# Patient Record
Sex: Female | Born: 1935 | Race: White | Hispanic: No | Marital: Married | Smoking: Never smoker
Health system: Southern US, Community
[De-identification: ages and names within clinical notes are randomized; demographics above are authoritative.]

## PROBLEM LIST (undated history)

## (undated) DIAGNOSIS — E039 Hypothyroidism, unspecified: Secondary | ICD-10-CM

## (undated) DIAGNOSIS — I10 Essential (primary) hypertension: Secondary | ICD-10-CM

## (undated) DIAGNOSIS — D649 Anemia, unspecified: Secondary | ICD-10-CM

## (undated) DIAGNOSIS — I350 Nonrheumatic aortic (valve) stenosis: Secondary | ICD-10-CM

## (undated) DIAGNOSIS — K8020 Calculus of gallbladder without cholecystitis without obstruction: Secondary | ICD-10-CM

## (undated) DIAGNOSIS — I4891 Unspecified atrial fibrillation: Secondary | ICD-10-CM

## (undated) DIAGNOSIS — E785 Hyperlipidemia, unspecified: Secondary | ICD-10-CM

## (undated) DIAGNOSIS — E669 Obesity, unspecified: Secondary | ICD-10-CM

## (undated) DIAGNOSIS — R413 Other amnesia: Secondary | ICD-10-CM

## (undated) DIAGNOSIS — I499 Cardiac arrhythmia, unspecified: Secondary | ICD-10-CM

## (undated) DIAGNOSIS — Z8719 Personal history of other diseases of the digestive system: Secondary | ICD-10-CM

## (undated) DIAGNOSIS — K219 Gastro-esophageal reflux disease without esophagitis: Secondary | ICD-10-CM

## (undated) DIAGNOSIS — E0590 Thyrotoxicosis, unspecified without thyrotoxic crisis or storm: Secondary | ICD-10-CM

## (undated) DIAGNOSIS — J302 Other seasonal allergic rhinitis: Secondary | ICD-10-CM

## (undated) DIAGNOSIS — M1990 Unspecified osteoarthritis, unspecified site: Secondary | ICD-10-CM

## (undated) DIAGNOSIS — G4730 Sleep apnea, unspecified: Secondary | ICD-10-CM

## (undated) HISTORY — DX: Nonrheumatic aortic (valve) stenosis: I35.0

## (undated) HISTORY — PX: BREAST SURGERY: SHX581

## (undated) HISTORY — PX: OTHER SURGICAL HISTORY: SHX169

## (undated) HISTORY — DX: Thyrotoxicosis, unspecified without thyrotoxic crisis or storm: E05.90

## (undated) HISTORY — DX: Gastro-esophageal reflux disease without esophagitis: K21.9

## (undated) HISTORY — DX: Unspecified osteoarthritis, unspecified site: M19.90

## (undated) HISTORY — DX: Other seasonal allergic rhinitis: J30.2

## (undated) HISTORY — DX: Cardiac arrhythmia, unspecified: I49.9

## (undated) HISTORY — DX: Essential (primary) hypertension: I10

## (undated) HISTORY — DX: Other amnesia: R41.3

---

## 1959-01-09 HISTORY — PX: THYROIDECTOMY: SHX17

## 1997-05-11 ENCOUNTER — Ambulatory Visit (HOSPITAL_COMMUNITY): Admission: RE | Admit: 1997-05-11 | Discharge: 1997-05-11 | Payer: Self-pay | Admitting: *Deleted

## 1997-07-07 ENCOUNTER — Other Ambulatory Visit: Admission: RE | Admit: 1997-07-07 | Discharge: 1997-07-07 | Payer: Self-pay | Admitting: *Deleted

## 1998-08-03 ENCOUNTER — Other Ambulatory Visit: Admission: RE | Admit: 1998-08-03 | Discharge: 1998-08-03 | Payer: Self-pay | Admitting: *Deleted

## 1998-08-04 ENCOUNTER — Other Ambulatory Visit: Admission: RE | Admit: 1998-08-04 | Discharge: 1998-08-04 | Payer: Self-pay | Admitting: *Deleted

## 1998-08-04 ENCOUNTER — Encounter (INDEPENDENT_AMBULATORY_CARE_PROVIDER_SITE_OTHER): Payer: Self-pay

## 1998-12-15 ENCOUNTER — Encounter: Payer: Self-pay | Admitting: Family Medicine

## 1998-12-15 ENCOUNTER — Encounter: Admission: RE | Admit: 1998-12-15 | Discharge: 1998-12-15 | Payer: Self-pay | Admitting: Family Medicine

## 1999-08-21 ENCOUNTER — Other Ambulatory Visit: Admission: RE | Admit: 1999-08-21 | Discharge: 1999-08-21 | Payer: Self-pay | Admitting: *Deleted

## 1999-12-18 ENCOUNTER — Encounter: Admission: RE | Admit: 1999-12-18 | Discharge: 1999-12-18 | Payer: Self-pay | Admitting: Family Medicine

## 1999-12-18 ENCOUNTER — Encounter: Payer: Self-pay | Admitting: Family Medicine

## 2000-09-19 ENCOUNTER — Other Ambulatory Visit: Admission: RE | Admit: 2000-09-19 | Discharge: 2000-09-19 | Payer: Self-pay | Admitting: Family Medicine

## 2000-12-20 ENCOUNTER — Encounter: Admission: RE | Admit: 2000-12-20 | Discharge: 2000-12-20 | Payer: Self-pay | Admitting: Family Medicine

## 2000-12-20 ENCOUNTER — Encounter: Payer: Self-pay | Admitting: Family Medicine

## 2001-06-26 ENCOUNTER — Ambulatory Visit (HOSPITAL_COMMUNITY): Admission: RE | Admit: 2001-06-26 | Discharge: 2001-06-26 | Payer: Self-pay | Admitting: Gastroenterology

## 2001-06-26 ENCOUNTER — Encounter (INDEPENDENT_AMBULATORY_CARE_PROVIDER_SITE_OTHER): Payer: Self-pay | Admitting: *Deleted

## 2001-11-28 ENCOUNTER — Encounter: Admission: RE | Admit: 2001-11-28 | Discharge: 2001-11-28 | Payer: Self-pay | Admitting: Family Medicine

## 2001-11-28 ENCOUNTER — Encounter: Payer: Self-pay | Admitting: Family Medicine

## 2001-12-24 ENCOUNTER — Encounter: Admission: RE | Admit: 2001-12-24 | Discharge: 2001-12-24 | Payer: Self-pay | Admitting: Family Medicine

## 2001-12-24 ENCOUNTER — Encounter: Payer: Self-pay | Admitting: Family Medicine

## 2002-03-23 ENCOUNTER — Encounter: Admission: RE | Admit: 2002-03-23 | Discharge: 2002-03-23 | Payer: Self-pay | Admitting: Family Medicine

## 2002-03-23 ENCOUNTER — Encounter: Payer: Self-pay | Admitting: Family Medicine

## 2002-12-31 ENCOUNTER — Encounter: Admission: RE | Admit: 2002-12-31 | Discharge: 2002-12-31 | Payer: Self-pay | Admitting: Family Medicine

## 2004-01-07 ENCOUNTER — Encounter: Admission: RE | Admit: 2004-01-07 | Discharge: 2004-01-07 | Payer: Self-pay | Admitting: Family Medicine

## 2005-01-25 ENCOUNTER — Encounter: Admission: RE | Admit: 2005-01-25 | Discharge: 2005-01-25 | Payer: Self-pay | Admitting: Family Medicine

## 2006-02-08 ENCOUNTER — Encounter: Admission: RE | Admit: 2006-02-08 | Discharge: 2006-02-08 | Payer: Self-pay | Admitting: Family Medicine

## 2007-02-26 ENCOUNTER — Encounter: Admission: RE | Admit: 2007-02-26 | Discharge: 2007-02-26 | Payer: Self-pay | Admitting: Family Medicine

## 2007-05-16 ENCOUNTER — Ambulatory Visit (HOSPITAL_COMMUNITY): Admission: RE | Admit: 2007-05-16 | Discharge: 2007-05-16 | Payer: Self-pay | Admitting: Cardiology

## 2007-06-19 ENCOUNTER — Ambulatory Visit (HOSPITAL_COMMUNITY): Admission: RE | Admit: 2007-06-19 | Discharge: 2007-06-19 | Payer: Self-pay | Admitting: Cardiology

## 2008-03-09 ENCOUNTER — Encounter: Admission: RE | Admit: 2008-03-09 | Discharge: 2008-03-09 | Payer: Self-pay | Admitting: Family Medicine

## 2009-03-15 ENCOUNTER — Encounter: Admission: RE | Admit: 2009-03-15 | Discharge: 2009-03-15 | Payer: Self-pay | Admitting: Family Medicine

## 2009-04-22 ENCOUNTER — Encounter: Admission: RE | Admit: 2009-04-22 | Discharge: 2009-04-22 | Payer: Self-pay | Admitting: Cardiology

## 2009-05-04 ENCOUNTER — Encounter: Payer: Self-pay | Admitting: Internal Medicine

## 2009-05-23 ENCOUNTER — Ambulatory Visit (HOSPITAL_COMMUNITY): Admission: RE | Admit: 2009-05-23 | Discharge: 2009-05-23 | Payer: Self-pay | Admitting: Cardiology

## 2009-05-23 ENCOUNTER — Encounter: Payer: Self-pay | Admitting: Internal Medicine

## 2009-05-30 ENCOUNTER — Encounter: Payer: Self-pay | Admitting: Internal Medicine

## 2009-06-15 ENCOUNTER — Encounter: Payer: Self-pay | Admitting: Internal Medicine

## 2009-06-15 DIAGNOSIS — D649 Anemia, unspecified: Secondary | ICD-10-CM

## 2009-06-15 DIAGNOSIS — I4891 Unspecified atrial fibrillation: Secondary | ICD-10-CM

## 2009-06-15 DIAGNOSIS — E669 Obesity, unspecified: Secondary | ICD-10-CM

## 2009-06-15 DIAGNOSIS — E785 Hyperlipidemia, unspecified: Secondary | ICD-10-CM

## 2009-06-15 DIAGNOSIS — I482 Chronic atrial fibrillation, unspecified: Secondary | ICD-10-CM | POA: Insufficient documentation

## 2009-06-15 DIAGNOSIS — I10 Essential (primary) hypertension: Secondary | ICD-10-CM

## 2009-06-15 DIAGNOSIS — E039 Hypothyroidism, unspecified: Secondary | ICD-10-CM | POA: Insufficient documentation

## 2009-06-15 HISTORY — DX: Hypothyroidism, unspecified: E03.9

## 2009-06-15 HISTORY — DX: Essential (primary) hypertension: I10

## 2009-06-15 HISTORY — DX: Obesity, unspecified: E66.9

## 2009-06-15 HISTORY — DX: Hyperlipidemia, unspecified: E78.5

## 2009-06-15 HISTORY — DX: Unspecified atrial fibrillation: I48.91

## 2009-06-15 HISTORY — DX: Anemia, unspecified: D64.9

## 2009-06-16 ENCOUNTER — Ambulatory Visit: Payer: Self-pay | Admitting: Internal Medicine

## 2009-06-16 DIAGNOSIS — G4730 Sleep apnea, unspecified: Secondary | ICD-10-CM

## 2009-06-16 HISTORY — DX: Sleep apnea, unspecified: G47.30

## 2009-07-12 ENCOUNTER — Ambulatory Visit: Payer: Self-pay | Admitting: Internal Medicine

## 2009-07-13 LAB — CONVERTED CEMR LAB
BUN: 13 mg/dL (ref 6–23)
Calcium: 9.1 mg/dL (ref 8.4–10.5)
Eosinophils Relative: 1.6 % (ref 0.0–5.0)
GFR calc non Af Amer: 66.55 mL/min (ref >60)
Hemoglobin: 12.4 g/dL (ref 12.0–15.0)
Lymphs Abs: 1.3 10*3/uL (ref 0.7–4.0)
MCHC: 34.5 g/dL (ref 30.0–36.0)
MCV: 84.5 fL (ref 78.0–100.0)
Monocytes Absolute: 0.7 10*3/uL (ref 0.1–1.0)
Platelets: 328 10*3/uL (ref 150.0–400.0)
Potassium: 4.6 meq/L (ref 3.5–5.1)
Prothrombin Time: 32.6 s — ABNORMAL HIGH (ref 9.7–11.8)
RBC: 4.25 M/uL (ref 3.87–5.11)
RDW: 15.9 % — ABNORMAL HIGH (ref 11.5–14.6)
Sodium: 125 meq/L — ABNORMAL LOW (ref 135–145)

## 2009-07-18 ENCOUNTER — Ambulatory Visit: Payer: Self-pay | Admitting: Cardiology

## 2009-07-18 ENCOUNTER — Ambulatory Visit (HOSPITAL_COMMUNITY): Admission: RE | Admit: 2009-07-18 | Discharge: 2009-07-18 | Payer: Self-pay | Admitting: Cardiology

## 2009-07-18 ENCOUNTER — Telehealth: Payer: Self-pay | Admitting: Internal Medicine

## 2009-07-19 ENCOUNTER — Ambulatory Visit: Payer: Self-pay | Admitting: Cardiology

## 2009-07-19 ENCOUNTER — Inpatient Hospital Stay (HOSPITAL_COMMUNITY): Admission: EM | Admit: 2009-07-19 | Discharge: 2009-08-02 | Payer: Self-pay | Admitting: Emergency Medicine

## 2009-07-19 ENCOUNTER — Ambulatory Visit: Payer: Self-pay | Admitting: Internal Medicine

## 2009-08-22 ENCOUNTER — Ambulatory Visit: Payer: Self-pay | Admitting: Internal Medicine

## 2010-02-07 NOTE — Assessment & Plan Note (Signed)
Summary: NEP/ATRIAL FIB/HTN/HYPERLIPIDEMIA/jml   Visit Type:  Initial Consult Referring Provider:  Viann Fish, MD Primary Provider:  [name], MD  CC:  afib.  History of Present Illness: Erika [name] is a pleasant 77 yo WF with a h/o  HTN, obesity, and persistent atrial fibrillation who presents today for EP consultation regarding her atrial fibrillation.  She was initially diagnosed with atrial fibrillation 2008 after presenting to an urgent care clinic with "pneumonia".  She was referred to Dr Jacinto Halim.  She was cardioverted 5/09 but did not maintain sinus rhythm.  She was initiated on flecainide and cardioverted again 6/09.  She had returned to afib before return for office visit.  She was subsequently evaluated by Dr Donnie Aho.  She was loaded with amiodarone and subsequently cardioverted 05/24/09.  She reports feeling "good" the following week.  Even though she felt better, she was found to have returned to afib upon return.  During sinus rhythm, she was noted to have bradycardia.  Her atenolol has been decreased and her heart rate has improved. She reports symptoms of fatigue and decreased exercise tolerance.  She denies palpitations, CP, SOB, orthopnea, PND, presyncope, or syncope.  Current Medications (verified): 1)  Warfarin Sodium 5 Mg Tabs (Warfarin Sodium) .... Use As Directed By Anticoagulation Clinic 2)  Lisinopril 10 Mg Tabs (Lisinopril) .... Take 3 Tablets By Mouth Daily 3)  Synthroid 175 Mcg Tabs (Levothyroxine Sodium) .... Take One Tablet By Mouth Once Daily. 4)  Potassium Chloride Cr 10 Meq Cr-Caps (Potassium Chloride) .... Take One Tablet By Mouth Daily 5)  Clarinex 5 Mg Tabs (Desloratadine) .... Take One Tablet By Mouth Once Daily. 6)  Vitamin D (Ergocalciferol) 50000 Unit Caps (Ergocalciferol) .... Weekly 7)  Lovaza 1 Gm Caps (Omega-3-Acid Ethyl Esters) .Marland Kitchen.. 1 Capsule Two Times A Day 8)  Hydrochlorothiazide 25 Mg Tabs (Hydrochlorothiazide) .... Take One Tablet By Mouth  Daily.  Allergies (verified): 1)  ! Celebrex 2)  ! Sulfa 3)  ! Asa  Past History:  Past Medical History: OBESITY (ICD-278.00) HTN Persistent atrial fibrillation Hypothryoidism She reports having a diagnosis of sleep apnea, but is noncompliant with CPAP.  Past Surgical History: thyroidectomy 1961  Family History: father had CAD she suspects that her mother may have had a stroke  Social History: LIves in [place] with spouse.  Retired from [employer].   Denies TED  Review of Systems       All systems are reviewed and negative except as listed in the HPI.   In addition, she snores.    Vital Signs:  Patient profile:   77 year old female Height:      67 inches Weight:      218 pounds BMI:     34.27 Pulse rate:   67 / minute BP sitting:   134 / 66  (left arm)  Vitals Entered By: Laurance Flatten CMA (June 16, 2009 8:33 AM)  Physical Exam  General:  overweight Head:  normocephalic and atraumatic Eyes:  PERRLA/EOM intact; conjunctiva and lids normal. Nose:  no deformity, discharge, inflammation, or lesions Mouth:  Teeth, gums and palate normal. Oral mucosa normal. Neck:  supple, JVP 9cm Lungs:  Clear bilaterally to auscultation and percussion. Heart:  iRRR, no m/r/g Abdomen:  Bowel sounds positive; abdomen soft and non-tender without masses, organomegaly, or hernias noted. No hepatosplenomegaly. Msk:  Back normal, normal gait. Muscle strength and tone normal. Pulses:  pulses normal in all 4 extremities Extremities:  No clubbing or cyanosis. Neurologic:  Alert  and oriented x 3. Skin:  Intact without lesions or rashes. Cervical Nodes:  no significant adenopathy Psych:  Normal affect.   EKG  Procedure date:  06/16/2009  Findings:      afib, V rate 67  otherwise normal ekg  Echocardiogram  Procedure date:  05/04/2009  Findings:      LVEF 50%,  nl LV size.  LA size 40cm.  mild AI.  Mild MR.  Mild PHTN.  Impression & Recommendations:  Problem # 1:  ATRIAL  FIBRILLATION (ICD-427.31) The patient has very symptomatic persistent atrial fibrillation, refractory to flecainide and amiodarone.  Therapeutic strategies for afib including medicine and ablation were discussed in detail with the patient today. Risk, benefits, and alternatives to EP study and radiofrequency ablation for afib were also discussed in detail today. These risks include but are not limited to stroke, bleeding, vascular damage, tamponade, perforation, damage to the esophagus, lungs, and other structures, pulmonary vein stenosis, worsening renal function, and death.  Given her poor response to medical therapy, I have informed her that I believe that her anticipated success rates with ablation are 50-60% and will likeliy require more than 1 procedure. The patient understands these risk and wishes to proceed.  We will therefore schedule afib ablation at the next available time. I have instructed the patient to begin CPAP for OSA as part of our strategy for maintaining sinus rhythm.  Problem # 2:  HYPERTENSION (ICD-401.9) stable today  Problem # 3:  SLEEP APNEA (ICD-780.57) She has been noncompliant with CPAP. I have encouraged compliace today  Other Orders: Ablation (Ablation)  Patient Instructions: 1)  You have been diagnosed with atrial fibrillation.  Atrial fibrillation is a condition in which one of the upper chambers of the heart has extra electrical cells causing it to beat very fast.  Please see the handout/brochure given to you today for further information. 2)  Your physician has recommended that you have an ablation.  Catheter ablation is a medical procedure used to treat some cardiac arrhythmias (irregular heartbeats). During catheter ablation, a long, thin, flexible tube is put into a blood vessel in your groin (upper thigh), or neck. This tube is called an ablation catheter. It is then guided to your heart through the blood vessel. Radiofrequency waves destroy small areas of  heart tissue where abnormal heartbeats may cause an arrhythmia to start.  Please see the instruction sheet given to you today. 3)  Your physician has requested that you have a TEE.  During a TEE, sound waves are used to create images of your heart. It provides your doctor with information about the size and shape of your heart and how well your heart's chambers and valves are working. In this test, a transducer is attached to the end of a flexible tube that's guided down your throat and into your esophagus (the tube leading from your mouth to your stomach) to get a more detailed image of your heart. You are not awake for the procedure. Please see the instruction sheet given to you today.  For further information please visit [email or web address]

## 2010-02-07 NOTE — Assessment & Plan Note (Signed)
Summary: rov/afib/per kelly   Referring Chantelle Verdi:  Viann Fish, MD Primary Zion Lint:  [name], MD   History of Present Illness: The patient presents today for electrophysiology followup. She reports doing very well since her recent hospital discharge after esophageal perforation.  She has resumed her normal diet and has been released by ENT.  Her incision is healing niceliy. Her afib is stable, with stable decreased exercise tolerance.  The patient denies symptoms of chest pain, shortness of breath, orthopnea, PND, lower extremity edema, dizziness, presyncope, syncope, or neurologic sequela. The patient is tolerating medications without difficulties and is otherwise without complaint today.   Current Medications (verified): 1)  Warfarin Sodium 5 Mg Tabs (Warfarin Sodium) .... Use As Directed By Anticoagulation Clinic 2)  Lisinopril 10 Mg Tabs (Lisinopril) .... Take 3 Tablets By Mouth Daily 3)  Synthroid 175 Mcg Tabs (Levothyroxine Sodium) .... Take One Tablet By Mouth Once Daily. 4)  Potassium Chloride Cr 10 Meq Cr-Caps (Potassium Chloride) .... Take One Tablet By Mouth Daily 5)  Clarinex 5 Mg Tabs (Desloratadine) .... Take One Tablet By Mouth Once Daily. 6)  Vitamin D (Ergocalciferol) 50000 Unit Caps (Ergocalciferol) .... Weekly 7)  Lovaza 1 Gm Caps (Omega-3-Acid Ethyl Esters) .Marland Kitchen.. 1 Capsule Two Times A Day 8)  Omeprazole 20 Mg Cpdr (Omeprazole) .... 2 Caps Daily  Allergies: 1)  ! Celebrex 2)  ! Sulfa 3)  ! Asa  Past History:  Past Medical History: OBESITY (ICD-278.00) HTN Persistent atrial fibrillation Hypothryoidism She reports having a diagnosis of sleep apnea, but is noncompliant with CPAP. s/p surgical repair following perforation hypopharynx with TEE 7/11  Past Surgical History: thyroidectomy 1961 hypopharynx repair s/p perforation with TEE 7/11  Social History: Reviewed history from 06/16/2009 and no changes required. LIves in [place] with spouse.  Retired  from [employer].   Denies TED  Vital Signs:  Patient profile:   77 year old female Height:      67 inches Weight:      207 pounds BMI:     32.54 Pulse rate:   89 / minute Pulse rhythm:   irregularly irregular BP sitting:   116 / 62  (left arm)  Vitals Entered By: Laurance Flatten CMA (August 22, 2009 4:33 PM)  Physical Exam  General:  Well developed, well nourished, in no acute distress. Head:  normocephalic and atraumatic Eyes:  PERRLA/EOM intact; conjunctiva and lids normal. Mouth:  Teeth, gums and palate normal. Oral mucosa normal. Neck:  supple, 2cm R cervical scar is healing nicely Lungs:  Clear bilaterally to auscultation and percussion. Heart:  iRRR, no m/r/g Abdomen:  Bowel sounds positive; abdomen soft and non-tender without masses, organomegaly, or hernias noted. No hepatosplenomegaly. Msk:  Back normal, normal gait. Muscle strength and tone normal. Pulses:  pulses normal in all 4 extremities Extremities:  No clubbing or cyanosis. Neurologic:  Alert and oriented x 3.   EKG  Procedure date:  08/22/2009  Findings:      afib,  V rate 90 bpm,  Impression & Recommendations:  Problem # 1:  ATRIAL FIBRILLATION (ICD-427.31) stable The patient and I had a long discussion today about treatment for her afib.   She and I agree to a rate control strategy longterm.  She should continue coumadin long term for stroke prevention. She will follow-up with Dr Donnie Aho.  I will see her as needed.

## 2010-02-07 NOTE — Progress Notes (Signed)
Summary: pt couldn't do testing today / c/o neck swollen  Phone Note Call from Patient Call back at Home Phone [phone]   Caller: Patient Reason for Call: Talk to Nurse, Talk to Doctor Summary of Call: pt couldn't do all of testing today due to acid reflux and wants to know what se needs to do about appt tomorrow Initial call taken by: Omer Jack,  July 18, 2009 2:52 PM  Follow-up for Phone Call        cancel ablation and will get set up for GI Dennis Bast, RN, BSN  July 18, 2009 4:39 PM  per pt daughter called, mom had procedure yesterday with dr. Shirlee Latch on yesterday.  c/o neck swollen,  raspy voice, breathing souding like " crackling". is this normal. could she get something for discomfort. need to be in liquid form. [phone] sandy Lorne Skeens  July 19, 2009 8:30 AM   Additional Follow-up for Phone Call Additional follow up Details #1::        Pleases follow-up today with patient. If she continues to have problems with her neck, voice, or breathing, please let me know Additional Follow-up by: Hillis Range, MD,  July 19, 2009 9:31 AM    Additional Follow-up for Phone Call Additional follow up Details #2::    pt having trouble getting water down.  spoke with husbsand neck is painful and mild swelling.  After discussing with both Dr Johney Frame and Dr Shirlee Latch advised pt to go to the ER.  Trish awre and will have GI evaluate there. Dennis Bast, RN, BSN  July 19, 2009 10:09 AM

## 2010-02-07 NOTE — Letter (Signed)
Summary: TEE Instructions  [facility], Main Office  [address]   [place], [place] [address]   Phone: [phone]  Fax: [phone]      TEE Instructions    You are scheduled for a TEE on MONDAY, July 18, 2009 with Dr. Shirlee Latch.  Please arrive at the [facility] at 10 a.m. on the day of your procedure.  1)   Diet:     A)   Nothing to eat or drink after midnight except your medications with        a sip of water.   2)  Must have a responsible person to drive you home.  3)   Bring your current insurance cards and current list of all your medications.   *Special Note:  Every effort is made to have your procedure done on time.  Occasionally there are emergencies that present themselves at the hospital that may cause delays.  Please be patient if a delay does occur.  *If you have any questions after you get home, please call the office at [phone].

## 2010-02-07 NOTE — Letter (Signed)
Summary: ELectrophysiology/Ablation Procedure Instructions  [facility], Main Office  [address]   [place], [place] [address]   Phone: [phone]  Fax: [phone]     Electrophysiology/Ablation Procedure Instructions    You are scheduled for a(n) AFIB ABLATION on TUESDAY, July 19, 2009 at 7:30 AM with Dr. Johney Frame.  1.  Please come to the Short Stay Center at [facility] at 5:30 AM on the day of your procedure.  2.  Come prepared to stay overnight.   Please bring your insurance cards and a list of your medications.  3.  Come to the [place] office on July 12, 2009 for lab work.  The lab at [facility] is open from 8:30 AM to 1:30 PM and 2:30 PM to 5:00 PM.  The lab at [facility] is open from 7:30 AM to 5:30 PM.  You do not have to be fasting.  4.  Do not have anything to eat or drink after midnight the night before your procedure.  5.  All of your medications may be taken with a small amount of water.     * Occasionally, EP studies and ablations can become lengthy.  Please make your family aware of this before your procedure starts.  Average time ranges from 2-8 hours for EP studies/ablations.  Your physician will locate your family after the procedure with the results.  * If you have any questions after you get home, please call the office at [phone].

## 2010-02-07 NOTE — Consult Note (Signed)
Summary: Dr. Georga Hacking  Dr. Georga Hacking   Imported By: Marylou Mccoy 07/14/2009 15:14:39  _____________________________________________________________________  External Attachment:    Type:   Image     Comment:   External Document

## 2010-03-23 ENCOUNTER — Other Ambulatory Visit: Payer: Self-pay | Admitting: Family Medicine

## 2010-03-23 DIAGNOSIS — Z1231 Encounter for screening mammogram for malignant neoplasm of breast: Secondary | ICD-10-CM

## 2010-03-25 LAB — BASIC METABOLIC PANEL
BUN: 9 mg/dL (ref 6–23)
CO2: 28 mEq/L (ref 19–32)
CO2: 30 mEq/L (ref 19–32)
Calcium: 8.9 mg/dL (ref 8.4–10.5)
Calcium: 8.9 mg/dL (ref 8.4–10.5)
Calcium: 9.2 mg/dL (ref 8.4–10.5)
Calcium: 9.3 mg/dL (ref 8.4–10.5)
Calcium: 9.5 mg/dL (ref 8.4–10.5)
Chloride: 101 mEq/L (ref 96–112)
Chloride: 94 mEq/L — ABNORMAL LOW (ref 96–112)
Creatinine, Ser: 0.84 mg/dL (ref 0.4–1.2)
Creatinine, Ser: 0.85 mg/dL (ref 0.4–1.2)
Creatinine, Ser: 0.87 mg/dL (ref 0.4–1.2)
Creatinine, Ser: 1.03 mg/dL (ref 0.4–1.2)
GFR calc Af Amer: >60 mL/min (ref >60)
GFR calc Af Amer: >60 mL/min (ref >60)
GFR calc Af Amer: >60 mL/min (ref >60)
GFR calc Af Amer: >60 mL/min (ref >60)
GFR calc Af Amer: >60 mL/min (ref >60)
GFR calc Af Amer: >60 mL/min (ref >60)
GFR calc non Af Amer: 52 mL/min — ABNORMAL LOW (ref >60)
GFR calc non Af Amer: >60 mL/min (ref >60)
GFR calc non Af Amer: >60 mL/min (ref >60)
GFR calc non Af Amer: >60 mL/min (ref >60)
Glucose, Bld: 114 mg/dL — ABNORMAL HIGH (ref 70–99)
Potassium: 3.1 mEq/L — ABNORMAL LOW (ref 3.5–5.1)
Potassium: 3.7 mEq/L (ref 3.5–5.1)
Potassium: 4.6 mEq/L (ref 3.5–5.1)
Sodium: 138 mEq/L (ref 135–145)
Sodium: 140 mEq/L (ref 135–145)
Sodium: 143 mEq/L (ref 135–145)
Sodium: 144 mEq/L (ref 135–145)

## 2010-03-25 LAB — PROTIME-INR
INR: 1.47 (ref 0.00–1.49)
INR: 1.53 — ABNORMAL HIGH (ref 0.00–1.49)
INR: 3.31 — ABNORMAL HIGH (ref 0.00–1.49)
INR: 5.65 (ref 0.00–1.49)
Prothrombin Time: 18.4 seconds — ABNORMAL HIGH (ref 11.6–15.2)
Prothrombin Time: 19.8 seconds — ABNORMAL HIGH (ref 11.6–15.2)
Prothrombin Time: 21 seconds — ABNORMAL HIGH (ref 11.6–15.2)
Prothrombin Time: 33.4 seconds — ABNORMAL HIGH (ref 11.6–15.2)
Prothrombin Time: 50.7 seconds — ABNORMAL HIGH (ref 11.6–15.2)

## 2010-03-25 LAB — CBC
HCT: 34.2 % — ABNORMAL LOW (ref 36.0–46.0)
HCT: 36.2 % (ref 36.0–46.0)
Hemoglobin: 11.3 g/dL — ABNORMAL LOW (ref 12.0–15.0)
Hemoglobin: 11.4 g/dL — ABNORMAL LOW (ref 12.0–15.0)
MCH: 29 pg (ref 26.0–34.0)
MCH: 29.2 pg (ref 26.0–34.0)
MCHC: 32.6 g/dL (ref 30.0–36.0)
MCHC: 33.9 g/dL (ref 30.0–36.0)
MCV: 86.2 fL (ref 78.0–100.0)
Platelets: 210 10*3/uL (ref 150–400)
Platelets: 225 10*3/uL (ref 150–400)
Platelets: 237 10*3/uL (ref 150–400)
Platelets: 262 10*3/uL (ref 150–400)
RBC: 3.79 MIL/uL — ABNORMAL LOW (ref 3.87–5.11)
RBC: 3.84 MIL/uL — ABNORMAL LOW (ref 3.87–5.11)
RBC: 3.92 MIL/uL (ref 3.87–5.11)
RBC: 3.99 MIL/uL (ref 3.87–5.11)
RBC: 4.04 MIL/uL (ref 3.87–5.11)
RBC: 4.2 MIL/uL (ref 3.87–5.11)
RDW: 16.3 % — ABNORMAL HIGH (ref 11.5–15.5)
WBC: 10.4 10*3/uL (ref 4.0–10.5)
WBC: 10.8 10*3/uL — ABNORMAL HIGH (ref 4.0–10.5)
WBC: 11.1 10*3/uL — ABNORMAL HIGH (ref 4.0–10.5)
WBC: 11.6 10*3/uL — ABNORMAL HIGH (ref 4.0–10.5)
WBC: 9.8 10*3/uL (ref 4.0–10.5)

## 2010-03-25 LAB — DIFFERENTIAL
Basophils Absolute: 0 10*3/uL (ref 0.0–0.1)
Basophils Relative: 0 % (ref 0–1)
Basophils Relative: 0 % (ref 0–1)
Basophils Relative: 1 % (ref 0–1)
Eosinophils Absolute: 0.4 10*3/uL (ref 0.0–0.7)
Eosinophils Absolute: 0.4 10*3/uL (ref 0.0–0.7)
Lymphocytes Relative: 14 % (ref 12–46)
Lymphocytes Relative: 15 % (ref 12–46)
Lymphocytes Relative: 18 % (ref 12–46)
Lymphs Abs: 1.4 10*3/uL (ref 0.7–4.0)
Lymphs Abs: 1.9 10*3/uL (ref 0.7–4.0)
Lymphs Abs: 1.9 10*3/uL (ref 0.7–4.0)
Monocytes Relative: 11 % (ref 3–12)
Neutro Abs: 7.7 10*3/uL (ref 1.7–7.7)
Neutrophils Relative %: 66 % (ref 43–77)
Neutrophils Relative %: 68 % (ref 43–77)
Neutrophils Relative %: 70 % (ref 43–77)
Neutrophils Relative %: 72 % (ref 43–77)

## 2010-03-25 LAB — MAGNESIUM
Magnesium: 1.7 mg/dL (ref 1.5–2.5)
Magnesium: 1.9 mg/dL (ref 1.5–2.5)
Magnesium: 1.9 mg/dL (ref 1.5–2.5)

## 2010-03-25 LAB — GLUCOSE, CAPILLARY
Glucose-Capillary: 109 mg/dL — ABNORMAL HIGH (ref 70–99)
Glucose-Capillary: 113 mg/dL — ABNORMAL HIGH (ref 70–99)
Glucose-Capillary: 113 mg/dL — ABNORMAL HIGH (ref 70–99)
Glucose-Capillary: 119 mg/dL — ABNORMAL HIGH (ref 70–99)
Glucose-Capillary: 127 mg/dL — ABNORMAL HIGH (ref 70–99)
Glucose-Capillary: 132 mg/dL — ABNORMAL HIGH (ref 70–99)
Glucose-Capillary: 135 mg/dL — ABNORMAL HIGH (ref 70–99)
Glucose-Capillary: 147 mg/dL — ABNORMAL HIGH (ref 70–99)

## 2010-03-25 LAB — PHOSPHORUS
Phosphorus: 3.1 mg/dL (ref 2.3–4.6)
Phosphorus: 3.6 mg/dL (ref 2.3–4.6)
Phosphorus: 3.7 mg/dL (ref 2.3–4.6)
Phosphorus: 3.9 mg/dL (ref 2.3–4.6)

## 2010-03-25 LAB — VANCOMYCIN, TROUGH: Vancomycin Tr: 10.4 ug/mL (ref 10.0–20.0)

## 2010-03-26 LAB — APTT
aPTT: 34 seconds (ref 24–37)
aPTT: 39 seconds — ABNORMAL HIGH (ref 24–37)

## 2010-03-26 LAB — POCT I-STAT, CHEM 8
BUN: 9 mg/dL (ref 6–23)
Calcium, Ion: 1.1 mmol/L — ABNORMAL LOW (ref 1.12–1.32)
Chloride: 99 mEq/L (ref 96–112)
Creatinine, Ser: 1 mg/dL (ref 0.4–1.2)
TCO2: 24 mmol/L (ref 0–100)

## 2010-03-26 LAB — PREPARE FRESH FROZEN PLASMA

## 2010-03-26 LAB — BASIC METABOLIC PANEL
BUN: 9 mg/dL (ref 6–23)
CO2: 25 mEq/L (ref 19–32)
Chloride: 103 mEq/L (ref 96–112)
Chloride: 96 mEq/L (ref 96–112)
Chloride: 97 mEq/L (ref 96–112)
Creatinine, Ser: 0.99 mg/dL (ref 0.4–1.2)
GFR calc Af Amer: >60 mL/min (ref >60)
GFR calc Af Amer: >60 mL/min (ref >60)
Glucose, Bld: 106 mg/dL — ABNORMAL HIGH (ref 70–99)
Potassium: 3.3 mEq/L — ABNORMAL LOW (ref 3.5–5.1)
Potassium: 3.7 mEq/L (ref 3.5–5.1)
Potassium: 4.3 mEq/L (ref 3.5–5.1)
Sodium: 131 mEq/L — ABNORMAL LOW (ref 135–145)

## 2010-03-26 LAB — CBC
HCT: 31.1 % — ABNORMAL LOW (ref 36.0–46.0)
HCT: 34.3 % — ABNORMAL LOW (ref 36.0–46.0)
HCT: 42.2 % (ref 36.0–46.0)
Hemoglobin: 10.3 g/dL — ABNORMAL LOW (ref 12.0–15.0)
Hemoglobin: 14.1 g/dL (ref 12.0–15.0)
MCH: 28.7 pg (ref 26.0–34.0)
MCV: 86.4 fL (ref 78.0–100.0)
MCV: 87.3 fL (ref 78.0–100.0)
MCV: 87.4 fL (ref 78.0–100.0)
Platelets: 254 10*3/uL (ref 150–400)
RBC: 3.56 MIL/uL — ABNORMAL LOW (ref 3.87–5.11)
RBC: 4.89 MIL/uL (ref 3.87–5.11)
RDW: 16.4 % — ABNORMAL HIGH (ref 11.5–15.5)
WBC: 15.3 10*3/uL — ABNORMAL HIGH (ref 4.0–10.5)
WBC: 16.7 10*3/uL — ABNORMAL HIGH (ref 4.0–10.5)
WBC: 26.8 10*3/uL — ABNORMAL HIGH (ref 4.0–10.5)

## 2010-03-26 LAB — PROTIME-INR
INR: 2.26 — ABNORMAL HIGH (ref 0.00–1.49)
INR: 2.72 — ABNORMAL HIGH (ref 0.00–1.49)
Prothrombin Time: 28.6 seconds — ABNORMAL HIGH (ref 11.6–15.2)

## 2010-03-26 LAB — POCT I-STAT 3, ART BLOOD GAS (G3+)
Acid-Base Excess: 1 mmol/L (ref 0.0–2.0)
Acid-Base Excess: 3 mmol/L — ABNORMAL HIGH (ref 0.0–2.0)
Bicarbonate: 24.8 mEq/L — ABNORMAL HIGH (ref 20.0–24.0)
Bicarbonate: 28.7 mEq/L — ABNORMAL HIGH (ref 20.0–24.0)
Patient temperature: 98.9
pO2, Arterial: 93 mmHg (ref 80.0–100.0)

## 2010-03-26 LAB — CULTURE, BAL-QUANTITATIVE W GRAM STAIN

## 2010-03-26 LAB — CK TOTAL AND CKMB (NOT AT ARMC)
CK, MB: 1.5 ng/mL (ref 0.3–4.0)
Relative Index: INVALID (ref 0.0–2.5)
Total CK: 22 U/L (ref 7–177)

## 2010-03-26 LAB — DIFFERENTIAL
Basophils Relative: 1 % (ref 0–1)
Eosinophils Relative: 0 % (ref 0–5)
Lymphs Abs: 0.8 10*3/uL (ref 0.7–4.0)
Monocytes Absolute: 1.1 10*3/uL — ABNORMAL HIGH (ref 0.1–1.0)
Monocytes Relative: 4 % (ref 3–12)

## 2010-03-26 LAB — MRSA PCR SCREENING: MRSA by PCR: NEGATIVE

## 2010-03-26 LAB — GRAM STAIN

## 2010-03-26 LAB — TYPE AND SCREEN
ABO/RH(D): O POS
Antibody Screen: NEGATIVE

## 2010-03-26 LAB — GLUCOSE, CAPILLARY: Glucose-Capillary: 119 mg/dL — ABNORMAL HIGH (ref 70–99)

## 2010-03-26 LAB — CULTURE, ROUTINE-ABSCESS

## 2010-03-27 LAB — APTT: aPTT: 51 seconds — ABNORMAL HIGH (ref 24–37)

## 2010-04-20 ENCOUNTER — Ambulatory Visit
Admission: RE | Admit: 2010-04-20 | Discharge: 2010-04-20 | Disposition: A | Discharge status: Home / Self Care | Payer: Medicare Other | Source: Ambulatory Visit | Attending: Family Medicine | Admitting: Family Medicine

## 2010-04-20 DIAGNOSIS — Z1231 Encounter for screening mammogram for malignant neoplasm of breast: Secondary | ICD-10-CM

## 2010-04-21 ENCOUNTER — Other Ambulatory Visit: Payer: Self-pay | Admitting: Family Medicine

## 2010-04-21 DIAGNOSIS — R928 Other abnormal and inconclusive findings on diagnostic imaging of breast: Secondary | ICD-10-CM

## 2010-05-02 ENCOUNTER — Other Ambulatory Visit: Payer: Self-pay | Admitting: Family Medicine

## 2010-05-02 ENCOUNTER — Ambulatory Visit
Admission: RE | Admit: 2010-05-02 | Discharge: 2010-05-02 | Disposition: A | Discharge status: Home / Self Care | Payer: Medicare Other | Source: Ambulatory Visit | Attending: Family Medicine | Admitting: Family Medicine

## 2010-05-02 ENCOUNTER — Other Ambulatory Visit: Payer: Self-pay | Admitting: Diagnostic Radiology

## 2010-05-02 DIAGNOSIS — R928 Other abnormal and inconclusive findings on diagnostic imaging of breast: Secondary | ICD-10-CM

## 2010-05-12 ENCOUNTER — Other Ambulatory Visit: Payer: Self-pay | Admitting: General Surgery

## 2010-05-12 DIAGNOSIS — R928 Other abnormal and inconclusive findings on diagnostic imaging of breast: Secondary | ICD-10-CM

## 2010-05-23 NOTE — Op Note (Signed)
NAMENARCISA, GANESH               ACCOUNT NO.:  [id number]   MEDICAL RECORD NO.:  [id number]          PATIENT TYPE:  OIB   LOCATION:  2899                         FACILITY:  [facility]   PHYSICIAN:  Cristy Hilts. Jacinto Halim, MD       DATE OF BIRTH:  1932/03/25   DATE OF PROCEDURE:  05/16/2007  DATE OF DISCHARGE:                               OPERATIVE REPORT   PROCEDURE PERFORMED:  Direct current cardioversion.   INDICATIONS:  Ms. Erika [name] is a 77 year old female who had new  onset of atrial fibrillation.  She was well anticoagulated with Coumadin  for 4 weeks.  Due to marked lethargy and new onset of atrial  fibrillation, we felt that proceeding with direct current cardioversion  was indicated.  [facility] and  using help of anesthesia and administering 250 mg of  Pentothal for deep  sedation, 100 joules of synchronized direct current cardioversion was  delivered to the patient with successful cardioversion from atrial  fibrillation to normal sinus rhythm.   The patient tolerated the procedure well.   COMPLICATIONS:  No immediate complications noted.      Cristy Hilts. Jacinto Halim, MD  Electronically Signed     JRG/MEDQ  D:  05/16/2007  T:  05/17/2007  Job:  [id number]

## 2010-05-23 NOTE — Op Note (Signed)
NAMEMICAL, BRUN               ACCOUNT NO.:  [id number]   MEDICAL RECORD NO.:  [id number]          PATIENT TYPE:  OIB   LOCATION:  2853                         FACILITY:  [facility]   PHYSICIAN:  Cristy Hilts. Jacinto Halim, MD       DATE OF BIRTH:  August 26, 1932   DATE OF PROCEDURE:  06/19/2007  DATE OF DISCHARGE:                               OPERATIVE REPORT   PROCEDURE PERFORMED:  Direct current cardioversion for atrial  fibrillation.   INDICATIONS:  Erika [name] is a 77 year old female who has paroxysmal  atrial fibrillation, now has gone into permanent atrial fibrillation.  She is complaining of extreme fatigue.  Because of this, she had  undergone unsuccessful cardioversion without any antiarrhythmic therapy,  however, she reverted back to atrial fibrillation.  Now, she has been  placed on flecainide and now brought back for repeat cardioversion.   Using 175 mg of Pentothol for achieving deep sedation, synchronized  direct current cardioversion was done using a biphasic defibrillator  with 75 Joules and then 100 Joules with successful cardioversion for  atrial fibrillation to normal sinus rhythm.  The patient tolerated the  procedure well.  No immediate complications noted.      Cristy Hilts. Jacinto Halim, MD  Electronically Signed     JRG/MEDQ  D:  06/19/2007  T:  06/20/2007  Job:  [id number]

## 2010-06-16 ENCOUNTER — Encounter (HOSPITAL_COMMUNITY)
Admission: RE | Admit: 2010-06-16 | Discharge: 2010-06-16 | Disposition: A | Discharge status: Home / Self Care | Payer: Medicare Other | Source: Ambulatory Visit | Attending: General Surgery | Admitting: General Surgery

## 2010-06-16 ENCOUNTER — Other Ambulatory Visit (HOSPITAL_COMMUNITY): Payer: Self-pay | Admitting: General Surgery

## 2010-06-16 DIAGNOSIS — C50911 Malignant neoplasm of unspecified site of right female breast: Secondary | ICD-10-CM

## 2010-06-16 LAB — CBC
HCT: 36.4 % (ref 36.0–46.0)
Hemoglobin: 12.2 g/dL (ref 12.0–15.0)
WBC: 8.5 10*3/uL (ref 4.0–10.5)

## 2010-06-16 LAB — BASIC METABOLIC PANEL
BUN: 12 mg/dL (ref 6–23)
Chloride: 88 mEq/L — ABNORMAL LOW (ref 96–112)
GFR calc Af Amer: >60 mL/min (ref >60)
Glucose, Bld: 99 mg/dL (ref 70–99)
Potassium: 4.3 mEq/L (ref 3.5–5.1)

## 2010-06-16 LAB — SURGICAL PCR SCREEN: MRSA, PCR: NEGATIVE

## 2010-06-16 LAB — PROTIME-INR: INR: 2.04 — ABNORMAL HIGH (ref 0.00–1.49)

## 2010-06-21 ENCOUNTER — Ambulatory Visit
Admission: RE | Admit: 2010-06-21 | Discharge: 2010-06-21 | Disposition: A | Discharge status: Home / Self Care | Payer: Medicare Other | Source: Ambulatory Visit | Attending: General Surgery | Admitting: General Surgery

## 2010-06-21 ENCOUNTER — Other Ambulatory Visit (INDEPENDENT_AMBULATORY_CARE_PROVIDER_SITE_OTHER): Payer: Self-pay | Admitting: General Surgery

## 2010-06-21 ENCOUNTER — Ambulatory Visit (HOSPITAL_COMMUNITY)
Admission: RE | Admit: 2010-06-21 | Discharge: 2010-06-21 | Disposition: A | Discharge status: Home / Self Care | Payer: Medicare Other | Source: Ambulatory Visit | Attending: General Surgery | Admitting: General Surgery

## 2010-06-21 DIAGNOSIS — I10 Essential (primary) hypertension: Secondary | ICD-10-CM | POA: Insufficient documentation

## 2010-06-21 DIAGNOSIS — Z79899 Other long term (current) drug therapy: Secondary | ICD-10-CM | POA: Insufficient documentation

## 2010-06-21 DIAGNOSIS — R928 Other abnormal and inconclusive findings on diagnostic imaging of breast: Secondary | ICD-10-CM

## 2010-06-21 DIAGNOSIS — Z7901 Long term (current) use of anticoagulants: Secondary | ICD-10-CM | POA: Insufficient documentation

## 2010-06-21 DIAGNOSIS — N6029 Fibroadenosis of unspecified breast: Secondary | ICD-10-CM | POA: Insufficient documentation

## 2010-06-21 DIAGNOSIS — I4891 Unspecified atrial fibrillation: Secondary | ICD-10-CM | POA: Insufficient documentation

## 2010-07-10 NOTE — Op Note (Signed)
  [name], [name]               ACCOUNT NO.:  [id number]  MEDICAL RECORD NO.:  [id number]  LOCATION:  [facility]SC                         FACILITY:  [facility]  PHYSICIAN:  Sharlet Salina T. Bradey Luzier, M.D.DATE OF BIRTH:  1932/01/26  DATE OF PROCEDURE:  06/21/2010 DATE OF DISCHARGE:  06/21/2010                              OPERATIVE REPORT   PREOPERATIVE DIAGNOSIS:  Abnormal mammogram/radial scar right breast.  POSTOPERATIVE DIAGNOSIS:  Abnormal mammogram/radial scar right breast.  SURGICAL PROCEDURE:  Needle-localized right breast lumpectomy.  SURGEON:  Lorne Skeens. Latron Ribas, MD  ANESTHESIA:  Laryngeal mask general.  BRIEF HISTORY:  Erika [name] is a 77 year old female who on recent screening mammogram was found to have a small cluster of microcalcifications in the central right breast.  Large core needle biopsy was performed which has revealed radial scar.  With this constellation of findings, we have discussed options of observation versus excision and elected to proceed with excision to obtain a pathologic exam of the entire area.  The nature of the procedure, its indications, risks of bleeding, infection, anesthetic complications were discussed and understood.  He is now brought to the operating room for this procedure.  DESCRIPTION OF OPERATION:  Following needle localization, the patient was brought to operating room and placed in supine position on the operating table and laryngeal mask general anesthesia was induced.  She received preoperative IV antibiotics.  PAS were placed.  The breast was widely and sterilely prepped and draped.  The correct patient and procedure were verified.  A curvilinear incision was made near the wire insertion site.  Dissection carried down through the subcu into the breast tissue.  The wire was brought into the incision.  I then excised a cylinder of tissue around the shaft and tip of the wire.  There was no abnormalities in the surrounding breast  tissue.  I could feel a small firm area consistent with the biopsy in the specimen.  Specimen mammogram was obtained which showed the wire and the clip within the specimen although it was somewhat close to one edge.  However, due to the likely benign nature of the lesion, I elected not to take further breast tissue as it was contained within the specimen.  This was sent for permanent pathology.  The wound was irrigated and infiltrated with plain Marcaine.  Hemostasis obtained with cautery.  The breast tissue capsule reapproximated with interrupted Vicryl and the skin with subcuticular Monocryl and Dermabond.  Sponges and instrument counts were correct.  The patient was taken to recovery in good condition.     Lorne Skeens. Chloe Baig, M.D.     Tory Emerald  D:  06/21/2010  T:  06/22/2010  Job:  [id number]  Electronically Signed by Glenna Fellows M.D. on 07/10/2010 09:41:51 AM

## 2010-07-14 ENCOUNTER — Encounter (INDEPENDENT_AMBULATORY_CARE_PROVIDER_SITE_OTHER): Payer: Self-pay | Admitting: General Surgery

## 2010-07-14 ENCOUNTER — Ambulatory Visit (INDEPENDENT_AMBULATORY_CARE_PROVIDER_SITE_OTHER): Payer: Medicare Other | Admitting: General Surgery

## 2010-07-14 DIAGNOSIS — Z9889 Other specified postprocedural states: Secondary | ICD-10-CM

## 2010-07-14 NOTE — Progress Notes (Signed)
This patient returns approximately 3 weeks following excisional breast biopsy for a sclerosing lesion. She reports no problems from her surgery. She had only mild discomfort. On examination her incision in the right breast is nicely healed without complication. We reviewed her pathology which revealed adenosis and benign fibrocystic changes with no residual sclerosing lesions seen. The wire and clip were within the specimen so I feel the area has been adequately excised.  The patient will continue routine screening and return here as needed.soreness for a couple of days.

## 2010-07-14 NOTE — Patient Instructions (Signed)
Continue routine mammograms and a self exam.  Return as needed for any problems.

## 2011-02-02 ENCOUNTER — Other Ambulatory Visit: Payer: Self-pay | Admitting: Cardiology

## 2011-03-19 ENCOUNTER — Other Ambulatory Visit: Payer: Self-pay | Admitting: Family Medicine

## 2011-03-19 DIAGNOSIS — Z1231 Encounter for screening mammogram for malignant neoplasm of breast: Secondary | ICD-10-CM

## 2011-04-11 ENCOUNTER — Other Ambulatory Visit: Payer: Self-pay | Admitting: Cardiology

## 2011-04-13 ENCOUNTER — Emergency Department (INDEPENDENT_AMBULATORY_CARE_PROVIDER_SITE_OTHER)
Admission: EM | Admit: 2011-04-13 | Discharge: 2011-04-13 | Disposition: A | Discharge status: Home Health | Payer: Medicare Other | Source: Home / Self Care | Attending: Emergency Medicine | Admitting: Emergency Medicine

## 2011-04-13 ENCOUNTER — Encounter (HOSPITAL_COMMUNITY): Payer: Self-pay

## 2011-04-13 DIAGNOSIS — R05 Cough: Secondary | ICD-10-CM

## 2011-04-13 MED ORDER — PREDNISONE 20 MG PO TABS: 40.0000 mg | ORAL_TABLET | Freq: Every day | ORAL | Status: DC

## 2011-04-13 MED ORDER — PREDNISONE 20 MG PO TABS: 40.0000 mg | ORAL_TABLET | Freq: Every day | ORAL | Status: AC

## 2011-04-13 NOTE — Discharge Instructions (Signed)
  As discussed today your current symptoms and exam were not consistent with an active along a respiratory infection as a lung exam was normal you continue to have no fevers. Followup with your primary care doctor early next week if your symptoms persist. Have recommended you take this medicine for the next 5 days to help you with your cough. If any changes such as shortness of breath, should go to the emergency department for further evaluation.   Cough, Adult  A cough is a reflex. It helps you clear your throat and airways. A cough can help heal your body. A cough can last 2 or 3 weeks (acute) or may last more than 8 weeks (chronic). Some common causes of a cough can include an infection, allergy, or a cold. HOME CARE  Only take medicine as told by your doctor.   If given, take your medicines (antibiotics) as told. Finish them even if you start to feel better.   Use a cold steam vaporizer or humidier in your home. This can help loosen thick spit (secretions).   Sleep so you are almost sitting up (semi-upright). Use pillows to do this. This helps reduce coughing.   Rest as needed.   Stop smoking if you smoke.  GET HELP RIGHT AWAY IF:  You have yellowish-white fluid (pus) in your thick spit.   Your cough gets worse.   Your medicine does not reduce coughing, and you are losing sleep.   You cough up blood.   You have trouble breathing.   Your pain gets worse and medicine does not help.   You have a fever.  MAKE SURE YOU:   Understand these instructions.   Will watch your condition.   Will get help right away if you are not doing well or get worse.  Document Released: 09/07/2010 Document Revised: 12/14/2010 Document Reviewed: 09/07/2010 [facility] Patient Information 2012 [place], [place].

## 2011-04-13 NOTE — ED Notes (Signed)
Pt c/o diarrhea, congestion and non productive cough for past month.  Pt states she has just felt bad for the past month.  Denies fever, N/V.

## 2011-04-13 NOTE — ED Provider Notes (Signed)
History     CSN: [id number]  Arrival date & time 04/13/11  4098   First MD Initiated Contact with Patient 04/13/11 0900      Chief Complaint  Patient presents with  . Cough  . Diarrhea    (Consider location/radiation/quality/duration/timing/severity/associated sxs/prior treatment) HPI Comments: Patient presents urgent care today with ongoing symptoms for about a month. She describes this dry cough has not improved despite cough syrup that was prescribed by her primary care Dr. Patient denies any current upper congestion, denies any fevers for course of the month, denies any shortness of breath or breathing difficulties during the course of March. Does describe that he feels somewhat fatigue and with poor appetite and the cough seems to have persisted. Patient also went into describing sequence of events that included an antibiotic cycle and some diarrheas that she experienced that have been resolv the past 5 days.  Patient denies any current shortness of breath, fevers, chest pains, dizziness. She seemed to be in need to resolve her cough.  Patient is a 78 y.o. female presenting with cough and diarrhea. The history is provided by the patient.  Cough The current episode started more than 1 week ago. The problem occurs every few hours. The problem has not changed since onset.The cough is non-productive. There has been no fever. Pertinent negatives include no chest pain, no chills, no sweats, no weight loss, no ear pain, no headaches, no rhinorrhea, no sore throat, no shortness of breath and no wheezing. The treatment provided no relief. Her past medical history does not include COPD.  Diarrhea The primary symptoms include diarrhea. Primary symptoms do not include fever, weight loss, fatigue, dysuria or arthralgias.  The illness does not include chills.    Past Medical History  Diagnosis Date  . Hyperthyroidism   . Cardiac arrhythmia   . GERD (gastroesophageal reflux disease)   .  Hypertension   . Seasonal allergies     Past Surgical History  Procedure Date  . Breast surgery     right  . Thyroidectomy 1961    80% removed    Family History  Problem Relation Age of Onset  . Heart disease Mother   . Heart disease Father     History  Substance Use Topics  . Smoking status: Never Smoker   . Smokeless tobacco: Not on file  . Alcohol Use: No    OB History    Grav Para Term Preterm Abortions TAB SAB Ect Mult Living                  Review of Systems  Constitutional: Positive for appetite change. Negative for fever, chills, weight loss, fatigue and unexpected weight change.  HENT: Negative for ear pain, sore throat, facial swelling, rhinorrhea and neck pain.   Eyes: Negative for pain and discharge.  Respiratory: Positive for cough. Negative for apnea, chest tightness, shortness of breath, wheezing and stridor.   Cardiovascular: Negative for chest pain.  Gastrointestinal: Positive for diarrhea.  Genitourinary: Negative for dysuria.  Musculoskeletal: Positive for joint swelling. Negative for arthralgias.  Neurological: Negative for dizziness and headaches.    Allergies  Aspirin; Celecoxib; and Sulfonamide derivatives  Home Medications   Current Outpatient Rx  Name Route Sig Dispense Refill  . DESLORATADINE 5 MG PO TABS Oral Take 5 mg by mouth daily.      . ERGOCALCIFEROL 50000 UNITS PO CAPS Oral Take 50,000 Units by mouth once a week.      Marland Kitchen LEVOTHYROXINE SODIUM  175 MCG PO TABS Oral Take 175 mcg by mouth daily.      Marland Kitchen LISINOPRIL 10 MG PO TABS Oral Take 10 mg by mouth 3 (three) times daily.      . OMEGA-3-ACID ETHYL ESTERS 1 G PO CAPS Oral Take 2 g by mouth 2 (two) times daily.      Marland Kitchen OMEPRAZOLE 20 MG PO CPDR Oral Take 20 mg by mouth daily.      Marland Kitchen POTASSIUM CHLORIDE ER 10 MEQ PO TBCR Oral Take 10 mEq by mouth 2 (two) times daily.      Marland Kitchen PREDNISONE 20 MG PO TABS Oral Take 2 tablets (40 mg total) by mouth daily. 2 tablets daily for 5 days 10 tablet 0   . WARFARIN SODIUM 5 MG PO TABS Oral Take 5 mg by mouth daily. 5 mg five days 7.5 mg tab two days       BP 163/77  Pulse 72  Temp(Src) 97.5 F (36.4 C) (Oral)  Resp 14  SpO2 98%  Physical Exam  Constitutional: She appears well-developed and well-nourished. No distress.  HENT:  Head: Normocephalic.  Mouth/Throat: No oropharyngeal exudate.  Eyes: Conjunctivae are normal.  Neck: Neck supple. No JVD present.  Cardiovascular: Normal rate, normal heart sounds and normal pulses.  Exam reveals no gallop and no friction rub.   Pulmonary/Chest: Effort normal and breath sounds normal. She has no decreased breath sounds.  Lymphadenopathy:    She has no cervical adenopathy.    ED Course  Procedures (including critical care time)  Labs Reviewed - No data to display No results found.   1. Cough       MDM  Patient presents urgent care after having had 2 interactions were primary care Dr. office visit in some telephone encounters treating and a dressing cough and diarrhea. Patient completed and antibiotics cycle of, Augmentin and had some diarrheas afterwards, patient was instructed to finish course and take an antidiarrheal medicine which she did during our encounter she expressing her diarrheas have improved and has had none within the last 5 years (no abdominal pains, no fevers). Her office visit today is related to this ongoing nonproductive cough. Her pulse oxygenation, and respiratory exam were unremarkable patient is comfortably sitting well-dressed amenable and contributing to her exam, afebrile. Patient was prescribed a cough syrup she can't recall. Patient denies any chest pains dizziness vertigo. Does describe that she has not had good appetite for about a month. Patient looks comfortable and agreed to try a short course of low dose of prednisone to see if it helps with her cough and she will followup with her primary care doctor early this week if symptoms were to  persist.       Jimmie Molly, MD 04/13/11 [phone]

## 2011-04-25 ENCOUNTER — Ambulatory Visit
Admission: RE | Admit: 2011-04-25 | Discharge: 2011-04-25 | Disposition: A | Discharge status: Home / Self Care | Payer: Medicare Other | Source: Ambulatory Visit | Attending: Family Medicine | Admitting: Family Medicine

## 2011-04-25 DIAGNOSIS — Z1231 Encounter for screening mammogram for malignant neoplasm of breast: Secondary | ICD-10-CM

## 2011-05-06 ENCOUNTER — Emergency Department (HOSPITAL_COMMUNITY): Payer: Medicare Other

## 2011-05-06 ENCOUNTER — Emergency Department (HOSPITAL_COMMUNITY)
Admission: EM | Admit: 2011-05-06 | Discharge: 2011-05-06 | Disposition: A | Discharge status: Home / Self Care | Payer: Medicare Other | Attending: Emergency Medicine | Admitting: Emergency Medicine

## 2011-05-06 ENCOUNTER — Encounter (HOSPITAL_COMMUNITY): Payer: Self-pay

## 2011-05-06 DIAGNOSIS — Z79899 Other long term (current) drug therapy: Secondary | ICD-10-CM | POA: Insufficient documentation

## 2011-05-06 DIAGNOSIS — R079 Chest pain, unspecified: Secondary | ICD-10-CM | POA: Insufficient documentation

## 2011-05-06 DIAGNOSIS — R110 Nausea: Secondary | ICD-10-CM | POA: Insufficient documentation

## 2011-05-06 DIAGNOSIS — E0590 Thyrotoxicosis, unspecified without thyrotoxic crisis or storm: Secondary | ICD-10-CM | POA: Insufficient documentation

## 2011-05-06 DIAGNOSIS — R61 Generalized hyperhidrosis: Secondary | ICD-10-CM | POA: Insufficient documentation

## 2011-05-06 DIAGNOSIS — K8050 Calculus of bile duct without cholangitis or cholecystitis without obstruction: Secondary | ICD-10-CM

## 2011-05-06 DIAGNOSIS — K219 Gastro-esophageal reflux disease without esophagitis: Secondary | ICD-10-CM | POA: Insufficient documentation

## 2011-05-06 DIAGNOSIS — R109 Unspecified abdominal pain: Secondary | ICD-10-CM | POA: Insufficient documentation

## 2011-05-06 DIAGNOSIS — K8020 Calculus of gallbladder without cholecystitis without obstruction: Secondary | ICD-10-CM | POA: Insufficient documentation

## 2011-05-06 DIAGNOSIS — Z7901 Long term (current) use of anticoagulants: Secondary | ICD-10-CM | POA: Insufficient documentation

## 2011-05-06 DIAGNOSIS — I10 Essential (primary) hypertension: Secondary | ICD-10-CM | POA: Insufficient documentation

## 2011-05-06 DIAGNOSIS — E785 Hyperlipidemia, unspecified: Secondary | ICD-10-CM | POA: Insufficient documentation

## 2011-05-06 DIAGNOSIS — I498 Other specified cardiac arrhythmias: Secondary | ICD-10-CM | POA: Insufficient documentation

## 2011-05-06 HISTORY — DX: Anemia, unspecified: D64.9

## 2011-05-06 HISTORY — DX: Obesity, unspecified: E66.9

## 2011-05-06 HISTORY — DX: Hyperlipidemia, unspecified: E78.5

## 2011-05-06 LAB — CBC
Hemoglobin: 11.2 g/dL — ABNORMAL LOW (ref 12.0–15.0)
Platelets: 255 10*3/uL (ref 150–400)
RBC: 3.88 MIL/uL (ref 3.87–5.11)
WBC: 10.9 10*3/uL — ABNORMAL HIGH (ref 4.0–10.5)

## 2011-05-06 LAB — POCT I-STAT TROPONIN I: Troponin i, poc: 0 ng/mL (ref 0.00–0.08)

## 2011-05-06 LAB — COMPREHENSIVE METABOLIC PANEL
ALT: 40 U/L — ABNORMAL HIGH (ref 0–35)
AST: 83 U/L — ABNORMAL HIGH (ref 0–37)
Alkaline Phosphatase: 96 U/L (ref 39–117)
CO2: 26 mEq/L (ref 19–32)
Chloride: 90 mEq/L — ABNORMAL LOW (ref 96–112)
GFR calc non Af Amer: 53 mL/min — ABNORMAL LOW (ref >90)
Sodium: 126 mEq/L — ABNORMAL LOW (ref 135–145)
Total Bilirubin: 0.4 mg/dL (ref 0.3–1.2)

## 2011-05-06 MED ORDER — ONDANSETRON HCL 4 MG/2ML IJ SOLN
INTRAMUSCULAR | Status: AC
  Administered 2011-05-06: 4 mg
  Filled 2011-05-06: qty 2

## 2011-05-06 MED ORDER — ONDANSETRON 8 MG PO TBDP: 8.0000 mg | ORAL_TABLET | Freq: Three times a day (TID) | ORAL | Status: AC | PRN

## 2011-05-06 MED ORDER — SODIUM CHLORIDE 0.9 % IV SOLN
1000.0000 mL | INTRAVENOUS | Status: DC
  Administered 2011-05-06: 1000 mL via INTRAVENOUS

## 2011-05-06 MED ORDER — HYDROCODONE-ACETAMINOPHEN 5-325 MG PO TABS: 1.0000 | ORAL_TABLET | Freq: Four times a day (QID) | ORAL | Status: AC | PRN

## 2011-05-06 MED ORDER — NITROGLYCERIN 2 % TD OINT
1.0000 [in_us] | TOPICAL_OINTMENT | Freq: Four times a day (QID) | TRANSDERMAL | Status: DC
  Administered 2011-05-06: 1 [in_us] via TOPICAL
  Filled 2011-05-06: qty 1

## 2011-05-06 NOTE — Discharge Instructions (Signed)
Biliary Colic  Biliary colic is a steady or irregular pain in the upper abdomen. It is usually under the right side of the rib cage. It happens when gallstones interfere with the normal flow of bile from the gallbladder. Bile is a liquid that helps to digest fats. Bile is made in the liver and stored in the gallbladder. When you eat a meal, bile passes from the gallbladder through the cystic duct and the common bile duct into the small intestine. There, it mixes with partially digested food. If a gallstone blocks either of these ducts, the normal flow of bile is blocked. The muscle cells in the bile duct contract forcefully to try to move the stone. This causes the pain of biliary colic.  SYMPTOMS   A person with biliary colic usually complains of pain in the upper abdomen. This pain can be:   In the center of the upper abdomen just below the breastbone.   In the upper-right part of the abdomen, near the gallbladder and liver.   Spread back toward the right shoulder blade.   Nausea and vomiting.   The pain usually occurs after eating.   Biliary colic is usually triggered by the digestive system's demand for bile. The demand for bile is high after fatty meals. Symptoms can also occur when a person who has been fasting suddenly eats a very large meal. Most episodes of biliary colic pass after 1 to 5 hours. After the most intense pain passes, your abdomen may continue to ache mildly for about 24 hours.  DIAGNOSIS  After you describe your symptoms, your caregiver will perform a physical exam. He or she will pay attention to the upper right portion of your belly (abdomen). This is the area of your liver and gallbladder. An ultrasound will help your caregiver look for gallstones. Specialized scans of the gallbladder may also be done. Blood tests may be done, especially if you have fever or if your pain persists. PREVENTION  Biliary colic can be prevented by controlling the risk factors for gallstones.  Some of these risk factors, such as heredity, increasing age, and pregnancy are a normal part of life. Obesity and a high-fat diet are risk factors you can change through a healthy lifestyle. Women going through menopause who take hormone replacement therapy (estrogen) are also more likely to develop biliary colic. TREATMENT   Pain medication may be prescribed.   You may be encouraged to eat a fat-free diet.   If the first episode of biliary colic is severe, or episodes of colic keep retuning, surgery to remove the gallbladder (cholecystectomy) is usually recommended. This procedure can be done through small incisions using an instrument called a laparoscope. The procedure often requires a brief stay in the hospital. Some people can leave the hospital the same day. It is the most widely used treatment in people troubled by painful gallstones. It is effective and safe, with no complications in more than 90% of cases.   If surgery cannot be done, medication that dissolves gallstones may be used. This medication is expensive and can take months or years to work. Only small stones will dissolve.   Rarely, medication to dissolve gallstones is combined with a procedure called shock-wave lithotripsy. This procedure uses carefully aimed shock waves to break up gallstones. In many people treated with this procedure, gallstones form again within a few years.  PROGNOSIS  If gallstones block your cystic duct or common bile duct, you are at risk for repeated episodes of   duct, you are at risk for repeated episodes of biliary colic. There is also a 25% chance that you will develop a gallbladder infection(acute cholecystitis), or some other complication of gallstones within 10 to 20 years. If you have surgery, schedule it at a time that is convenient for you and at a time when you are not sick.  HOME CARE INSTRUCTIONS    Drink plenty of clear fluids.   Avoid fatty, greasy or fried foods, or any foods that make your pain worse.   Take medications as directed.  SEEK MEDICAL  CARE IF:    You develop a fever over 100.5 F (38.1 C).   Your pain gets worse over time.   You develop nausea that prevents you from eating and drinking.   You develop vomiting.  SEEK IMMEDIATE MEDICAL CARE IF:    You have continuous or severe belly (abdominal) pain which is not relieved with medications.   You develop nausea and vomiting which is not relieved with medications.   You have symptoms of biliary colic and you suddenly develop a fever and shaking chills. This may signal cholecystitis. Call your caregiver immediately.   You develop a yellow color to your skin or the white part of your eyes (jaundice).  Document Released: 05/28/2005 Document Revised: 12/14/2010 Document Reviewed: 08/07/2007  [employer] Patient Information 2012 [employer]

## 2011-05-06 NOTE — ED Notes (Signed)
Pt states she had sudden onset of substernal CP and abd pain. Pt states "i just broke out in a cool sweat and it just hurt so bad." Pt associated pain with nausea as well. Currently pt has no complaints. Pt denies any pain or nausea.

## 2011-05-06 NOTE — ED Notes (Signed)
Patient transported to Ultrasound 

## 2011-05-06 NOTE — ED Notes (Signed)
Pt d/c home in NAD. Pt voiced understanding of d/c instructions and follow up care. PT denies any pain at this time.

## 2011-05-06 NOTE — ED Provider Notes (Signed)
History     CSN: [id number]  Arrival date & time 05/06/11  1843   First MD Initiated Contact with Patient 05/06/11 1901      Chief Complaint  Patient presents with  . Chest Pain    (Consider location/radiation/quality/duration/timing/severity/associated sxs/prior treatment) Patient is a 78 y.o. female presenting with chest pain. The history is provided by the patient.  Chest Pain The chest pain began 1 - 2 hours ago (Pt had been eating a few bites of fruit cake). Duration of episode(s) is 1 hour. Chest pain occurs constantly. The chest pain is resolved. At its most intense, the pain is at 10/10. The pain is currently at 0/10. Quality: it just hurt real bad. The pain does not radiate. Primary symptoms include nausea. Primary symptoms comment: abdominal pain  Associated symptoms include diaphoresis. Associated symptoms comments: Abdominal pain. She tried aspirin and nitroglycerin for the symptoms.  Pertinent negatives for past medical history include no CAD, no MI and no PE. Procedure history comments: priro stress test 3-4 years ago.     Past Medical History  Diagnosis Date  . Hyperthyroidism   . Cardiac arrhythmia   . GERD (gastroesophageal reflux disease)   . Hypertension   . Seasonal allergies   . Anemia   . Hyperlipidemia     Past Surgical History  Procedure Date  . Breast surgery     right  . Thyroidectomy 1961    80% removed    Family History  Problem Relation Age of Onset  . Heart disease Mother   . Heart disease Father     History  Substance Use Topics  . Smoking status: Never Smoker   . Smokeless tobacco: Not on file  . Alcohol Use: No    OB History    Grav Para Term Preterm Abortions TAB SAB Ect Mult Living                  Review of Systems  Constitutional: Positive for diaphoresis.  Cardiovascular: Positive for chest pain.  Gastrointestinal: Positive for nausea.  All other systems reviewed and are negative.    Allergies  Aspirin;  Celecoxib; and Sulfonamide derivatives  Home Medications   Current Outpatient Rx  Name Route Sig Dispense Refill  . DESLORATADINE 5 MG PO TABS Oral Take 5 mg by mouth daily.      . ERGOCALCIFEROL 50000 UNITS PO CAPS Oral Take 50,000 Units by mouth once a week.      Marland Kitchen LEVOTHYROXINE SODIUM 175 MCG PO TABS Oral Take 175 mcg by mouth daily.      Marland Kitchen LISINOPRIL 10 MG PO TABS Oral Take 10 mg by mouth 3 (three) times daily.      . OMEGA-3-ACID ETHYL ESTERS 1 G PO CAPS Oral Take 2 g by mouth 2 (two) times daily.      Marland Kitchen OMEPRAZOLE 20 MG PO CPDR Oral Take 20 mg by mouth daily.      Marland Kitchen POTASSIUM CHLORIDE ER 10 MEQ PO TBCR Oral Take 10 mEq by mouth 2 (two) times daily.      . WARFARIN SODIUM 5 MG PO TABS Oral Take 5 mg by mouth daily. 5 mg five days 7.5 mg tab two days       BP 159/39  Pulse 64  Temp(Src) 97.6 F (36.4 C) (Oral)  Resp 16  SpO2 99%  Physical Exam  Nursing note and vitals reviewed. Constitutional: She appears well-developed and well-nourished. No distress.  HENT:  Head: Normocephalic and atraumatic.  Right Ear: External ear normal.  Left Ear: External ear normal.  Eyes: Conjunctivae are normal. Right eye exhibits no discharge. Left eye exhibits no discharge. No scleral icterus.  Neck: Neck supple. No tracheal deviation present.  Cardiovascular: Normal rate and intact distal pulses.  An irregular rhythm present.  Pulmonary/Chest: Effort normal and breath sounds normal. No stridor. No respiratory distress. She has no wheezes. She has no rales.  Abdominal: Soft. Bowel sounds are normal. She exhibits no distension. There is no tenderness. There is no rebound and no guarding.  Musculoskeletal: She exhibits no edema and no tenderness.  Neurological: She is alert. She has normal strength. No sensory deficit. Cranial nerve deficit:  no gross defecits noted. She exhibits normal muscle tone. She displays no seizure activity. Coordination normal.  Skin: Skin is warm and dry. No rash noted.    Psychiatric: She has a normal mood and affect.    ED Course  Procedures (including critical care time)  Rate: 86  Rhythm: atrial fibrillation  QRS Axis: normal  Intervals: normal  ST/T Wave abnormalities: normal  Conduction Disutrbances:none  Narrative Interpretation: Oral in R-wave progression  Old EKG Reviewed: no sig changes  Labs Reviewed  CBC - Abnormal; Notable for the following:    WBC 10.9 (*)    Hemoglobin 11.2 (*)    HCT 32.6 (*)    All other components within normal limits  COMPREHENSIVE METABOLIC PANEL - Abnormal; Notable for the following:    Sodium 126 (*)    Chloride 90 (*)    Glucose, Bld 132 (*)    AST 83 (*)    ALT 40 (*)    GFR calc non Af Amer 53 (*)    GFR calc Af Amer 61 (*)    All other components within normal limits  PROTIME-INR - Abnormal; Notable for the following:    Prothrombin Time 29.8 (*)    INR 2.78 (*)    All other components within normal limits  APTT - Abnormal; Notable for the following:    aPTT 51 (*)    All other components within normal limits  LIPASE, BLOOD  POCT I-STAT TROPONIN I   US Abdomen Complete  05/06/2011  *RADIOLOGY REPORT*  Clinical Data:  Abdominal pain  COMPLETE ABDOMINAL ULTRASOUND  Comparison:  None.  Findings: Technically challenging examination secondary patient body habitus and bowel gas artifact.  Gallbladder:  Sludge filled gallbladder.  7 mm echogenic nonshadowing stone.  Wall is mildly thickened at 4 mm.  Negative sonographic Murphy's sign.  Common bile duct:  Measures 6 mm, within normal limits.  Liver:  Heterogeneous echogenicity nonspecific, can be seen with fatty infiltration.  IVC:  Appears normal.  Pancreas:  Poorly visualized secondary to overlying bowel gas artifact.  Spleen:  Normal sonographic appearance measuring 11 cm.  Right Kidney:  Measures 12 cm.  Hydronephrosis.  Left Kidney:  Measures 12 cm.  No hydronephrosis.  Abdominal aorta:  Atherosclerotic disease.  No aneurysm identified. Measures up to  2.6 cm.  IMPRESSION: Cholelithiasis and mild gallbladder wall thickening.  No pericholecystic fluid or sonographic Murphy's sign.  If clinical concern for acute cholecystitis persists, consider HIDA scan.  Heterogeneous liver echogenicity suggests fatty infiltration.  Original Report Authenticated By: Waneta Martins, M.D.   Dg Chest Portable 1 View  05/06/2011  *RADIOLOGY REPORT*  Clinical Data: Chest pain, epigastric pain, abdominal pain  PORTABLE CHEST - 1 VIEW  Comparison: 06/16/2010; 07/24/2009; 07/21/2009  Findings:  Grossly unchanged enlarged cardiac silhouette and mediastinal  contour.  The lungs remain hyperinflated with flattening of bilateral hemidiaphragms.  Grossly unchanged left basilar heterogeneous opacities and chronic blunting of the left costophrenic angle.  No definite pleural effusion or pneumothorax. Grossly unchanged bones.  IMPRESSION: 1.  Cardiomegaly and hyperinflated lungs without definite acute cardiopulmonary disease. 2.  Chronic left basilar opacity and blunting left costophrenic angle, possibly atelectasis or scar.  Original Report Authenticated By: Waynard Reeds, M.D.      MDM  9:53 PM repeat exam patient continues to have no abdominal tenderness to palpation. She remains pain free without any discomfort. I doubt that she has cholecystitis.  I  believe she had an episode of symptomatic biliary colic. I discussed the findings with the patient and the family. We'll consult Gen. surgery to assist in arranging close outpatient followup. I discussed warning signs and precautions with the patient as well as a low-fat diet.  10:10 PM I discussed the case with Dr. Magnus Ivan who agrees with close followup in the surgery office. I will provide the patient the number for the clinic.       Celene Kras, MD 05/06/11 [phone]

## 2011-05-06 NOTE — ED Notes (Signed)
Per ems- pt c/o substernal chest pain and abd pain for 30-40 min rating 10/10. Pain was associated with diaphoresis and nausea. Pt received 324 asa, 1 nitro, and 4mg  zofran. Pt currently only c/o nausea.

## 2011-05-07 LAB — POCT I-STAT TROPONIN I: Troponin i, poc: 0.01 ng/mL (ref 0.00–0.08)

## 2011-05-09 ENCOUNTER — Encounter (INDEPENDENT_AMBULATORY_CARE_PROVIDER_SITE_OTHER): Payer: Self-pay | Admitting: Surgery

## 2011-05-09 ENCOUNTER — Ambulatory Visit (INDEPENDENT_AMBULATORY_CARE_PROVIDER_SITE_OTHER): Payer: Medicare Other | Admitting: Surgery

## 2011-05-09 VITALS — BP 168/92 | HR 70 | Temp 97.4°F | Resp 16 | Ht 67.0 in | Wt 215.4 lb

## 2011-05-09 DIAGNOSIS — Z7901 Long term (current) use of anticoagulants: Secondary | ICD-10-CM | POA: Insufficient documentation

## 2011-05-09 DIAGNOSIS — K8020 Calculus of gallbladder without cholecystitis without obstruction: Secondary | ICD-10-CM

## 2011-05-09 DIAGNOSIS — K219 Gastro-esophageal reflux disease without esophagitis: Secondary | ICD-10-CM | POA: Insufficient documentation

## 2011-05-09 HISTORY — DX: Calculus of gallbladder without cholecystitis without obstruction: K80.20

## 2011-05-09 NOTE — Progress Notes (Signed)
Subjective:     Patient ID: Erika [name], female   DOB: 05-06-1932, 78 y.o.   MRN: [id number]  HPI  Erika [name]  Jul 03, 1932 [id number]  Patient Care Team: [name], MD as PCP - General (Family Medicine) [name], MD as Consulting Physician (Cardiology) [name], MD as Consulting Physician (Gastroenterology)  This patient is a 78 y.o.female who presents today for surgical evaluation at the request of [name] with ED.   Reason for visit abdominal pain with gallstones. Question of biliary colic  Patient is an obese female with numerous health issues. She has a history of reflux with spicy foods. Occasionally causes indigestion and sometimes urgency of cc. She noted episodes of your gastric epigastric pain after for bifurcate. He was diffuse. It seemed to be in the upper abdomen. She felt nauseated and sweaty. She thought she was having a heart attack. She went to emergency room. Her pain resolved after an hour so. Cardiac workup was negative. Workup revealed gallstones. For suspected biliary etiology. She was sent for followup with [name].  Patient has chronic low mid back pain. Has been on for months. She's recovered over a cold last month. He no sick contacts or travel history. She tends to have a bowel movement every other day. History of colon polyps last colonoscopy 5 years ago. She can only walk about 10 minutes before her hammertoes are bothering her.  She had an episode of pharyngeal perforation after an attempted TEE that required the drainage of a pharyngeal abscess by Dr. [name]. Never had upper endoscopy for fear of perforating what was presumed Zenker's diverticulum.  Patient Active Problem List  Diagnoses  . UNSPECIFIED HYPOTHYROIDISM  . HYPERLIPIDEMIA  . OBESITY  . ANEMIA  . HYPERTENSION  . ATRIAL FIBRILLATION  . SLEEP APNEA  . Anticoagulated on warfarin  . Gallstones, probable biliary colic  . GERD (gastroesophageal reflux disease)    Past  Medical History  Diagnosis Date  . Hyperthyroidism   . Cardiac arrhythmia   . GERD (gastroesophageal reflux disease)   . Hypertension   . Seasonal allergies   . Anemia   . Hyperlipidemia   . Obesity   . Arthritis   . Abdominal pain     Past Surgical History  Procedure Date  . Thyroidectomy 1961    80% removed  . Breast surgery 2012 (June or July)    right  . Pharyngeal repair [id number]    Dr [name] for [name]-TEE pharygeal tear w neck abscess I&D    History   Social History  . Marital Status: Married    Spouse Name: N/A    Number of Children: N/A  . Years of Education: N/A   Occupational History  . Not on file.   Social History Main Topics  . Smoking status: Never Smoker   . Smokeless tobacco: Never Used  . Alcohol Use: No  . Drug Use: No  . Sexually Active: Not on file   Other Topics Concern  . Not on file   Social History Narrative  . No narrative on file    Family History  Problem Relation Age of Onset  . Heart disease Mother   . Heart disease Father   . Cancer Maternal Aunt     breast    Current Outpatient Prescriptions  Medication Sig Dispense Refill  . KLOR-CON M10 10 MEQ tablet Daily.      Marland Kitchen zolpidem (AMBIEN) 10 MG tablet Daily.      Marland Kitchen  desloratadine (CLARINEX) 5 MG tablet Take 5 mg by mouth daily.        . ergocalciferol (VITAMIN D2) 50000 UNITS capsule Take 50,000 Units by mouth once a week.       . hydrochlorothiazide (HYDRODIURIL) 25 MG tablet Take 25 mg by mouth daily.      Marland Kitchen HYDROcodone-acetaminophen (NORCO) 5-325 MG per tablet Take 1-2 tablets by mouth every 6 (six) hours as needed for pain.  16 tablet  0  . levothyroxine (SYNTHROID, LEVOTHROID) 175 MCG tablet Take 175 mcg by mouth daily.        Marland Kitchen lisinopril (PRINIVIL,ZESTRIL) 10 MG tablet Take 10 mg by mouth 3 (three) times daily.        Marland Kitchen omega-3 acid ethyl esters (LOVAZA) 1 G capsule Take 2 g by mouth 2 (two) times daily.        Marland Kitchen omeprazole (PRILOSEC) 20 MG capsule Take 20 mg by mouth  daily.        . ondansetron (ZOFRAN ODT) 8 MG disintegrating tablet Take 1 tablet (8 mg total) by mouth every 8 (eight) hours as needed for nausea.  20 tablet  0  . potassium chloride (K-DUR) 10 MEQ tablet Take 10 mEq by mouth 2 (two) times daily.        Marland Kitchen warfarin (COUMADIN) 5 MG tablet Take 5 mg by mouth daily. 5 mg five days. Wednesdays she takes one in a half         Allergies  Allergen Reactions  . Celecoxib Hives and Itching    Face only  . Amiodarone     Ineffective   . Aspirin   . Dronedarone Hydrochloride     Edema   . Ezetimibe     Fatigue   . Flecainide Acetate     fatigue  . Sulfonamide Derivatives     BP 168/92  Pulse 70  Temp(Src) 97.4 F (36.3 C) (Temporal)  Resp 16  Ht 5\' 7"  (1.702 m)  Wt 215 lb 6 oz (97.693 kg)  BMI 33.73 kg/m2     Review of Systems  Constitutional: Negative for fever, chills, diaphoresis, appetite change and fatigue.  HENT: Positive for postnasal drip. Negative for ear pain, sore throat, trouble swallowing, neck pain and ear discharge.        Got over a cold/cough in March 2013  Eyes: Negative for photophobia, discharge and visual disturbance.  Respiratory: Negative for cough, choking, chest tightness and shortness of breath.   Cardiovascular: Positive for chest pain. Negative for palpitations and leg swelling.       Patient walks 15 minutes for about 1/4 miles without difficulty.  Stops due to hammer toe pain. No exertional chest/neck/shoulder/arm pain.   Gastrointestinal: Positive for nausea and abdominal pain. Negative for vomiting, diarrhea, constipation, blood in stool, abdominal distention, anal bleeding and rectal pain.       BM Q2D  No personal nor family history of GI/colon cancer, inflammatory bowel disease, irritable bowel syndrome, allergy such as Celiac Sprue, dietary/dairy problems, colitis, ulcers nor gastritis.    No recent gastroenteritis.  No travel outside the country.  No changes in diet.      Genitourinary:  Negative for dysuria, frequency and difficulty urinating.  Musculoskeletal: Negative for myalgias and gait problem.  Skin: Negative for color change, pallor and rash.  Neurological: Negative for dizziness, speech difficulty, weakness and numbness.  Hematological: Negative for adenopathy.  Psychiatric/Behavioral: Negative for confusion and agitation. The patient is not nervous/anxious.  Objective:   Physical Exam  Constitutional: She is oriented to person, place, and time. She appears well-developed and well-nourished. No distress.  HENT:  Head: Normocephalic.  Mouth/Throat: Oropharynx is clear and moist. No oropharyngeal exudate.  Eyes: Conjunctivae and EOM are normal. Pupils are equal, round, and reactive to light. No scleral icterus.  Neck: Normal range of motion. Neck supple. No tracheal deviation present.  Cardiovascular: Normal rate, regular rhythm and intact distal pulses.   Pulmonary/Chest: Effort normal and breath sounds normal. No respiratory distress. She exhibits no tenderness.  Abdominal: Soft. Bowel sounds are normal. She exhibits no shifting dullness, no distension and no mass. There is no tenderness. There is no rebound, no guarding, no CVA tenderness, no tenderness at McBurney's point and negative Murphy's sign. No hernia. Hernia confirmed negative in the right inguinal area and confirmed negative in the left inguinal area.       Obese  Genitourinary: No vaginal discharge found.  Musculoskeletal: Normal range of motion. She exhibits no tenderness.  Lymphadenopathy:    She has no cervical adenopathy.       Right: No inguinal adenopathy present.       Left: No inguinal adenopathy present.  Neurological: She is alert and oriented to person, place, and time. No cranial nerve deficit. She exhibits normal muscle tone. Coordination normal.  Skin: Skin is warm and dry. No rash noted. She is not diaphoretic. No erythema.  Psychiatric: She has a normal mood and affect. Her  behavior is normal. Judgment and thought content normal.   US Abdomen Complete  05/06/2011  *RADIOLOGY REPORT*  Clinical Data:  Abdominal pain  COMPLETE ABDOMINAL ULTRASOUND  Comparison:  None.  Findings: Technically challenging examination secondary patient body habitus and bowel gas artifact.  Gallbladder:  Sludge filled gallbladder.  7 mm echogenic nonshadowing stone.  Wall is mildly thickened at 4 mm.  Negative sonographic Murphy's sign.  Common bile duct:  Measures 6 mm, within normal limits.  Liver:  Heterogeneous echogenicity nonspecific, can be seen with fatty infiltration.  IVC:  Appears normal.  Pancreas:  Poorly visualized secondary to overlying bowel gas artifact.  Spleen:  Normal sonographic appearance measuring 11 cm.  Right Kidney:  Measures 12 cm.  Hydronephrosis.  Left Kidney:  Measures 12 cm.  No hydronephrosis.  Abdominal aorta:  Atherosclerotic disease.  No aneurysm identified. Measures up to 2.6 cm.  IMPRESSION: Cholelithiasis and mild gallbladder wall thickening.  No pericholecystic fluid or sonographic Murphy's sign.  If clinical concern for acute cholecystitis persists, consider HIDA scan.  Heterogeneous liver echogenicity suggests fatty infiltration.  Original Report Authenticated By: Waneta Martins, M.D.   Dg Chest Portable 1 View  05/06/2011  *RADIOLOGY REPORT*  Clinical Data: Chest pain, epigastric pain, abdominal pain  PORTABLE CHEST - 1 VIEW  Comparison: 06/16/2010; 07/24/2009; 07/21/2009  Findings:  Grossly unchanged enlarged cardiac silhouette and mediastinal contour.  The lungs remain hyperinflated with flattening of bilateral hemidiaphragms.  Grossly unchanged left basilar heterogeneous opacities and chronic blunting of the left costophrenic angle.  No definite pleural effusion or pneumothorax. Grossly unchanged bones.  IMPRESSION: 1.  Cardiomegaly and hyperinflated lungs without definite acute cardiopulmonary disease. 2.  Chronic left basilar opacity and blunting left  costophrenic angle, possibly atelectasis or scar.  Original Report Authenticated By: Waynard Reeds, M.D.   Mm Digital Screening  04/26/2011  DG SCREEN MAMMOGRAM BILATERAL Bilateral CC and MLO view(s) were taken.  DIGITAL SCREENING MAMMOGRAM WITH CAD: The breast tissue is almost entirely fatty.  No masses or malignant type calcifications are  identified.  Compared with prior studies.  Images were processed with CAD.  IMPRESSION: No specific mammographic evidence of malignancy.  Next screening mammogram is recommended in one  year.  A result letter of this screening mammogram will be mailed directly to the patient.  ASSESSMENT: Negative - BI-RADS 1  Screening mammogram in 1 year. ,     Assessment:     Morbidly obese female with gallstones and tach suspicious for biliary etiology. Poor historian makes it a challenge to tease out.    Plan:     Given the fact that the rest of her differential diagnosis is less likely and she was ruled out for heart attack, I suspect the gallbladder is etiology of her symptoms. I did offer to see gastroneurology to see if any workup or endoscopy would be of help. She was hesitant to do that. I recommend she consider trial of proton pump inhibitors omeprazole twice a day. She then remembered that she does take omeprazole once a day. If she's not had improvement in symptoms with that, then it makes foregut etiology less likely. Ultimately I think she would benefit from cholecystectomy and not press too hard for her. I did discuss the procedure with her. I think treatment will start out single site.  The anatomy & physiology of hepatobiliary & pancreatic function was discussed.  The pathophysiology of gallbladder dysfunction was discussed.  Natural history risks without surgery was discussed.   I feel the risks of no intervention will lead to serious problems that outweigh the operative risks; therefore, I recommended cholecystectomy to remove the pathology.  I explained  laparoscopic techniques with possible need for an open approach.  Probable cholangiogram to evaluate the bilary tract was explained as well.    Risks such as bleeding, infection, abscess, leak, injury to other organs, need for further treatment, heart attack, death, and other risks were discussed.  I noted a good likelihood this will help address the problem.  Possibility that this will not correct all abdominal symptoms was explained.  Goals of [name]-operative recovery were discussed as well.  We will work to minimize complications.  An educational handout further explaining the pathology and treatment options was given as well.  Questions were answered.  The patient expresses understanding & wishes to think about having surgery.  Cardiac clearance:  I am concerned about the health of the patient and the ability to tolerate the operation.  Therefore, we will request clearance by cardiology to better assess operative risk & see if a reevaluation, further workup, adjustment to medications, etc is needed.

## 2011-05-09 NOTE — Patient Instructions (Signed)
See the Handout(s) we gave you.  Increase your antacid medicine to 2x/day for 3 weeks to see if it controls your symptoms.  Consider Gastroenterology consult   Consider surgery.  Please call our office at [phone] if you wish to schedule surgery or if you have further questions / concerns.   Cholelithiasis Cholelithiasis (also called gallstones) is a form of gallbladder disease where gallstones form in your gallbladder. The gallbladder is a non-essential organ that stores bile made in the liver, which helps digest fats. Gallstones begin as small crystals and slowly grow into stones. Gallstone pain occurs when the gallbladder spasms, and a gallstone is blocking the duct. Pain can also occur when a stone passes out of the duct.  Women are more likely to develop gallstones than men. Other factors that increase the risk of gallbladder disease are:  Having multiple pregnancies. Physicians sometimes advise removing diseased gallbladders before future pregnancies.   Obesity.   Diets heavy in fried foods and fat.   Increasing age (older than 67).   Prolonged use of medications containing female hormones.   Diabetes mellitus.   Rapid weight loss.   Family history of gallstones (heredity).  SYMPTOMS  Feeling sick to your stomach (nauseous).   Abdominal pain.   Yellowing of the skin (jaundice).   Sudden pain. It may persist from several minutes to several hours.   Worsening pain with deep breathing or when jarred.   Fever.   Tenderness to the touch.  In some cases, when gallstones do not move into the bile duct, people have no pain or symptoms. These are called "silent" gallstones. TREATMENT In severe cases, emergency surgery may be required. HOME CARE INSTRUCTIONS   Only take over-the-counter or prescription medicines for pain, discomfort, or fever as directed by your caregiver.   Follow a low-fat diet until seen again. Fat causes the gallbladder to contract, which can  result in pain.   Follow up as instructed. Attacks are almost always recurrent and surgery is usually required for permanent treatment.  SEEK IMMEDIATE MEDICAL CARE IF:   Your pain increases and is not controlled by medications.   You have an oral temperature above 102 F (38.9 C), not controlled by medication.   You develop nausea and vomiting.  MAKE SURE YOU:   Understand these instructions.   Will watch your condition.   Will get help right away if you are not doing well or get worse.  Document Released: 12/21/2004 Document Revised: 12/14/2010 Document Reviewed: 02/23/2010 [facility] Patient Information 2012 [place], [place].

## 2011-05-10 ENCOUNTER — Telehealth (INDEPENDENT_AMBULATORY_CARE_PROVIDER_SITE_OTHER): Payer: Self-pay

## 2011-05-10 NOTE — Telephone Encounter (Signed)
The pt called this am to notify Dr Michaell Cowing that she had decided not to have her gallbladder removed. I will notify Dr Michaell Cowing and call her cardiologist to let them know we will not need cardiac clearance on the pt now.

## 2011-05-15 ENCOUNTER — Telehealth (INDEPENDENT_AMBULATORY_CARE_PROVIDER_SITE_OTHER): Payer: Self-pay

## 2011-05-15 ENCOUNTER — Encounter (INDEPENDENT_AMBULATORY_CARE_PROVIDER_SITE_OTHER): Payer: Self-pay

## 2011-05-15 NOTE — Telephone Encounter (Signed)
Called pt to let her know that I did get her message about not wanting to scheduled her surgery with Dr Michaell Cowing. I did notify DR Michaell Cowing. The pt was cleared from her cardiologist so I will file her clearance in epic if pt changes her mind. I did notify pt that if she changes her mind it has been several months since her last appt here that she will have to come back for another appt to discuss surgery again. The pt understands. The pt said if she did have surgery she would not have Dr Michaell Cowing as the surgeon she would want Dr Magnus Ivan.

## 2011-05-22 ENCOUNTER — Other Ambulatory Visit: Payer: Self-pay | Admitting: Cardiology

## 2011-05-29 ENCOUNTER — Ambulatory Visit (INDEPENDENT_AMBULATORY_CARE_PROVIDER_SITE_OTHER): Payer: Medicare Other | Admitting: General Surgery

## 2011-05-29 ENCOUNTER — Encounter (INDEPENDENT_AMBULATORY_CARE_PROVIDER_SITE_OTHER): Payer: Self-pay | Admitting: General Surgery

## 2011-05-29 VITALS — BP 138/47 | HR 72 | Temp 97.4°F | Ht 67.0 in | Wt 212.0 lb

## 2011-05-29 DIAGNOSIS — K8020 Calculus of gallbladder without cholecystitis without obstruction: Secondary | ICD-10-CM

## 2011-05-29 NOTE — Progress Notes (Signed)
Subjective:     Patient ID: Erika [name], female   DOB: 07/04/1932, 79 y.o.   MRN: [id number]  HPI The patient is a 79-year-old white female who's been experiencing right upper quadrant and epigastric pain for the last 6 weeks. She hurt pretty much every day. The pain seems to be brought almost eating. She has significant nausea with it but no vomiting. She does state that the pain radiates into her back. She had an ultrasound done to evaluate this pain and it revealed stones in her gallbladder and some mild gallbladder wall thickening but no ductal dilatation.  Review of Systems  Constitutional: Negative.   HENT: Negative.   Eyes: Negative.   Respiratory: Negative.   Cardiovascular: Negative.   Gastrointestinal: Positive for nausea, abdominal pain and abdominal distention.  Genitourinary: Negative.   Musculoskeletal: Negative.   Skin: Negative.   Neurological: Negative.   Hematological: Negative.   Psychiatric/Behavioral: Negative.        Objective:   Physical Exam  Constitutional: She is oriented to person, place, and time. She appears well-developed and well-nourished.  HENT:  Head: Normocephalic and atraumatic.  Eyes: Conjunctivae and EOM are normal. Pupils are equal, round, and reactive to light.  Neck: Normal range of motion. Neck supple.  Cardiovascular:  Murmur heard.      Irregular rate and rhythm  Pulmonary/Chest: Effort normal and breath sounds normal.  Abdominal: Soft. Bowel sounds are normal.       Mild to moderate right upper quadrant and epigastric tenderness. No palpable mass.  Musculoskeletal: Normal range of motion.  Neurological: She is alert and oriented to person, place, and time.  Skin: Skin is warm and dry.  Psychiatric: She has a normal mood and affect. Her behavior is normal.       Assessment:     The patient has what sounds like symptom gallstones. Because of the risk of further painful episodes and possible pancreatitis I think she would benefit  from having her gallbladder removed. She would also like to have this done. I have discussed with her in detail the risks and benefits of the operation to remove the gallbladder as well as some of the technical aspects and she understands and wishes to proceed.    Plan:     Plan for laparoscopic cholecystectomy with intraoperative cholangiogram.      

## 2011-05-29 NOTE — Patient Instructions (Signed)
Stop coumadin 5 days before surgery Plan for laparoscopic cholecystectomy with intraoperative cholangiogram

## 2011-06-14 ENCOUNTER — Encounter (HOSPITAL_COMMUNITY): Payer: Self-pay | Admitting: Respiratory Therapy

## 2011-06-20 ENCOUNTER — Inpatient Hospital Stay (HOSPITAL_COMMUNITY): Admission: RE | Admit: 2011-06-20 | Discharge: 2011-06-20 | Payer: Medicare Other | Source: Ambulatory Visit

## 2011-06-20 ENCOUNTER — Encounter (HOSPITAL_COMMUNITY): Payer: Self-pay

## 2011-06-20 HISTORY — DX: Personal history of other diseases of the digestive system: Z87.19

## 2011-06-20 NOTE — Pre-Procedure Instructions (Addendum)
20 Erika [name]  06/20/2011   Your procedure is scheduled on: 06/28/11  Report to [facility] at 530 * AM.  Call this number if you have problems the morning of surgery: [phone]   Remember:   Do not eat food:After Midnight.  May have clear liquids: up to 4 Hours before arrival 130 am.  Clear liquids include soda, tea, black coffee, apple or grape juice, broth.  Take these medicines the morning of surgery with A SIP OF WATER: clarinex, pain med, levothyroxine, omeprazole, zofran STOP omega 3 coumadin sat or per dr   Drucilla Schmidt not wear jewelry, make-up or nail polish.  Do not wear lotions, powders, or perfumes. You may wear deodorant.  Do not shave 48 hours prior to surgery. Men may shave face and neck.  Do not bring valuables to the hospital.  Contacts, dentures or bridgework may not be worn into surgery.  Leave suitcase in the car. After surgery it may be brought to your room.  For patients admitted to the hospital, checkout time is 11:00 AM the day of discharge.   Patients discharged the day of surgery will not be allowed to drive home.  Name and phone number of your driver: Fayrene Fearing [phone] spouse  Special Instructions: CHG Shower Use Special Wash: 1/2 bottle night before surgery and 1/2 bottle morning of surgery.   Please read over the following fact sheets that you were given: Pain Booklet, Coughing and Deep Breathing, MRSA Information and Surgical Site Infection Prevention

## 2011-06-20 NOTE — Progress Notes (Signed)
req'd notes, ekg from dr Donnie Aho office, last visit 4/13

## 2011-06-22 ENCOUNTER — Other Ambulatory Visit: Payer: Self-pay

## 2011-06-22 ENCOUNTER — Emergency Department (HOSPITAL_COMMUNITY): Payer: Medicare Other

## 2011-06-22 ENCOUNTER — Observation Stay (HOSPITAL_COMMUNITY)
Admission: EM | Admit: 2011-06-22 | Discharge: 2011-06-23 | Disposition: A | Discharge status: Home / Self Care | Payer: Medicare Other | Attending: Family Medicine | Admitting: Family Medicine

## 2011-06-22 ENCOUNTER — Encounter (HOSPITAL_COMMUNITY): Payer: Self-pay | Admitting: *Deleted

## 2011-06-22 DIAGNOSIS — E669 Obesity, unspecified: Secondary | ICD-10-CM | POA: Insufficient documentation

## 2011-06-22 DIAGNOSIS — R29810 Facial weakness: Secondary | ICD-10-CM | POA: Insufficient documentation

## 2011-06-22 DIAGNOSIS — I4891 Unspecified atrial fibrillation: Secondary | ICD-10-CM | POA: Insufficient documentation

## 2011-06-22 DIAGNOSIS — R404 Transient alteration of awareness: Secondary | ICD-10-CM | POA: Insufficient documentation

## 2011-06-22 DIAGNOSIS — R55 Syncope and collapse: Secondary | ICD-10-CM | POA: Insufficient documentation

## 2011-06-22 DIAGNOSIS — E785 Hyperlipidemia, unspecified: Secondary | ICD-10-CM | POA: Insufficient documentation

## 2011-06-22 DIAGNOSIS — E871 Hypo-osmolality and hyponatremia: Secondary | ICD-10-CM | POA: Insufficient documentation

## 2011-06-22 DIAGNOSIS — I10 Essential (primary) hypertension: Secondary | ICD-10-CM | POA: Insufficient documentation

## 2011-06-22 DIAGNOSIS — K219 Gastro-esophageal reflux disease without esophagitis: Secondary | ICD-10-CM | POA: Insufficient documentation

## 2011-06-22 DIAGNOSIS — G459 Transient cerebral ischemic attack, unspecified: Principal | ICD-10-CM | POA: Insufficient documentation

## 2011-06-22 DIAGNOSIS — R4789 Other speech disturbances: Secondary | ICD-10-CM | POA: Insufficient documentation

## 2011-06-22 DIAGNOSIS — E890 Postprocedural hypothyroidism: Secondary | ICD-10-CM | POA: Insufficient documentation

## 2011-06-22 DIAGNOSIS — K8020 Calculus of gallbladder without cholecystitis without obstruction: Secondary | ICD-10-CM | POA: Insufficient documentation

## 2011-06-22 DIAGNOSIS — Z7901 Long term (current) use of anticoagulants: Secondary | ICD-10-CM | POA: Insufficient documentation

## 2011-06-22 HISTORY — DX: Unspecified atrial fibrillation: I48.91

## 2011-06-22 HISTORY — DX: Essential (primary) hypertension: I10

## 2011-06-22 HISTORY — DX: Sleep apnea, unspecified: G47.30

## 2011-06-22 HISTORY — DX: Hyperlipidemia, unspecified: E78.5

## 2011-06-22 HISTORY — DX: Calculus of gallbladder without cholecystitis without obstruction: K80.20

## 2011-06-22 HISTORY — DX: Anemia, unspecified: D64.9

## 2011-06-22 HISTORY — DX: Obesity, unspecified: E66.9

## 2011-06-22 HISTORY — DX: Hypothyroidism, unspecified: E03.9

## 2011-06-22 LAB — URINALYSIS, ROUTINE W REFLEX MICROSCOPIC
Glucose, UA: NEGATIVE mg/dL
Hgb urine dipstick: NEGATIVE
Protein, ur: NEGATIVE mg/dL
pH: 7 (ref 5.0–8.0)

## 2011-06-22 LAB — CBC
HCT: 33.1 % — ABNORMAL LOW (ref 36.0–46.0)
Hemoglobin: 11.6 g/dL — ABNORMAL LOW (ref 12.0–15.0)
WBC: 10.8 10*3/uL — ABNORMAL HIGH (ref 4.0–10.5)

## 2011-06-22 LAB — COMPREHENSIVE METABOLIC PANEL
Albumin: 3.8 g/dL (ref 3.5–5.2)
Alkaline Phosphatase: 208 U/L — ABNORMAL HIGH (ref 39–117)
BUN: 18 mg/dL (ref 6–23)
Chloride: 85 mEq/L — ABNORMAL LOW (ref 96–112)
GFR calc Af Amer: 55 mL/min — ABNORMAL LOW (ref >90)
Glucose, Bld: 125 mg/dL — ABNORMAL HIGH (ref 70–99)
Potassium: 4.6 mEq/L (ref 3.5–5.1)
Total Bilirubin: 0.5 mg/dL (ref 0.3–1.2)

## 2011-06-22 LAB — URINE MICROSCOPIC-ADD ON

## 2011-06-22 LAB — PROTIME-INR: Prothrombin Time: 16 seconds — ABNORMAL HIGH (ref 11.6–15.2)

## 2011-06-22 LAB — POCT I-STAT TROPONIN I: Troponin i, poc: 0.01 ng/mL (ref 0.00–0.08)

## 2011-06-22 MED ORDER — SODIUM CHLORIDE 0.9 % IV SOLN
1000.0000 mL | INTRAVENOUS | Status: DC
  Administered 2011-06-22 – 2011-06-23 (×2): 1000 mL via INTRAVENOUS

## 2011-06-22 NOTE — ED Provider Notes (Signed)
History     CSN: [id number]  Arrival date & time 06/22/11  2146   First MD Initiated Contact with Patient 06/22/11 2207      Chief Complaint  Patient presents with  . Loss of Consciousness    HPI Pt was at a wedding ceremony when she suddenly became pale and slumped over.  Pt had a near syncopal episode according to bystanders.  It lasted for 5 minutes.  EMS arrived and she was noted to have normal BP and the symptoms had resolved.  Pt remembers eating and then suddenly felt diaphoretic and hot.  Pt was able to talk the whole time.  She never lost consciousness.  She was feeling nauseated.  She has history of recent uti on abx and gallbladder disease scheduled for surgery this week.  No abdominal pain.  No chest pain or headache.  No vomiting or diarrhea.  Pt is feeling better now.   Past Medical History  Diagnosis Date  . Hyperthyroidism   . GERD (gastroesophageal reflux disease)   . Hypertension   . Seasonal allergies   . Hyperlipidemia   . Obesity   . Arthritis   . Abdominal pain   . Cardiac arrhythmia   . H/O hiatal hernia   . Anemia     Past Surgical History  Procedure Date  . Thyroidectomy 1961    80% removed  . Pharyngeal repair [id number]    Dr [name] for post-TEE pharygeal tear w neck abscess I&D  . Breast surgery 2012 (June or July)    right    Family History  Problem Relation Age of Onset  . Heart disease Mother   . Heart disease Father   . Cancer Maternal Aunt     breast    History  Substance Use Topics  . Smoking status: Never Smoker   . Smokeless tobacco: Never Used  . Alcohol Use: No    OB History    Grav Para Term Preterm Abortions TAB SAB Ect Mult Living                  Review of Systems  Constitutional: Negative for fever and chills.  Gastrointestinal: Negative for abdominal pain.  Neurological: Negative for dizziness.  All other systems reviewed and are negative.    Allergies  Celecoxib; Amiodarone; Aspirin; Dronedarone  hydrochloride; Ezetimibe; Flecainide acetate; and Sulfonamide derivatives  Home Medications   Current Outpatient Rx  Name Route Sig Dispense Refill  . DESLORATADINE 5 MG PO TABS Oral Take 5 mg by mouth daily.      . ERGOCALCIFEROL 50000 UNITS PO CAPS Oral Take 50,000 Units by mouth once a week. Take on thursday    . HYDROCHLOROTHIAZIDE 25 MG PO TABS Oral Take 25 mg by mouth daily.    Marland Kitchen HYDROCODONE-ACETAMINOPHEN 5-325 MG PO TABS Oral Take 1 tablet by mouth every 6 (six) hours as needed. For pain    . LEVOTHYROXINE SODIUM 175 MCG PO TABS Oral Take 175 mcg by mouth daily.      Marland Kitchen LISINOPRIL 10 MG PO TABS Oral Take 30 mg by mouth daily.     . OMEGA-3-ACID ETHYL ESTERS 1 G PO CAPS Oral Take 1 g by mouth 2 (two) times daily.     Marland Kitchen OMEPRAZOLE 20 MG PO CPDR Oral Take 20 mg by mouth daily.      Marland Kitchen ONDANSETRON 8 MG PO TBDP Oral Take 8 mg by mouth daily as needed. For nausea    . POTASSIUM CHLORIDE ER  10 MEQ PO TBCR Oral Take 10 mEq by mouth daily.     . WARFARIN SODIUM 5 MG PO TABS Oral Take 5-7.5 mg by mouth daily. Take 7.5 mg on wednesdays. Take 5 mg the rest of the week.    Marland Kitchen ZOLPIDEM TARTRATE 10 MG PO TABS Oral Take 10 mg by mouth at bedtime as needed. For sleep      BP 184/68  Temp 97.6 F (36.4 C) (Oral)  Resp 18  SpO2 100%  Physical Exam  Nursing note and vitals reviewed. Constitutional: She appears well-developed and well-nourished. No distress.  HENT:  Head: Normocephalic and atraumatic.  Right Ear: External ear normal.  Left Ear: External ear normal.  Eyes: Conjunctivae are normal. Right eye exhibits no discharge. Left eye exhibits no discharge. No scleral icterus.  Neck: Neck supple. No tracheal deviation present.  Cardiovascular: Normal rate and intact distal pulses.  An irregular rhythm present.  Pulmonary/Chest: Effort normal and breath sounds normal. No stridor. No respiratory distress. She has no wheezes. She has no rales.  Abdominal: Soft. Bowel sounds are normal. She  exhibits no distension. There is no tenderness. There is no rebound and no guarding.  Musculoskeletal: She exhibits edema. She exhibits no tenderness.  Neurological: She is alert. She has normal strength. No sensory deficit. Cranial nerve deficit:  no gross defecits noted. She exhibits normal muscle tone. She displays no seizure activity. Coordination normal.  Skin: Skin is warm and dry. No rash noted.  Psychiatric: She has a normal mood and affect.    ED Course  Procedures (including critical care time)  Rate: 71  Rhythm: Atrial fibrillation  QRS Axis: normal  Intervals: Atrial fibrillation  ST/T Wave abnormalities: normal  Conduction Disutrbances:none  Narrative Interpretation: Poor R-wave progression  Old EKG Reviewed: No significant changes  Labs Reviewed  CBC - Abnormal; Notable for the following:    WBC 10.8 (*)     Hemoglobin 11.6 (*)     HCT 33.1 (*)     All other components within normal limits  COMPREHENSIVE METABOLIC PANEL - Abnormal; Notable for the following:    Sodium 121 (*)     Chloride 85 (*)     Glucose, Bld 125 (*)     AST 142 (*)     ALT 286 (*)     Alkaline Phosphatase 208 (*)     GFR calc non Af Amer 48 (*)     GFR calc Af Amer 55 (*)     All other components within normal limits  PROTIME-INR - Abnormal; Notable for the following:    Prothrombin Time 16.0 (*)     All other components within normal limits  URINALYSIS, ROUTINE W REFLEX MICROSCOPIC - Abnormal; Notable for the following:    Leukocytes, UA TRACE (*)     All other components within normal limits  APTT  POCT I-STAT TROPONIN I  URINE MICROSCOPIC-ADD ON   Dg Chest Portable 1 View  06/22/2011  *RADIOLOGY REPORT*  Clinical Data: Loss of consciousness; diaphoresis.  PORTABLE CHEST - 1 VIEW  Comparison: Chest radiograph performed 05/06/2011  Findings: The lungs are well-aerated.  Minimal left basilar atelectasis is noted.  There is no evidence of pleural effusion or pneumothorax.  The  cardiomediastinal silhouette is mildly enlarged; calcification is noted within the aortic arch.  No acute osseous abnormalities are seen.  IMPRESSION: Minimal left basilar atelectasis noted; lungs otherwise clear. Mild cardiomegaly seen.  Original Report Authenticated By: Tonia Ghent, M.D.  1. Near syncope   2. Gallstones       MDM  11:54 PM patient's symptoms abdominal exam is benign. She has no tenderness in the epigastrium or right upper quadrant this time. She does admit however that she has been having some intermittent discomfort still. She is scheduled for surgery this week. She had been taking Coumadin and took her last dose today.  At this time the patient does not appear to need emergent surgery. I think it is possible some of her symptoms were vasovagal associated with her nausea from her gallbladder disease. Patient is hyponatremic and this is somewhat worse than it was before but I doubt that is the acute cause of her near syncopal episode.  Considering her increasing LFTs, I will consult the hospitalist regarding admission for observation with possible surgical consultation for cholecystectomy.        Celene Kras, MD 06/23/11 [phone]

## 2011-06-22 NOTE — ED Notes (Signed)
Pt c/o syncopal episode witnessed duration of 5 mins, pt c/o diaphoretic & lightheaded, pt A&O x4, follows commands, speaks on complete sentences, pt hx of A fib with x 3 cardioversions for A fib with no success, pt scheduled to have cholesectomy on Thurs., pt on cardiac monitor, will continue to monitor, pt denies CP

## 2011-06-23 ENCOUNTER — Encounter (HOSPITAL_COMMUNITY): Payer: Self-pay | Admitting: Family Medicine

## 2011-06-23 ENCOUNTER — Emergency Department (HOSPITAL_COMMUNITY): Payer: Medicare Other

## 2011-06-23 ENCOUNTER — Observation Stay (HOSPITAL_COMMUNITY): Payer: Medicare Other

## 2011-06-23 DIAGNOSIS — R55 Syncope and collapse: Secondary | ICD-10-CM

## 2011-06-23 DIAGNOSIS — I6350 Cerebral infarction due to unspecified occlusion or stenosis of unspecified cerebral artery: Secondary | ICD-10-CM

## 2011-06-23 DIAGNOSIS — I4891 Unspecified atrial fibrillation: Secondary | ICD-10-CM

## 2011-06-23 DIAGNOSIS — K8020 Calculus of gallbladder without cholecystitis without obstruction: Secondary | ICD-10-CM

## 2011-06-23 LAB — COMPREHENSIVE METABOLIC PANEL
Alkaline Phosphatase: 195 U/L — ABNORMAL HIGH (ref 39–117)
BUN: 17 mg/dL (ref 6–23)
CO2: 27 mEq/L (ref 19–32)
Chloride: 87 mEq/L — ABNORMAL LOW (ref 96–112)
Creatinine, Ser: 1.04 mg/dL (ref 0.50–1.10)
GFR calc non Af Amer: 50 mL/min — ABNORMAL LOW (ref >90)
Glucose, Bld: 106 mg/dL — ABNORMAL HIGH (ref 70–99)
Potassium: 4.2 mEq/L (ref 3.5–5.1)
Total Bilirubin: 0.5 mg/dL (ref 0.3–1.2)

## 2011-06-23 LAB — GLUCOSE, CAPILLARY
Glucose-Capillary: 110 mg/dL — ABNORMAL HIGH (ref 70–99)
Glucose-Capillary: 122 mg/dL — ABNORMAL HIGH (ref 70–99)

## 2011-06-23 LAB — CBC
HCT: 33.9 % — ABNORMAL LOW (ref 36.0–46.0)
Hemoglobin: 11.7 g/dL — ABNORMAL LOW (ref 12.0–15.0)
MCH: 28.9 pg (ref 26.0–34.0)
MCHC: 34.5 g/dL (ref 30.0–36.0)
MCV: 83.7 fL (ref 78.0–100.0)
RBC: 4.05 MIL/uL (ref 3.87–5.11)

## 2011-06-23 LAB — RAPID URINE DRUG SCREEN, HOSP PERFORMED
Amphetamines: NOT DETECTED
Benzodiazepines: NOT DETECTED
Cocaine: NOT DETECTED
Opiates: NOT DETECTED
Tetrahydrocannabinol: NOT DETECTED

## 2011-06-23 LAB — HEMOGLOBIN A1C
Hgb A1c MFr Bld: 5.6 % (ref <5.7)
Mean Plasma Glucose: 114 mg/dL (ref <117)

## 2011-06-23 MED ORDER — SODIUM CHLORIDE 0.9 % IV SOLN
INTRAVENOUS | Status: DC
  Administered 2011-06-23: 04:00:00 via INTRAVENOUS

## 2011-06-23 MED ORDER — ATORVASTATIN CALCIUM 40 MG PO TABS: 40.0000 mg | ORAL_TABLET | Freq: Every day | ORAL | Status: DC

## 2011-06-23 MED ORDER — POTASSIUM CHLORIDE ER 10 MEQ PO TBCR
10.0000 meq | EXTENDED_RELEASE_TABLET | Freq: Every day | ORAL | Status: DC
  Administered 2011-06-23: 10 meq via ORAL
  Filled 2011-06-23: qty 1

## 2011-06-23 MED ORDER — HEPARIN SODIUM (PORCINE) 5000 UNIT/ML IJ SOLN
5000.0000 [IU] | Freq: Three times a day (TID) | INTRAMUSCULAR | Status: DC
  Administered 2011-06-23 (×3): 5000 [IU] via SUBCUTANEOUS
  Filled 2011-06-23 (×4): qty 1

## 2011-06-23 MED ORDER — CIPROFLOXACIN HCL 500 MG PO TABS
500.0000 mg | ORAL_TABLET | Freq: Once | ORAL | Status: AC
  Administered 2011-06-23: 500 mg via ORAL
  Filled 2011-06-23: qty 1

## 2011-06-23 MED ORDER — ACETAMINOPHEN 325 MG PO TABS: 650.0000 mg | ORAL_TABLET | ORAL | Status: DC | PRN

## 2011-06-23 MED ORDER — PANTOPRAZOLE SODIUM 40 MG PO TBEC
40.0000 mg | DELAYED_RELEASE_TABLET | Freq: Every day | ORAL | Status: DC
  Administered 2011-06-23: 40 mg via ORAL
  Filled 2011-06-23 (×2): qty 1

## 2011-06-23 MED ORDER — LEVOTHYROXINE SODIUM 175 MCG PO TABS
175.0000 ug | ORAL_TABLET | Freq: Every day | ORAL | Status: DC
  Administered 2011-06-23: 175 ug via ORAL
  Filled 2011-06-23 (×2): qty 1

## 2011-06-23 MED ORDER — ASPIRIN 325 MG PO TABS: 325.0000 mg | ORAL_TABLET | Freq: Every day | ORAL | Status: DC

## 2011-06-23 MED ORDER — ASPIRIN 325 MG PO TABS
325.0000 mg | ORAL_TABLET | Freq: Every day | ORAL | Status: DC
  Administered 2011-06-23: 325 mg via ORAL
  Filled 2011-06-23: qty 1

## 2011-06-23 MED ORDER — ATORVASTATIN CALCIUM 40 MG PO TABS
40.0000 mg | ORAL_TABLET | Freq: Every day | ORAL | Status: DC
  Administered 2011-06-23: 40 mg via ORAL
  Filled 2011-06-23 (×2): qty 1

## 2011-06-23 MED ORDER — ONDANSETRON 4 MG PO TBDP: 8.0000 mg | ORAL_TABLET | Freq: Three times a day (TID) | ORAL | Status: DC | PRN

## 2011-06-23 MED ORDER — LORATADINE 10 MG PO TABS
10.0000 mg | ORAL_TABLET | Freq: Every day | ORAL | Status: DC
  Administered 2011-06-23: 10 mg via ORAL
  Filled 2011-06-23: qty 1

## 2011-06-23 NOTE — Discharge Summary (Addendum)
Pt went home with husband in family  Car in stable condition. Discharge  Instructions given per MD discharged  summary  Orders. . Patient education given with appropriate handouts related to her diagnosis. Pt demonstrated understanding by verbalizing her risk factors , when to call 911 and call for follow up visit with her PCP.

## 2011-06-23 NOTE — Progress Notes (Signed)
*  PRELIMINARY RESULTS* Echocardiogram 2D Echocardiogram has been performed.  Erika Salen [facility] [name] 06/23/2011, 12:29 PM

## 2011-06-23 NOTE — Discharge Summary (Signed)
Physician Discharge Summary  Patient ID: Erika [name] MRN: [id number] DOB/AGE: 76-Mar-1934 76 y.o.  Admit date: 06/22/2011 Discharge date: 06/23/2011  Admission Diagnoses: 1. TIA 2.  Near Syncope 3.  Planned elective surgery (cholecystectomy) 4.  Atrial fibrillation 5.  HTN 6.  HLD 7.  Hyponatremia 8.  Hypothyroidisim Discharge Diagnoses:  1. TIA 2.  Near Syncope 3.  Planned elective surgery (cholecystectomy) 4.  Atrial fibrillation 5.  HTN 6.  HLD 7.  Hyponatremia 8.  Hypothyroidisim  Discharged Condition: stable  Hospital Course:  1. TIA:  Reported facial droop by family, this had resolved by the time she arrived in ED.  No recurrence of symptoms while int he hospital.  Non-contrast head CT and MRI of the brain negative.   She was risk stratified with FLP and HgbA1c.  FLP showed significantly elevated cholesterol at 303 with LDL of 239 and HDL of 52.  She was started on Lipitor 40mg  at time of discharge.  Carotid dopplers and echocardiogram performed and results are pending at time of discharge.  Does have A. Fib and currently coumadin being held in anticipation of elective surgery on 6/20.  She was placed on ASA in hospital, will continue this for now.    2. Near syncope :  Possibly related to TIA, but more likely vagally mediated due to biliary colic.  She did have murmur on exam and as above Echo is pending at time of discharge, do not think her near syncope is cardiac related however.   3. Upcoming plans for cholecystectomy-Still holding coumadin. I realize that perhaps surgeons would not want to operate with aspirin but given concern for TIA, I did not think it wise to not start this therapy.  LFT'S were elevated at time of admission and looked to be coming down at time of discharge.  Possible that she could have passed a gallstone contributing to her symptoms, and she was asymptomatic at time of discharge.  Discussed with Dr. Corliss Skains on call for general surgery and he  recommended proceeding with elective surgery as scheduled.   4. Atrial Fibrillation-Rate controlled, monitored on telemetry during hospitalization.  Coumadin being held in anticipation of upcoming surgery  5. HTN-Home medications held while in hospital in setting of TIA, will restart at discharge  6. HLD-Reported history of intolerance to statins, unsure which she has tried. Takes fish oil. Given significant elevation in lipids, will give statins another try.   Lab Results  Component Value Date   CHOL 303* 06/23/2011   HDL 52 06/23/2011   LDLCALC 239* 06/23/2011   TRIG 61 06/23/2011   CHOLHDL 5.8 06/23/2011     7. Hyponatremia-baseline sodium 125 over last year. 124 at time at discharge, recommend follow up of sodium at appt with PCP  8. Hypothyroidism-TSH low, recommend follow-up post-cholecystectomy when patient is well.   Lab Results  Component Value Date   TSH 0.285* 06/23/2011      Consults: None  Significant Diagnostic Studies:  HEMOGLOBIN A1C     Status: Normal   Collection Time   06/23/11  2:57 AM      Component Value Range   Hemoglobin A1C 5.6  <5.7 %   Mean Plasma Glucose 114  <117 mg/dL  CBC     Status: Abnormal   Collection Time   06/23/11  2:57 AM      Component Value Range   WBC 9.7  4.0 - 10.5 K/uL   RBC 4.05  3.87 - 5.11 MIL/uL   Hemoglobin  11.7 (*) 12.0 - 15.0 g/dL   HCT 16.1 (*) 09.6 - 04.5 %   MCV 83.7  78.0 - 100.0 fL   MCH 28.9  26.0 - 34.0 pg   MCHC 34.5  30.0 - 36.0 g/dL   RDW 40.9  81.1 - 91.4 %   Platelets 282  150 - 400 K/uL  TSH     Status: Abnormal   Collection Time   06/23/11  2:57 AM      Component Value Range   TSH 0.285 (*) 0.350 - 4.500 uIU/mL  COMPREHENSIVE METABOLIC PANEL     Status: Abnormal   Collection Time   06/23/11  2:57 AM      Component Value Range   Sodium 124 (*) 135 - 145 mEq/L   Potassium 4.2  3.5 - 5.1 mEq/L   Chloride 87 (*) 96 - 112 mEq/L   CO2 27  19 - 32 mEq/L   Glucose, Bld 106 (*) 70 - 99 mg/dL   BUN 17  6 - 23  mg/dL   Creatinine, Ser 7.82  0.50 - 1.10 mg/dL   Calcium 9.8  8.4 - 95.6 mg/dL   Total Protein 6.9  6.0 - 8.3 g/dL   Albumin 3.8  3.5 - 5.2 g/dL   AST 213 (*) 0 - 37 U/L   ALT 262 (*) 0 - 35 U/L   Alkaline Phosphatase 195 (*) 39 - 117 U/L   Total Bilirubin 0.5  0.3 - 1.2 mg/dL   GFR calc non Af Amer 50 (*) >90 mL/min   GFR calc Af Amer 59 (*) >90 mL/min  LIPID PANEL     Status: Abnormal   Collection Time   06/23/11  6:35 AM      Component Value Range   Cholesterol 303 (*) 0 - 200 mg/dL   Triglycerides 61  <086 mg/dL   HDL 52  >57 mg/dL   Total CHOL/HDL Ratio 5.8     VLDL 12  0 - 40 mg/dL   LDL Cholesterol 846 (*) 0 - 99 mg/dL  TROPONIN I     Status: Normal   Collection Time   06/23/11  6:35 AM      Component Value Range   Troponin I <0.30  <0.30 ng/mL    Disposition: 01-Home or Self Care  Discharge Orders    Future Appointments: Provider: Department: Dept Phone: Center:   06/25/2011  9:30 AM [facility]  None   07/19/2011 9:40 AM Caleen Essex III, MD [facility] [phone] None     Medication List  As of 06/23/2011  5:46 PM   STOP taking these medications         ciprofloxacin 500 MG tablet      warfarin 5 MG tablet         TAKE these medications         aspirin 325 MG tablet   Take 1 tablet (325 mg total) by mouth daily.      atorvastatin 40 MG tablet   Commonly known as: LIPITOR   Take 1 tablet (40 mg total) by mouth daily at 6 PM.      desloratadine 5 MG tablet   Commonly known as: CLARINEX   Take 5 mg by mouth at bedtime.      ergocalciferol 50000 UNITS capsule   Commonly known as: VITAMIN D2   Take 50,000 Units by mouth once a week. Take on thursday      hydrochlorothiazide 25  MG tablet   Commonly known as: HYDRODIURIL   Take 25 mg by mouth daily.      HYDROcodone-acetaminophen 5-325 MG per tablet   Commonly known as: NORCO   Take 1 tablet by mouth every 6 (six) hours as needed. For pain      levothyroxine 175 MCG  tablet   Commonly known as: SYNTHROID, LEVOTHROID   Take 175 mcg by mouth every morning.      lisinopril 10 MG tablet   Commonly known as: PRINIVIL,ZESTRIL   Take 30 mg by mouth daily.      omega-3 acid ethyl esters 1 G capsule   Commonly known as: LOVAZA   Take 1 g by mouth 2 (two) times daily.      omeprazole 20 MG capsule   Commonly known as: PRILOSEC   Take 20 mg by mouth 2 (two) times daily.      ondansetron 8 MG disintegrating tablet   Commonly known as: ZOFRAN-ODT   Take 8 mg by mouth daily as needed. For nausea      potassium chloride 10 MEQ tablet   Commonly known as: K-DUR   Take 10 mEq by mouth daily.      zolpidem 10 MG tablet   Commonly known as: AMBIEN   Take 10 mg by mouth at bedtime as needed. For sleep            Follow-up Information    Schedule an appointment as soon as possible for a visit with [name], MD.   Contact information:   [address] [name] [address] [phone]           Signed: Margeret Stachnik 06/23/2011, 2:15 PM

## 2011-06-23 NOTE — ED Notes (Signed)
Admit MD at bedside

## 2011-06-23 NOTE — H&P (Signed)
Family Medicine Teaching [facility] Admission History and Physical  Patient name: Erika [name] Medical record number: [id number] Date of birth: Jul 01, 1932 Age: 78 y.o. Gender: female  Primary Care Provider: [name], MD, family physician in [place]  Chief Complaint: facial droop and slurred speech  History of Present Illness: Erika [name] is a 78 y.o. year old female with a history of atrial fibrillation, HTN, HLD, plan for cholecystectomy on June 15th due to symptomatic gallstones presenting with facial droop and slurred speech.   Patient at her granddaughters wedding this evening. She sat down to eat dinner and had several small bites of food including some cheese and crackers. Shortly thereafter, she became nauseous as well as very hot with associated diaphoresis. She denies chest pain, shortness of breath, or abdominal pain during this time. She said the heat, and nausea felt very similar to 2 weeks ago when she was seen for biliary colic except for the lack of abdominal pain. She began to slump down in her seat with her head down. Family noted a right sided facial droop. They said she never passed out and continued to communicate but that words seemed slurred. Total event lasted about 5 minutes. EMS was called and all symptoms had resolved by the time they arrived.   Patient seen in ED. Per ED report, patient to be admitted for presyncope in setting of cardiac dysrhythmia and concern that of possible vasovagal episode revolving around possible biliary colic. Slurred speech/unilateral facial weakness were not initially reported by family. No signs for emergent surgery for abdomen. FMTS consulted for admission. Patient was going for head CT at end of my interview.   Please note patient has planned  Cholecystectomy on June 20th and has been instructed to hold her coumadin. INR 1.25 at this time. Rate controlled a fib.    Past Medical History  Diagnosis Date  .  Hyperthyroidism s/p partial thyroidectomy, now hypothyroid   . GERD (gastroesophageal reflux disease)   . Seasonal allergies   . Arthritis   . H/O hiatal hernia   . HYPERLIPIDEMIA 06/15/2009  . OBESITY 06/15/2009  . ANEMIA 06/15/2009  . HYPERTENSION 06/15/2009  . Atrial fibrillation 06/15/2009  . SLEEP APNEA 06/16/2009  . Gallstones, probable biliary colic 05/09/2011    Past Surgical History  Procedure Date  . Thyroidectomy 1961    80% removed  . Pharyngeal repair [id number]    Dr [name] for post-TEE pharygeal tear w neck abscess I&D  . Breast surgery 2012 (June or July)    right    Family History  Problem Relation Age of Onset  . Heart disease Mother   . Heart disease Father   . Cancer Maternal Aunt     breast   Social History:  reports that she has never smoked. She has never used smokeless tobacco. She reports that she does not drink alcohol or use illicit drugs.  Allergies:  Allergies  Allergen Reactions  . Celecoxib Hives and Itching    Face only  . Amiodarone     Ineffective   . Dronedarone Hydrochloride     Edema   . Ezetimibe     Fatigue   . Flecainide Acetate     fatigue  . Sulfonamide Derivatives Rash    "Mouth breaks out"    No current facility-administered medications on file prior to encounter.   Current Outpatient Prescriptions on File Prior to Encounter  Medication Sig Dispense Refill  . desloratadine (CLARINEX) 5 MG tablet Take 5  mg by mouth at bedtime.       . ergocalciferol (VITAMIN D2) 50000 UNITS capsule Take 50,000 Units by mouth once a week. Take on thursday      . hydrochlorothiazide (HYDRODIURIL) 25 MG tablet Take 25 mg by mouth daily.      Marland Kitchen HYDROcodone-acetaminophen (NORCO) 5-325 MG per tablet Take 1 tablet by mouth every 6 (six) hours as needed. For pain      . levothyroxine (SYNTHROID, LEVOTHROID) 175 MCG tablet Take 175 mcg by mouth every morning.       Marland Kitchen lisinopril (PRINIVIL,ZESTRIL) 10 MG tablet Take 30 mg by mouth daily.       Marland Kitchen omega-3  acid ethyl esters (LOVAZA) 1 G capsule Take 1 g by mouth 2 (two) times daily.       Marland Kitchen omeprazole (PRILOSEC) 20 MG capsule Take 20 mg by mouth 2 (two) times daily.       . ondansetron (ZOFRAN-ODT) 8 MG disintegrating tablet Take 8 mg by mouth daily as needed. For nausea      . potassium chloride (K-DUR) 10 MEQ tablet Take 10 mEq by mouth daily.       Marland Kitchen warfarin (COUMADIN) 5 MG tablet Take 5-7.5 mg by mouth daily. Take 7.5 mg on wednesdays. Take 5 mg the rest of the week.      . zolpidem (AMBIEN) 10 MG tablet Take 10 mg by mouth at bedtime as needed. For sleep         Results for orders placed during the hospital encounter of 06/22/11 (from the past 48 hour(s))  CBC     Status: Abnormal   Collection Time   06/22/11 10:31 PM      Component Value Range Comment   WBC 10.8 (*) 4.0 - 10.5 K/uL    RBC 4.02  3.87 - 5.11 MIL/uL    Hemoglobin 11.6 (*) 12.0 - 15.0 g/dL    HCT 40.9 (*) 81.1 - 46.0 %    MCV 82.3  78.0 - 100.0 fL    MCH 28.9  26.0 - 34.0 pg    MCHC 35.0  30.0 - 36.0 g/dL    RDW 91.4  78.2 - 95.6 %    Platelets 262  150 - 400 K/uL   COMPREHENSIVE METABOLIC PANEL     Status: Abnormal   Collection Time   06/22/11 10:31 PM      Component Value Range Comment   Sodium 121 (*) 135 - 145 mEq/L    Potassium 4.6  3.5 - 5.1 mEq/L    Chloride 85 (*) 96 - 112 mEq/L    CO2 26  19 - 32 mEq/L    Glucose, Bld 125 (*) 70 - 99 mg/dL    BUN 18  6 - 23 mg/dL    Creatinine, Ser 2.13  0.50 - 1.10 mg/dL    Calcium 9.8  8.4 - 08.6 mg/dL    Total Protein 7.0  6.0 - 8.3 g/dL    Albumin 3.8  3.5 - 5.2 g/dL    AST 578 (*) 0 - 37 U/L    ALT 286 (*) 0 - 35 U/L    Alkaline Phosphatase 208 (*) 39 - 117 U/L    Total Bilirubin 0.5  0.3 - 1.2 mg/dL    GFR calc non Af Amer 48 (*) >90 mL/min    GFR calc Af Amer 55 (*) >90 mL/min   PROTIME-INR     Status: Abnormal   Collection Time   06/22/11 10:31 PM  Component Value Range Comment   Prothrombin Time 16.0 (*) 11.6 - 15.2 seconds    INR 1.25  0.00 - 1.49    APTT     Status: Normal   Collection Time   06/22/11 10:31 PM      Component Value Range Comment   aPTT 33  24 - 37 seconds   POCT I-STAT TROPONIN I     Status: Normal   Collection Time   06/22/11 10:51 PM      Component Value Range Comment   Troponin i, poc 0.01  0.00 - 0.08 ng/mL    Comment 3            URINALYSIS, ROUTINE W REFLEX MICROSCOPIC     Status: Abnormal   Collection Time   06/22/11 11:09 PM      Component Value Range Comment   Color, Urine YELLOW  YELLOW    APPearance CLEAR  CLEAR    Specific Gravity, Urine 1.011  1.005 - 1.030    pH 7.0  5.0 - 8.0    Glucose, UA NEGATIVE  NEGATIVE mg/dL    Hgb urine dipstick NEGATIVE  NEGATIVE    Bilirubin Urine NEGATIVE  NEGATIVE    Ketones, ur NEGATIVE  NEGATIVE mg/dL    Protein, ur NEGATIVE  NEGATIVE mg/dL    Urobilinogen, UA 0.2  0.0 - 1.0 mg/dL    Nitrite NEGATIVE  NEGATIVE    Leukocytes, UA TRACE (*) NEGATIVE   URINE MICROSCOPIC-ADD ON     Status: Normal   Collection Time   06/22/11 11:09 PM      Component Value Range Comment   Squamous Epithelial / LPF RARE  RARE    WBC, UA 3-6  <3 WBC/hpf    Bacteria, UA RARE  RARE    Ct Head Wo Contrast  06/23/2011  *RADIOLOGY REPORT*  Clinical Data: Syncope.  Diaphoresis.  CT HEAD WITHOUT CONTRAST  Technique:  Contiguous axial images were obtained from the base of the skull through the vertex without contrast.  Comparison: None  Findings: There is no acute hemorrhage or appreciable mass lesion. There is a vague area of poorly defined white matter lucency high in the left parietal lobe.  The remainder the brain parenchyma is normal.  No osseous abnormality.  IMPRESSION: Vague area of focal white matter lucency high in the left parietal lobe.  No mass effect.    This probably represents focal small vessel ischemic disease.  However, the focality is atypical and an MRI on an elective basis may be useful for further evaluation.  Original Report Authenticated By: Gwynn Burly, M.D.   Dg  Chest Portable 1 View  06/22/2011  *RADIOLOGY REPORT*  Clinical Data: Loss of consciousness; diaphoresis.  PORTABLE CHEST - 1 VIEW  Comparison: Chest radiograph performed 05/06/2011  Findings: The lungs are well-aerated.  Minimal left basilar atelectasis is noted.  There is no evidence of pleural effusion or pneumothorax.  The cardiomediastinal silhouette is mildly enlarged; calcification is noted within the aortic arch.  No acute osseous abnormalities are seen.  IMPRESSION: Minimal left basilar atelectasis noted; lungs otherwise clear. Mild cardiomegaly seen.  Original Report Authenticated By: Tonia Ghent, M.D.   EKG-a fib. Poor r wave progression. Unchanged from previous EKG.   ROS- see HPI  Blood pressure 175/62, temperature 97.6 F (36.4 C), temperature source Oral, resp. rate 18, SpO2 98.00%. Physical Exam  Constitutional: She is oriented to person, place, and time. She appears well-developed and well-nourished. No distress.  HENT:  Head: Normocephalic and atraumatic.  Mouth/Throat: Oropharynx is clear and moist.       Torus palatinus noted.   Eyes: EOM are normal. Pupils are equal, round, and reactive to light. No scleral icterus.  Neck: Normal range of motion. Neck supple. No thyromegaly present.  Cardiovascular: Normal rate and regular rhythm.  Exam reveals no gallop and no friction rub.   Murmur (2/6 SEM) heard. Respiratory: Effort normal and breath sounds normal. She has no wheezes. She has no rales.  GI: Soft. Bowel sounds are normal. She exhibits no distension and no mass. There is no tenderness. There is no rebound and no guarding.       Benign abdomen.   Musculoskeletal: Normal range of motion. She exhibits no edema.  Lymphadenopathy:    She has no cervical adenopathy.  Neurological: She is alert and oriented to person, place, and time. She displays normal reflexes. No cranial nerve deficit. She exhibits normal muscle tone. Coordination normal.       5/5 muscle strength in  upper and lower extremities.   Skin: Skin is warm and dry. She is not diaphoretic.     Assessment/Plan  78 y.o. year old female with a history of atrial fibrillation, HTN, HLD, plan for cholecystectomy on June 15th due to symptomatic gallstones for which her coumadin is being held presenting with facial droop and slurred speech.  1. Unilateral facial droop and slurred speech-ABCD2 score of 4 warrants observation given 2 day risk of 4% and 7 day of approximately 6%. Given symptoms, I  suspect patient had a TIA while sub therapeutic with INR while preparing for surgery and not on aspirin.   -CT head negative for acute intracranial events. Vague opacity in left upper lobe linked to likely small vessel ischemic disease. Suggested MRI elective follow up.   -MRI ordered for tomorrow  -carotid dopplers ordered  -2-d echo ordered. Doubt clot formation with only recent cessation of coumadin reportedly only stopping today, but patient is subtherapeutic. Additional indications include 2/6 SEM which could have contributed to near syncope if AS.   -neuro floor with neuro checks. Monitor on telemetry.   -have started aspirin 325mg .   -will allow permissive hypertension up to 220/110 due to concern of TIA. Hold home BP meds x 24 hours from dinner on 6/14.   -risk stratification with a1c, lipids  2. Near syncope  -TIA leads my differential at this time.   - It is possible that patient had a vagal/situational episode related to biliary colic or swallowing but patient was without abdominal pain. Feeling of heat and nausea were only symptoms when previously abdominal pain was symptom   -patient reaquested to discuss moving up cholecystectomy but in light of concern with #1, I feel that it would not be prudent to try to move up surgery.   -patient did have a bump in LFTs-it is possible she was passing a gallstone at time but once again given facial droop, think TIA more likely  -From cardiac perspective, will  monitor on telemetry for any bradycardia or tachucardia. Patient currently rate controlled while not on beta blocker or other medication to control rate. Will also get troponin with AM labs though doubt MI. No chest pain.    -Patient does have poor r wave progression on EKG but this is not new.    -given 2/6 SEM, 2-d echo to assess for aortic stenosis    -risk stratification with a1c, lipids, tsh  -will obtain orthostatics. Does not appear dry  on exam. Other thoguhts-no vertigo and patient not hypoglycemic. No loss of bowel or bladder or rhythmic movements to suggest seizure.   -UDS pending per ED.  Patient denies alcohol use.    3. Upcoming plans for cholecystectomy-Still holding coumadin. I realize that perhaps surgeons would not want to operate with aspirin but given my concern for TIA and high ABCD2 score, I did not think it wise to not start this therapy.   -possible that patient passed a stone today and had temporary bump in LFTS. Will monitor with AM labs.   -continue to hold coumadin.   4. Atrial Fibrillation-currently rate controlled. Will monitor on telemetry.   5. HTN- holding home HCTZ and lisinopril per #1.   6. HLD-intolerant to statins. Takes fish oil. Given TIA, will recheck lipids and encourage another attempt at statins.  7. UTI-patient has 1 more dose of cipro per recent UTI, will continue treatment.   8. Hyponatremia-baseline sodium 125 over last year. 121 today. Will give NS at 38ml/hr and recheck Am electrolytes  9. Hypothyroidism-check TSH.   FEN/GI-RN swallow screen then advance as tolerated to heart healthy diet. IVF with NS at 75 ml/hr.  PPx-heparin SQ. Home PPI for GERD. Continue claritin for seasonal allergies.  Disposition-pending TIA workup. Patient will not need to stay for results.   HUNTER, STEPHEN 06/23/2011, 1:16 AM   PGY2 Addendum:  I have seen and evaluated this patient, and agree with Dr. Erasmo Leventhal History, Physical, and Assessment and plan.    Amore Ackman 06/23/2011 1:30 AM

## 2011-06-23 NOTE — Discharge Instructions (Signed)
Continue to hold your coumadin for now, take only aspirin.  We have prescribed a medication for cholesterol, try taking to see if you tolerate this.  Please make a follow-up appointment with your primary care doctor.   STROKE/TIA DISCHARGE INSTRUCTIONS SMOKING Cigarette smoking nearly doubles your risk of having a stroke & is the single most alterable risk factor  If you smoke or have smoked in the last 12 months, you are advised to quit smoking for your health.  Most of the excess cardiovascular risk related to smoking disappears within a year of stopping.  Ask you doctor about anti-smoking medications  [place] Quit Line: 1-800-QUIT NOW  Free Smoking Cessation Classes [phone]  CHOLESTEROL Know your levels; limit fat & cholesterol in your diet  Lipid Panel     Component Value Date/Time   CHOL 303* 06/23/2011 0635   TRIG 61 06/23/2011 0635   HDL 52 06/23/2011 0635   CHOLHDL 5.8 06/23/2011 0635   VLDL 12 06/23/2011 0635   LDLCALC 239* 06/23/2011 0635      Many patients benefit from treatment even if their cholesterol is at goal.  Goal: Total Cholesterol (CHOL) less than 160  Goal:  Triglycerides (TRIG) less than 150  Goal:  HDL greater than 40  Goal:  LDL (LDLCALC) less than 100   BLOOD PRESSURE American Stroke Association blood pressure target is less that 120/80 mm/Hg  Your discharge blood pressure is:  BP: 128/64 mmHg  Monitor your blood pressure  Limit your salt and alcohol intake  Many individuals will require more than one medication for high blood pressure  DIABETES (A1c is a blood sugar average for last 3 months) Goal HGBA1c is under 7% (HBGA1c is blood sugar average for last 3 months)  Diabetes: No known diagnosis of diabetes    Lab Results  Component Value Date   HGBA1C 5.6 06/23/2011     Your HGBA1c can be lowered with medications, healthy diet, and exercise.  Check your blood sugar as directed by your physician  Call your physician if you experience  unexplained or low blood sugars.  PHYSICAL ACTIVITY/REHABILITATION Goal is 30 minutes at least 4 days per week  Activity: No restrictions.   Activity decreases your risk of heart attack and stroke and makes your heart stronger.  It helps control your weight and blood pressure; helps you relax and can improve your mood.  Participate in a regular exercise program.  Talk with your doctor about the best form of exercise for you (dancing, walking, swimming, cycling).  DIET/WEIGHT Goal is to maintain a healthy weight  Your discharge diet is: Cardiac Your height is:  Height: 5\' 7"  (170.2 cm) Your current weight is: Weight: 203 lb 11.3 oz (92.4 kg) Your Body Mass Index (BMI) is:  BMI (Calculated): 32   Following the type of diet specifically designed for you will help prevent another stroke  Your goal Body Mass Index (BMI) is 19-24.  Healthy food habits can help reduce 3 risk factors for stroke:  High cholesterol, hypertension, and excess weight.  RESOURCES Stroke/Support Group:  Call [phone]   STROKE EDUCATION PROVIDED/REVIEWED AND GIVEN TO PATIENT Stroke warning signs and symptoms How to activate emergency medical system (call 911). Medications prescribed at discharge. Need for follow-up after discharge. Personal risk factors for stroke.   My questions have been answered, the writing is legible, and I understand these instructions.  I will adhere to these goals & educational materials that have been provided to me after my discharge  from the hospital.

## 2011-06-23 NOTE — Progress Notes (Signed)
Discussed with Dr. Corliss Skains, on call for [facility].  Recommends d/c home after work-up complete and keep appointment as scheduled for elective surgery.

## 2011-06-23 NOTE — Progress Notes (Addendum)
VASCULAR LAB PRELIMINARY  PRELIMINARY  PRELIMINARY  PRELIMINARY  Carotid Dopplers completed.    Preliminary report:  There is 40-59% ICA stenosis, low to mid range of scale, bilaterally, by velocity.  However, plaque morphology does not support stenosis and may be due to vessel tortuosity.  Vertebral artery flow is antegrade.  Sherren Kerns [place], 06/23/2011, 7:06 PM

## 2011-06-23 NOTE — H&P (Signed)
Seen and examined. Discussed with Dr. Lula Olszewski.  Agree with her management. 78 yo female admitted with syncope.  I now have the benefit of knowing the MRI of brain shows no acute infarct.  Her history is typical of a vasovagal attack likely brought on by biliary colic.  We will complete TIA workup and consult surg.  Dispo depends on surg.  They may wish to do cholescystectomy while in house.  If not, DC post completion of WU with plan to return for elective GB surg on Thursday 6//20.

## 2011-06-24 NOTE — Discharge Summary (Signed)
Seen and examined on the day of discharge.  Agree with DC documentation and management by Dr. Ashley Royalty.

## 2011-06-25 ENCOUNTER — Encounter (HOSPITAL_COMMUNITY)
Admission: RE | Admit: 2011-06-25 | Discharge: 2011-06-25 | Disposition: A | Discharge status: Home / Self Care | Payer: Medicare Other | Source: Ambulatory Visit | Attending: General Surgery | Admitting: General Surgery

## 2011-06-25 ENCOUNTER — Ambulatory Visit (HOSPITAL_COMMUNITY)
Admission: RE | Admit: 2011-06-25 | Discharge: 2011-06-25 | Disposition: A | Discharge status: Home / Self Care | Payer: Medicare Other | Source: Ambulatory Visit | Attending: General Surgery | Admitting: General Surgery

## 2011-06-25 DIAGNOSIS — Z01818 Encounter for other preprocedural examination: Secondary | ICD-10-CM | POA: Insufficient documentation

## 2011-06-25 DIAGNOSIS — M899 Disorder of bone, unspecified: Secondary | ICD-10-CM | POA: Insufficient documentation

## 2011-06-25 DIAGNOSIS — Z01812 Encounter for preprocedural laboratory examination: Secondary | ICD-10-CM | POA: Insufficient documentation

## 2011-06-25 DIAGNOSIS — M949 Disorder of cartilage, unspecified: Secondary | ICD-10-CM | POA: Insufficient documentation

## 2011-06-25 LAB — CBC
MCH: 28.8 pg (ref 26.0–34.0)
MCV: 85.7 fL (ref 78.0–100.0)
Platelets: 300 10*3/uL (ref 150–400)
RDW: 14.1 % (ref 11.5–15.5)
WBC: 10 10*3/uL (ref 4.0–10.5)

## 2011-06-25 LAB — SURGICAL PCR SCREEN
MRSA, PCR: NEGATIVE
Staphylococcus aureus: NEGATIVE

## 2011-06-25 LAB — BASIC METABOLIC PANEL
Calcium: 9.7 mg/dL (ref 8.4–10.5)
Creatinine, Ser: 1.01 mg/dL (ref 0.50–1.10)
GFR calc Af Amer: 61 mL/min — ABNORMAL LOW (ref >90)
Sodium: 125 mEq/L — ABNORMAL LOW (ref 135–145)

## 2011-06-25 MED ORDER — CHLORHEXIDINE GLUCONATE 4 % EX LIQD: 1.0000 "application " | Freq: Once | CUTANEOUS | Status: DC

## 2011-06-25 NOTE — Pre-Procedure Instructions (Signed)
20 Erika [name]  06/25/2011   Your procedure is scheduled on:  Thurs, June 20 @ 7:30 AM  Report to [facility] at 5:30 AM.  Call this number if you have problems the morning of surgery: [phone]   Remember:   Do not eat food:After Midnight.    Take these medicines the morning of surgery with A SIP OF WATER: Clarinex(Desloratadine),Pain Pill(if needed),Levothyroxine(Synthroid),and Omeprazole(Prilosec)   Do not wear jewelry, make-up or nail polish.  Do not wear lotions, powders, or perfumes.   Do not shave 48 hours prior to surgery.   Do not bring valuables to the hospital.  Contacts, dentures or bridgework may not be worn into surgery.  Leave suitcase in the car. After surgery it may be brought to your room.  For patients admitted to the hospital, checkout time is 11:00 AM the day of discharge.   Patients discharged the day of surgery will not be allowed to drive home.  Special Instructions: CHG Shower Use Special Wash: 1/2 bottle night before surgery and 1/2 bottle morning of surgery.   Please read over the following fact sheets that you were given: Pain Booklet, Coughing and Deep Breathing, MRSA Information and Surgical Site Infection Prevention

## 2011-06-26 ENCOUNTER — Encounter (HOSPITAL_COMMUNITY): Payer: Self-pay | Admitting: Vascular Surgery

## 2011-06-26 NOTE — Consult Note (Signed)
Anesthesia Chart Review:  Patient is a 78 year old female scheduled for laparoscopic cholecystectomy on 06/28/11 by Dr. Carolynne Edouard.  History includes non-smoker, HTN, HLD, anemia, hiatal hernia, GERD, obesity with BMI ~33, hypothyroidism, OSA, afib, moderate AS by 06/2011 echo, arthritis.  Of note, she was hospitalized from 6/14-6/15/13 for TIA (facial droop and slurred speech) and near syncope.  Subsequently the Hospitalist felt she should not stop her ASA prior to surgery.  Her Coumadin was held as planned.  (Her INR was subtherapeutic at the time of her TIA).  According to notes, on-call General Surgeon Dr. Corliss Skains was notified of patient's admission.  Her PCP is Dr. [name].    Her Cardiologist is Dr. Donnie Aho.  He cleared her from this procedure on 05/09/11.  I'm still awaiting records from his office, but his clearance note and prior echo are available for review in Epic.    EKG on 06/22/11 showed afib @ 71 bpm, poor r wave progression anterior leads.  It was not significantly changed since 05/06/11.       2D echo on 06/23/11 showed; - Left ventricle: The cavity size was normal. Systolic function was normal. The estimated ejection fraction was in the range of 50% to 55%. Wall motion was normal; there were no regional wall motion abnormalities. - Aortic valve: There was moderate stenosis. Trivial regurgitation. Valve area: 1.69cm^2(VTI). Valve area: 1.39cm^2 (Vmax). - Mitral valve: Moderately fibrotic annulus. Mild regurgitation. Valve area by pressure half-time: 2.47cm^2. Valve area by continuity equation (using LVOT flow): 1.83cm^2. - Left atrium: The atrium was mildly to moderately dilated. - Pulmonary arteries: Systolic pressure was mildly increased. PA peak pressure: 36mm Hg (S). Impressions: No cardiac source of embolism was identified, but cannot be ruled out on the basis of this examination. Recommendations: Consider transesophageal echocardiography if clinically indicated.  (No AS was noted  on a previous echo from 05/04/09, although she did have moderate AV calcification with mild AR.)  She had 40-59% bilateral ICA stenosis by carotid duplex on 06/23/11.  Head CT on 06/23/11 showed vague area of focal white matter lucency high in the left parietal lobe. No mass effect. This probably represents focal small vessel ischemic disease. However, the focality is atypical and an MRI on an elective basis may be useful for further evaluation.  CXR on 06/25/11 showed biapical scarring. Retrocardiac scarring versus atelectasis. Otherwise, no radiographic evidence of acute cardiopulmonary process. Cardiomegaly. Aortic atherosclerosis.   Labs from 06/22/11 - 06/25/11 were noted.  Na on 06/25/11 was 125, Cl 87.  Na was up from 121 and 124 from her 06/23/11 hospitalization.  (According to labs in Epic, her Cl has been 85-90 and her Na has not been > 126 since June 2012).  She is on HCTZ.  Her LFTs on 06/23/11 were significantly elevated with an AST 112 and ALT 262.  (On 06/22/11 her AST/ALT were 142/286, but were 83/40 on 05/06/11.) Total Bilirubin is WNL on 06/23/11.  According to her discharge summary, Dr. Corliss Skains was notified of her elevated LFTs, and he recommended proceeding as planned.   PT/INR 16.0/1.24 and PTT 33 on 06/22/11.  Reviewed above with Anesthesiologist Dr. Jacklynn Bue.  Since her NaCl are low but have been stable for the past year, would not plan to repeat labs pre-operatively.      Shonna Chock, PA-C

## 2011-06-27 MED ORDER — CEFAZOLIN SODIUM-DEXTROSE 2-3 GM-% IV SOLR
2.0000 g | INTRAVENOUS | Status: AC
  Administered 2011-06-28: 2 g via INTRAVENOUS
  Filled 2011-06-27: qty 50

## 2011-06-28 ENCOUNTER — Encounter (HOSPITAL_COMMUNITY): Payer: Self-pay | Admitting: Vascular Surgery

## 2011-06-28 ENCOUNTER — Encounter (HOSPITAL_COMMUNITY): Payer: Self-pay | Admitting: General Practice

## 2011-06-28 ENCOUNTER — Ambulatory Visit (HOSPITAL_COMMUNITY)
Admission: RE | Admit: 2011-06-28 | Discharge: 2011-06-29 | Disposition: A | Discharge status: Home / Self Care | Payer: Medicare Other | Source: Ambulatory Visit | Attending: General Surgery | Admitting: General Surgery

## 2011-06-28 ENCOUNTER — Encounter (HOSPITAL_COMMUNITY)
Admission: RE | Disposition: A | Discharge status: Home / Self Care | Payer: Self-pay | Source: Ambulatory Visit | Attending: General Surgery

## 2011-06-28 ENCOUNTER — Ambulatory Visit (HOSPITAL_COMMUNITY): Payer: Medicare Other

## 2011-06-28 ENCOUNTER — Encounter (HOSPITAL_COMMUNITY): Payer: Self-pay | Admitting: *Deleted

## 2011-06-28 ENCOUNTER — Ambulatory Visit (HOSPITAL_COMMUNITY): Payer: Medicare Other | Admitting: Vascular Surgery

## 2011-06-28 DIAGNOSIS — G4730 Sleep apnea, unspecified: Secondary | ICD-10-CM | POA: Insufficient documentation

## 2011-06-28 DIAGNOSIS — K8020 Calculus of gallbladder without cholecystitis without obstruction: Secondary | ICD-10-CM | POA: Insufficient documentation

## 2011-06-28 DIAGNOSIS — K219 Gastro-esophageal reflux disease without esophagitis: Secondary | ICD-10-CM | POA: Insufficient documentation

## 2011-06-28 DIAGNOSIS — I4891 Unspecified atrial fibrillation: Secondary | ICD-10-CM | POA: Insufficient documentation

## 2011-06-28 DIAGNOSIS — I10 Essential (primary) hypertension: Secondary | ICD-10-CM | POA: Insufficient documentation

## 2011-06-28 DIAGNOSIS — E039 Hypothyroidism, unspecified: Secondary | ICD-10-CM | POA: Insufficient documentation

## 2011-06-28 DIAGNOSIS — K8010 Calculus of gallbladder with chronic cholecystitis without obstruction: Secondary | ICD-10-CM

## 2011-06-28 DIAGNOSIS — K449 Diaphragmatic hernia without obstruction or gangrene: Secondary | ICD-10-CM | POA: Insufficient documentation

## 2011-06-28 HISTORY — PX: CHOLECYSTECTOMY: SHX55

## 2011-06-28 SURGERY — LAPAROSCOPIC CHOLECYSTECTOMY WITH INTRAOPERATIVE CHOLANGIOGRAM
Anesthesia: General | Site: Abdomen | Wound class: Clean Contaminated

## 2011-06-28 MED ORDER — ZOLPIDEM TARTRATE 5 MG PO TABS: 5.0000 mg | ORAL_TABLET | Freq: Every evening | ORAL | Status: DC | PRN

## 2011-06-28 MED ORDER — POTASSIUM CHLORIDE ER 10 MEQ PO TBCR
10.0000 meq | EXTENDED_RELEASE_TABLET | Freq: Every day | ORAL | Status: DC
  Administered 2011-06-28 – 2011-06-29 (×2): 10 meq via ORAL
  Filled 2011-06-28 (×2): qty 1

## 2011-06-28 MED ORDER — MORPHINE SULFATE 4 MG/ML IJ SOLN: 4.0000 mg | INTRAMUSCULAR | Status: DC | PRN

## 2011-06-28 MED ORDER — BUPIVACAINE-EPINEPHRINE 0.25% -1:200000 IJ SOLN
INTRAMUSCULAR | Status: DC | PRN
  Administered 2011-06-28: 25 mL

## 2011-06-28 MED ORDER — PROMETHAZINE HCL 25 MG/ML IJ SOLN: 6.2500 mg | INTRAMUSCULAR | Status: DC | PRN

## 2011-06-28 MED ORDER — ONDANSETRON 8 MG PO TBDP
8.0000 mg | ORAL_TABLET | Freq: Three times a day (TID) | ORAL | Status: DC | PRN
  Filled 2011-06-28: qty 1

## 2011-06-28 MED ORDER — GLUCAGON HCL (RDNA) 1 MG IJ SOLR
INTRAMUSCULAR | Status: AC
  Filled 2011-06-28: qty 1

## 2011-06-28 MED ORDER — LORATADINE 10 MG PO TABS
10.0000 mg | ORAL_TABLET | Freq: Every day | ORAL | Status: DC
  Administered 2011-06-28 – 2011-06-29 (×2): 10 mg via ORAL
  Filled 2011-06-28 (×2): qty 1

## 2011-06-28 MED ORDER — ASPIRIN 325 MG PO TABS
325.0000 mg | ORAL_TABLET | Freq: Every day | ORAL | Status: DC
  Administered 2011-06-29: 325 mg via ORAL
  Filled 2011-06-28: qty 1

## 2011-06-28 MED ORDER — DEXAMETHASONE SODIUM PHOSPHATE 10 MG/ML IJ SOLN
INTRAMUSCULAR | Status: DC | PRN
  Administered 2011-06-28: 4 mg via INTRAVENOUS

## 2011-06-28 MED ORDER — KCL IN DEXTROSE-NACL 20-5-0.9 MEQ/L-%-% IV SOLN
INTRAVENOUS | Status: DC
  Administered 2011-06-28 – 2011-06-29 (×3): via INTRAVENOUS
  Filled 2011-06-28 (×4): qty 1000

## 2011-06-28 MED ORDER — ATORVASTATIN CALCIUM 40 MG PO TABS
40.0000 mg | ORAL_TABLET | Freq: Every day | ORAL | Status: DC
Start: 2011-06-28 — End: 2011-06-29
  Administered 2011-06-28: 40 mg via ORAL
  Filled 2011-06-28 (×2): qty 1

## 2011-06-28 MED ORDER — BUPIVACAINE-EPINEPHRINE PF 0.25-1:200000 % IJ SOLN
INTRAMUSCULAR | Status: AC
  Filled 2011-06-28: qty 30

## 2011-06-28 MED ORDER — SODIUM CHLORIDE 0.9 % IV SOLN
INTRAVENOUS | Status: DC | PRN
  Administered 2011-06-28: 08:00:00

## 2011-06-28 MED ORDER — HYDROCODONE-ACETAMINOPHEN 5-325 MG PO TABS
1.0000 | ORAL_TABLET | Freq: Four times a day (QID) | ORAL | Status: DC | PRN
  Administered 2011-06-28 (×2): 1 via ORAL
  Filled 2011-06-28 (×2): qty 1

## 2011-06-28 MED ORDER — MEPERIDINE HCL 25 MG/ML IJ SOLN: 6.2500 mg | INTRAMUSCULAR | Status: DC | PRN

## 2011-06-28 MED ORDER — SODIUM CHLORIDE 0.9 % IR SOLN
Status: DC | PRN
  Administered 2011-06-28: 1000 mL

## 2011-06-28 MED ORDER — LISINOPRIL 20 MG PO TABS
30.0000 mg | ORAL_TABLET | Freq: Every day | ORAL | Status: DC
  Administered 2011-06-28 – 2011-06-29 (×2): 30 mg via ORAL
  Filled 2011-06-28 (×2): qty 1

## 2011-06-28 MED ORDER — LEVOTHYROXINE SODIUM 175 MCG PO TABS
175.0000 ug | ORAL_TABLET | Freq: Every morning | ORAL | Status: DC
  Administered 2011-06-29: 175 ug via ORAL
  Filled 2011-06-28 (×2): qty 1

## 2011-06-28 MED ORDER — ONDANSETRON HCL 4 MG/2ML IJ SOLN
INTRAMUSCULAR | Status: DC | PRN
  Administered 2011-06-28: 4 mg via INTRAVENOUS

## 2011-06-28 MED ORDER — PANTOPRAZOLE SODIUM 40 MG PO TBEC
40.0000 mg | DELAYED_RELEASE_TABLET | Freq: Every day | ORAL | Status: DC
  Administered 2011-06-28: 40 mg via ORAL
  Filled 2011-06-28: qty 1

## 2011-06-28 MED ORDER — HYDROCHLOROTHIAZIDE 25 MG PO TABS
25.0000 mg | ORAL_TABLET | Freq: Every day | ORAL | Status: DC
Start: 2011-06-28 — End: 2011-06-29
  Administered 2011-06-28 – 2011-06-29 (×2): 25 mg via ORAL
  Filled 2011-06-28 (×2): qty 1

## 2011-06-28 MED ORDER — ONDANSETRON HCL 4 MG PO TABS: 4.0000 mg | ORAL_TABLET | Freq: Four times a day (QID) | ORAL | Status: DC | PRN

## 2011-06-28 MED ORDER — GLUCAGON HCL (RDNA) 1 MG IJ SOLR
INTRAMUSCULAR | Status: DC | PRN
  Administered 2011-06-28: 1 mg via INTRAVENOUS

## 2011-06-28 MED ORDER — MORPHINE SULFATE 2 MG/ML IJ SOLN: 1.0000 mg | INTRAMUSCULAR | Status: DC | PRN

## 2011-06-28 MED ORDER — ONDANSETRON HCL 4 MG/2ML IJ SOLN: 4.0000 mg | Freq: Four times a day (QID) | INTRAMUSCULAR | Status: DC | PRN

## 2011-06-28 MED ORDER — NEOSTIGMINE METHYLSULFATE 1 MG/ML IJ SOLN
INTRAMUSCULAR | Status: DC | PRN
  Administered 2011-06-28: 4 mg via INTRAVENOUS

## 2011-06-28 MED ORDER — GLYCOPYRROLATE 0.2 MG/ML IJ SOLN
INTRAMUSCULAR | Status: DC | PRN
  Administered 2011-06-28: .6 mg via INTRAVENOUS

## 2011-06-28 MED ORDER — LIDOCAINE HCL (CARDIAC) 20 MG/ML IV SOLN
INTRAVENOUS | Status: DC | PRN
  Administered 2011-06-28: 80 mg via INTRAVENOUS

## 2011-06-28 MED ORDER — PROPOFOL 10 MG/ML IV EMUL
INTRAVENOUS | Status: DC | PRN
  Administered 2011-06-28: 170 mg via INTRAVENOUS

## 2011-06-28 MED ORDER — LACTATED RINGERS IV SOLN
INTRAVENOUS | Status: DC | PRN
  Administered 2011-06-28 (×2): via INTRAVENOUS

## 2011-06-28 MED ORDER — HYDROMORPHONE HCL PF 1 MG/ML IJ SOLN
0.2500 mg | INTRAMUSCULAR | Status: DC | PRN
  Administered 2011-06-28 (×4): 0.5 mg via INTRAVENOUS

## 2011-06-28 MED ORDER — OMEGA-3-ACID ETHYL ESTERS 1 G PO CAPS
1.0000 g | ORAL_CAPSULE | Freq: Two times a day (BID) | ORAL | Status: DC
  Administered 2011-06-28 – 2011-06-29 (×3): 1 g via ORAL
  Filled 2011-06-28 (×4): qty 1

## 2011-06-28 MED ORDER — ROCURONIUM BROMIDE 100 MG/10ML IV SOLN
INTRAVENOUS | Status: DC | PRN
  Administered 2011-06-28: 50 mg via INTRAVENOUS

## 2011-06-28 MED ORDER — HYDROMORPHONE HCL PF 1 MG/ML IJ SOLN
INTRAMUSCULAR | Status: AC
  Filled 2011-06-28: qty 1

## 2011-06-28 MED ORDER — EPHEDRINE SULFATE 50 MG/ML IJ SOLN
INTRAMUSCULAR | Status: DC | PRN
  Administered 2011-06-28: 15 mg via INTRAVENOUS

## 2011-06-28 MED ORDER — FENTANYL CITRATE 0.05 MG/ML IJ SOLN
INTRAMUSCULAR | Status: DC | PRN
  Administered 2011-06-28 (×2): 50 ug via INTRAVENOUS
  Administered 2011-06-28: 100 ug via INTRAVENOUS
  Administered 2011-06-28: 50 ug via INTRAVENOUS

## 2011-06-28 SURGICAL SUPPLY — 47 items
APPLIER CLIP 5 13 M/L LIGAMAX5 (MISCELLANEOUS) ×2
APPLIER CLIP ROT 10 11.4 M/L (STAPLE)
BLADE SURG ROTATE 9660 (MISCELLANEOUS) IMPLANT
CANISTER SUCTION 2500CC (MISCELLANEOUS) ×2 IMPLANT
CATH REDDICK CHOLANGI 4FR 50CM (CATHETERS) ×2 IMPLANT
CHLORAPREP W/TINT 26ML (MISCELLANEOUS) ×2 IMPLANT
CLIP APPLIE 5 13 M/L LIGAMAX5 (MISCELLANEOUS) ×1 IMPLANT
CLIP APPLIE ROT 10 11.4 M/L (STAPLE) IMPLANT
CLOTH BEACON ORANGE TIMEOUT ST (SAFETY) ×2 IMPLANT
COVER MAYO STAND STRL (DRAPES) ×2 IMPLANT
COVER SURGICAL LIGHT HANDLE (MISCELLANEOUS) ×2 IMPLANT
DECANTER SPIKE VIAL GLASS SM (MISCELLANEOUS) IMPLANT
DERMABOND ADVANCED (GAUZE/BANDAGES/DRESSINGS) ×1
DERMABOND ADVANCED .7 DNX12 (GAUZE/BANDAGES/DRESSINGS) ×1 IMPLANT
DRAPE C-ARM 42X72 X-RAY (DRAPES) ×2 IMPLANT
DRAPE UTILITY 15X26 W/TAPE STR (DRAPE) ×4 IMPLANT
ELECT REM PT RETURN 9FT ADLT (ELECTROSURGICAL) ×2
ELECTRODE REM PT RTRN 9FT ADLT (ELECTROSURGICAL) ×1 IMPLANT
GLOVE BIO SURGEON STRL SZ 6 (GLOVE) ×2 IMPLANT
GLOVE BIO SURGEON STRL SZ7 (GLOVE) ×2 IMPLANT
GLOVE BIO SURGEON STRL SZ7.5 (GLOVE) ×4 IMPLANT
GLOVE BIOGEL PI IND STRL 6.5 (GLOVE) ×1 IMPLANT
GLOVE BIOGEL PI IND STRL 7.0 (GLOVE) ×1 IMPLANT
GLOVE BIOGEL PI IND STRL 7.5 (GLOVE) ×2 IMPLANT
GLOVE BIOGEL PI INDICATOR 6.5 (GLOVE) ×1
GLOVE BIOGEL PI INDICATOR 7.0 (GLOVE) ×1
GLOVE BIOGEL PI INDICATOR 7.5 (GLOVE) ×2
GLOVE SURG SS PI 7.5 STRL IVOR (GLOVE) ×2 IMPLANT
GOWN STRL NON-REIN LRG LVL3 (GOWN DISPOSABLE) ×8 IMPLANT
GOWN STRL REIN 3XL LVL4 (GOWN DISPOSABLE) ×2 IMPLANT
IV CATH 14GX2 1/4 (CATHETERS) ×2 IMPLANT
KIT BASIN OR (CUSTOM PROCEDURE TRAY) ×2 IMPLANT
KIT ROOM TURNOVER OR (KITS) ×2 IMPLANT
NS IRRIG 1000ML POUR BTL (IV SOLUTION) ×2 IMPLANT
PAD ARMBOARD 7.5X6 YLW CONV (MISCELLANEOUS) ×2 IMPLANT
POUCH SPECIMEN RETRIEVAL 10MM (ENDOMECHANICALS) ×2 IMPLANT
SCISSORS LAP 5X35 DISP (ENDOMECHANICALS) IMPLANT
SET IRRIG TUBING LAPAROSCOPIC (IRRIGATION / IRRIGATOR) ×2 IMPLANT
SLEEVE ENDOPATH XCEL 5M (ENDOMECHANICALS) ×4 IMPLANT
SPECIMEN JAR SMALL (MISCELLANEOUS) ×2 IMPLANT
SUT MNCRL AB 4-0 PS2 18 (SUTURE) ×2 IMPLANT
TOWEL OR 17X24 6PK STRL BLUE (TOWEL DISPOSABLE) ×2 IMPLANT
TOWEL OR 17X26 10 PK STRL BLUE (TOWEL DISPOSABLE) ×2 IMPLANT
TRAY LAPAROSCOPIC (CUSTOM PROCEDURE TRAY) ×2 IMPLANT
TROCAR XCEL BLUNT TIP 100MML (ENDOMECHANICALS) ×2 IMPLANT
TROCAR XCEL NON-BLD 11X100MML (ENDOMECHANICALS) IMPLANT
TROCAR XCEL NON-BLD 5MMX100MML (ENDOMECHANICALS) ×2 IMPLANT

## 2011-06-28 NOTE — Op Note (Signed)
06/28/2011  8:50 AM  PATIENT:  E[name]  78 y.o. female  PRE-OPERATIVE DIAGNOSIS:  gallstones  [name]-OPERATIVE DIAGNOSIS:  gallstones  PROCEDURE:  Procedure(s) (LRB): LAPAROSCOPIC CHOLECYSTECTOMY WITH INTRAOPERATIVE CHOLANGIOGRAM (N/A)  SURGEON:  Surgeon(s) and Role:    * Robyne Askew, MD - Primary  PHYSICIAN ASSISTANT:   ASSISTANTS: Dr. Donell Beers   ANESTHESIA:   general  EBL:  Total I/O In: 1000 [I.V.:1000] Out: -   BLOOD ADMINISTERED:none  DRAINS: none   LOCAL MEDICATIONS USED:  MARCAINE     SPECIMEN:  Source of Specimen:  gallbladder  DISPOSITION OF SPECIMEN:  PATHOLOGY  COUNTS:  YES  TOURNIQUET:  * No tourniquets in log *  DICTATION: .Dragon Dictation @opnoteheader @  Procedure: After informed consent was obtained the patient was brought to the operating room and placed in the supine position on the operating room table. After adequate induction of general anesthesia the patient's abdomen was prepped with ChloraPrep allowed to dry and draped in usual sterile manner. The area below the umbilicus was infiltrated with quarter percent  Marcaine. A small incision was made with a 15 blade knife. The incision was carried down through the subcutaneous tissue bluntly with a hemostat and [employer] retractors. The linea alba was identified. The linea alba was incised with a 15 blade knife and each side was grasped with Coker clamps. The preperitoneal space was then probed with a hemostat until the peritoneum was opened and access was gained to the abdominal cavity. A 0 Vicryl pursestring stitch was placed in the fascia surrounding the opening. A Hassan cannula was then placed through the opening and anchored in place with the previously placed Vicryl purse string stitch. The abdomen was insufflated with carbon dioxide without difficulty. A laparoscope was inserted through the [facility] cannula in the right upper quadrant was inspected. Next the epigastric region was infiltrated  with % Marcaine. A small incision was made with a 15 blade knife. A 5 mm port was placed bluntly through this incision into the abdominal cavity under direct vision. Next 2 sites were chosen laterally on the right side of the abdomen for placement of 5 mm ports. Each of these areas was infiltrated with quarter percent Marcaine. Small stab incisions were made with a 15 blade knife. 5 mm ports were then placed bluntly through these incisions into the abdominal cavity under direct vision without difficulty. A blunt grasper was placed through the lateralmost 5 mm port and used to grasp the dome of the gallbladder and elevated anteriorly and superiorly. Another blunt grasper was placed through the other 5 mm port and used to retract the body and neck of the gallbladder. A dissector was placed through the epigastric port and using the electrocautery the peritoneal reflection at the gallbladder neck was opened. Blunt dissection was then carried out in this area until the gallbladder neck-cystic duct junction was readily identified and a good window was created. A single clip was placed on the gallbladder neck. A small  ductotomy was made just below the clip with laparoscopic scissors. A 14-gauge Angiocath was then placed through the anterior abdominal wall under direct vision. A Reddick cholangiogram catheter was then placed through the Angiocath and flushed. The catheter was then placed in the cystic duct and anchored in place with a clip. A cholangiogram was obtained that showed a filling defect, no emptying into the duodenum, and adequate length on the cystic duct. The reddick catheter was then passed into the duodenum, the balloon was inflated and  withdrawn. No stones were seen. Another cholangiogram was obtained with small amount of emptying into duodenum. It was not clear if the stone passed. The anchoring clip and catheters were then removed from the patient. 3 clips were placed proximally on the cystic duct and  the duct was divided between the 2 sets of clips. Posterior to this the cystic artery was identified and again dissected bluntly in a circumferential manner until a good window  was created. 2 clips were placed proximally and one distally on the artery and the artery was divided between the 2 sets of clips. Next a laparoscopic hook cautery device was used to separate the gallbladder from the liver bed. Prior to completely detaching the gallbladder from the liver bed the liver bed was inspected and several small bleeding points were coagulated with the electrocautery until the area was completely hemostatic. The gallbladder was then detached the rest of it from the liver bed without difficulty. A laparoscopic bag was inserted through the hassan canula. The gallbladder was placed within the bag and the bag was sealed. A laparoscope was then moved to the epigastric port. The gallbladder grasper was placed through the [facility] cannula and used to grasp the opening of the bag. The bag with the gallbladder was then removed with the [facility] cannula through the infraumbilical port without difficulty. The fascial defect was then closed with the previously placed Vicryl pursestring stitch as well as with another figure-of-eight 0 Vicryl stitch. The liver bed was inspected again and found to be hemostatic. The abdomen was irrigated with copious amounts of saline until the effluent was clear. The ports were then removed under direct vision without difficulty and were found to be hemostatic. The gas was allowed to escape. The skin incisions were all closed with interrupted 4-0 Monocryl subcuticular stitches. Dermabond dressings were applied. The patient tolerated the procedure well. At the end of the case all needle sponge and instrument counts were correct. The patient was then awakened and taken to recovery in stable condition   PLAN OF CARE: Admit for overnight observation  PATIENT DISPOSITION:  PACU - hemodynamically  stable.   Delay start of Pharmacological VTE agent (>24hrs) due to surgical blood loss or risk of bleeding: yes

## 2011-06-28 NOTE — OR Nursing (Signed)
Pt. Voided 300 cc's using bedpan.

## 2011-06-28 NOTE — Anesthesia Preprocedure Evaluation (Addendum)
Anesthesia Evaluation  Patient identified by MRN, date of birth, ID band Patient awake    Reviewed: Allergy & Precautions, H&P , NPO status , Patient's Chart, lab work & pertinent test results  Airway Mallampati: II  Neck ROM: Full    Dental  (+) Teeth Intact and Dental Advisory Given   Pulmonary sleep apnea ,  breath sounds clear to auscultation        Cardiovascular hypertension, Pt. on medications + dysrhythmias Atrial Fibrillation Rhythm:Irregular Rate:Normal + Systolic murmurs    Neuro/Psych TIA   GI/Hepatic hiatal hernia, GERD-  Medicated and Controlled,  Endo/Other  Hypothyroidism   Renal/GU      Musculoskeletal   Abdominal (+) + obese,   Peds  Hematology   Anesthesia Other Findings   Reproductive/Obstetrics                          Anesthesia Physical Anesthesia Plan  ASA: III  Anesthesia Plan: General   Post-op Pain Management:    Induction: Intravenous  Airway Management Planned: Oral ETT  Additional Equipment:   Intra-op Plan:   Post-operative Plan: Extubation in OR  Informed Consent: I have reviewed the patients History and Physical, chart, labs and discussed the procedure including the risks, benefits and alternatives for the proposed anesthesia with the patient or authorized representative who has indicated his/her understanding and acceptance.   Dental advisory given  Plan Discussed with: CRNA and Surgeon  Anesthesia Plan Comments:         Anesthesia Quick Evaluation

## 2011-06-28 NOTE — Preoperative (Signed)
Beta Blockers   Reason not to administer Beta Blockers:Not Applicable 

## 2011-06-28 NOTE — Transfer of Care (Signed)
Immediate Anesthesia Transfer of Care Note  Patient: Erika [name]  Procedure(s) Performed: Procedure(s) (LRB): LAPAROSCOPIC CHOLECYSTECTOMY WITH INTRAOPERATIVE CHOLANGIOGRAM (N/A)  Patient Location: PACU  Anesthesia Type: General  Level of Consciousness: awake, alert , oriented and patient cooperative  Airway & Oxygen Therapy: Patient Spontanous Breathing and Patient connected to face mask oxygen  Post-op Assessment: Report given to PACU RN  Post vital signs: Reviewed  Complications: No apparent anesthesia complications

## 2011-06-28 NOTE — H&P (View-Only) (Signed)
Subjective:     Patient ID: Erika [name], female   DOB: 03-04-1932, 78 y.o.   MRN: [id number]  HPI The patient is a 78 year old white female who's been experiencing right upper quadrant and epigastric pain for the last 6 weeks. She hurt pretty much every day. The pain seems to be brought almost eating. She has significant nausea with it but no vomiting. She does state that the pain radiates into her back. She had an ultrasound done to evaluate this pain and it revealed stones in her gallbladder and some mild gallbladder wall thickening but no ductal dilatation.  Review of Systems  Constitutional: Negative.   HENT: Negative.   Eyes: Negative.   Respiratory: Negative.   Cardiovascular: Negative.   Gastrointestinal: Positive for nausea, abdominal pain and abdominal distention.  Genitourinary: Negative.   Musculoskeletal: Negative.   Skin: Negative.   Neurological: Negative.   Hematological: Negative.   Psychiatric/Behavioral: Negative.        Objective:   Physical Exam  Constitutional: She is oriented to person, place, and time. She appears well-developed and well-nourished.  HENT:  Head: Normocephalic and atraumatic.  Eyes: Conjunctivae and EOM are normal. Pupils are equal, round, and reactive to light.  Neck: Normal range of motion. Neck supple.  Cardiovascular:  Murmur heard.      Irregular rate and rhythm  Pulmonary/Chest: Effort normal and breath sounds normal.  Abdominal: Soft. Bowel sounds are normal.       Mild to moderate right upper quadrant and epigastric tenderness. No palpable mass.  Musculoskeletal: Normal range of motion.  Neurological: She is alert and oriented to person, place, and time.  Skin: Skin is warm and dry.  Psychiatric: She has a normal mood and affect. Her behavior is normal.       Assessment:     The patient has what sounds like symptom gallstones. Because of the risk of further painful episodes and possible pancreatitis I think she would benefit  from having her gallbladder removed. She would also like to have this done. I have discussed with her in detail the risks and benefits of the operation to remove the gallbladder as well as some of the technical aspects and she understands and wishes to proceed.    Plan:     Plan for laparoscopic cholecystectomy with intraoperative cholangiogram.

## 2011-06-28 NOTE — Progress Notes (Signed)
Admitted to room 5158. A/Ox4. Denies nausea/pain at this. Oriented to room and surroundings. Husband at bedside.

## 2011-06-28 NOTE — Interval H&P Note (Signed)
History and Physical Interval Note:  06/28/2011 6:29 AM  Erika [name]  has presented today for surgery, with the diagnosis of gallstones  The various methods of treatment have been discussed with the patient and family. After consideration of risks, benefits and other options for treatment, the patient has consented to  Procedure(s) (LRB): LAPAROSCOPIC CHOLECYSTECTOMY WITH INTRAOPERATIVE CHOLANGIOGRAM (N/A) as a surgical intervention .  The patient's history has been reviewed, patient examined, no change in status, stable for surgery.  I have reviewed the patients' chart and labs.  Questions were answered to the patient's satisfaction.     TOTH III,Lachlyn Vanderstelt S

## 2011-06-28 NOTE — Anesthesia Postprocedure Evaluation (Signed)
  Anesthesia Post-op Note  Patient: Erika [name]  Procedure(s) Performed: Procedure(s) (LRB): LAPAROSCOPIC CHOLECYSTECTOMY WITH INTRAOPERATIVE CHOLANGIOGRAM (N/A)  Patient Location: PACU  Anesthesia Type: General  Level of Consciousness: awake  Airway and Oxygen Therapy: Patient Spontanous Breathing  Post-op Pain: mild  Post-op Assessment: Post-op Vital signs reviewed  Post-op Vital Signs: stable  Complications: No apparent anesthesia complications and adverse drug reaction

## 2011-06-29 ENCOUNTER — Encounter (HOSPITAL_COMMUNITY): Payer: Self-pay | Admitting: General Surgery

## 2011-06-29 LAB — CBC
MCH: 28.6 pg (ref 26.0–34.0)
MCHC: 34 g/dL (ref 30.0–36.0)
MCV: 84.1 fL (ref 78.0–100.0)
Platelets: 239 10*3/uL (ref 150–400)
RDW: 14.1 % (ref 11.5–15.5)

## 2011-06-29 LAB — COMPREHENSIVE METABOLIC PANEL
AST: 33 U/L (ref 0–37)
Albumin: 3.2 g/dL — ABNORMAL LOW (ref 3.5–5.2)
Alkaline Phosphatase: 128 U/L — ABNORMAL HIGH (ref 39–117)
Chloride: 95 mEq/L — ABNORMAL LOW (ref 96–112)
Potassium: 4.3 mEq/L (ref 3.5–5.1)
Total Bilirubin: 0.3 mg/dL (ref 0.3–1.2)

## 2011-06-29 MED ORDER — HYDROCODONE-ACETAMINOPHEN 5-325 MG PO TABS: 1.0000 | ORAL_TABLET | Freq: Four times a day (QID) | ORAL | Status: AC | PRN

## 2011-06-29 NOTE — Discharge Summary (Signed)
Physician Discharge Summary  Patient ID: Erika [name] MRN: [id number] DOB/AGE: Nov 21, 1932 78 y.o.  Admit date: 06/28/2011 Discharge date: 06/29/2011  Admission Diagnoses:gallstones   Discharge Diagnoses: gallstonesActive Problems:  * No active hospital problems. *    Discharged Condition: good  Hospital Course: the pt underwent lap chole with ioc. She had a CBD stone that i think we were able to clear. She stayed overnight to we could check her lft's which were normal today. She is ready for d/c  Consults: None  Significant Diagnostic Studies: none  Treatments: surgery: lap chole with ioc  Discharge Exam: Blood pressure 141/44, pulse 61, temperature 98.2 F (36.8 C), temperature source Oral, resp. rate 16, height 5\' 7"  (1.702 m), weight 210 lb (95.255 kg), SpO2 98.00%. GI: soft, nontender  Disposition: 01-Home or Self Care  Discharge Orders    Future Appointments: Provider: Department: Dept Phone: Center:   07/19/2011 9:40 AM Caleen Essex III, MD [facility] [phone] None     Medication List  As of 06/29/2011  9:50 AM   ASK your doctor about these medications         aspirin 325 MG tablet   Take 1 tablet (325 mg total) by mouth daily.      atorvastatin 40 MG tablet   Commonly known as: LIPITOR   Take 1 tablet (40 mg total) by mouth daily at 6 PM.      desloratadine 5 MG tablet   Commonly known as: CLARINEX   Take 5 mg by mouth at bedtime.      ergocalciferol 50000 UNITS capsule   Commonly known as: VITAMIN D2   Take 50,000 Units by mouth once a week. Take on thursday      hydrochlorothiazide 25 MG tablet   Commonly known as: HYDRODIURIL   Take 25 mg by mouth daily.      HYDROcodone-acetaminophen 5-325 MG per tablet   Commonly known as: NORCO   Take 1 tablet by mouth every 6 (six) hours as needed. For pain      levothyroxine 175 MCG tablet   Commonly known as: SYNTHROID, LEVOTHROID   Take 175 mcg by mouth every morning.      lisinopril 10 MG tablet    Commonly known as: PRINIVIL,ZESTRIL   Take 30 mg by mouth daily.      omega-3 acid ethyl esters 1 G capsule   Commonly known as: LOVAZA   Take 1 g by mouth 2 (two) times daily.      omeprazole 20 MG capsule   Commonly known as: PRILOSEC   Take 20 mg by mouth 2 (two) times daily.      ondansetron 8 MG disintegrating tablet   Commonly known as: ZOFRAN-ODT   Take 8 mg by mouth daily as needed. For nausea      potassium chloride 10 MEQ tablet   Commonly known as: K-DUR   Take 10 mEq by mouth daily.      zolpidem 10 MG tablet   Commonly known as: AMBIEN   Take 10 mg by mouth at bedtime as needed. For sleep           Follow-up Information    Follow up with TOTH III,Evalin Shawhan S, MD in 3 weeks.   Contact information:   [employer] [address] [name] [address] [phone]          Signed: Robyne Askew 06/29/2011, 9:50 AM

## 2011-06-29 NOTE — Progress Notes (Signed)
1 Day Post-Op  Subjective: No complaints. Feels great  Objective: Vital signs in last 24 hours: Temp:  [97.4 F (36.3 C)-98.2 F (36.8 C)] 98.2 F (36.8 C) (06/21 0610) Pulse Rate:  [60-73] 61  (06/21 0610) Resp:  [10-18] 16  (06/21 0610) BP: (141-176)/(44-68) 141/44 mmHg (06/21 0610) SpO2:  [95 %-100 %] 98 % (06/21 0610) Weight:  [210 lb (95.255 kg)] 210 lb (95.255 kg) (06/21 0145) Last BM Date: 06/27/11  Intake/Output from previous day: 06/20 0701 - 06/21 0700 In: 2885 [I.V.:2885] Out: 3075 [Urine:3050; Blood:25] Intake/Output this shift: Total I/O In: -  Out: 400 [Urine:400]  GI: soft, nontender, incisions look good  Lab Results:   Basename 06/29/11 0545  WBC 14.4*  HGB 9.9*  HCT 29.1*  PLT 239   BMET  Basename 06/29/11 0545  NA 128*  K 4.3  CL 95*  CO2 24  GLUCOSE 139*  BUN 8  CREATININE 0.79  CALCIUM 9.1   PT/INR No results found for this basename: LABPROT:2,INR:2 in the last 72 hours ABG No results found for this basename: PHART:2,PCO2:2,PO2:2,HCO3:2 in the last 72 hours  Studies/Results: Dg Cholangiogram Operative  06/29/2011  *RADIOLOGY REPORT*  Clinical Data:   Cholelithiasis.  INTRAOPERATIVE CHOLANGIOGRAM  Technique:  Cholangiographic images from the C-arm fluoroscopic device were submitted for interpretation post-operatively.  Please see the procedural report for the amount of contrast and the fluoroscopy time utilized.  Comparison:  Ultrasound dated 05/06/2011  Findings:  The common hepatic and common bile duct are normal. Visualized intrahepatic ducts are normal. Contrast flows into the duodenum.  Small air bubble is seen in the distal common bile duct.  IMPRESSION: Normal intraoperative cholangiogram.  Original Report Authenticated By: Gwynn Burly, M.D.    Anti-infectives: Anti-infectives     Start     Dose/Rate Route Frequency Ordered Stop   06/27/11 1411   ceFAZolin (ANCEF) IVPB 2 g/50 mL premix        2 g 100 mL/hr over 30 Minutes  Intravenous 60 min pre-op 06/27/11 1411 06/28/11 0736          Assessment/Plan: s/p Procedure(s) (LRB): LAPAROSCOPIC CHOLECYSTECTOMY WITH INTRAOPERATIVE CHOLANGIOGRAM (N/A) Advance diet Discharge  LOS: 1 day    TOTH III,Paislei Dorval S 06/29/2011

## 2011-06-29 NOTE — Progress Notes (Signed)
Discharged patient to home with instructions and prescriptions, verbalized understanding, escorted by husband.

## 2011-07-09 ENCOUNTER — Telehealth (INDEPENDENT_AMBULATORY_CARE_PROVIDER_SITE_OTHER): Payer: Self-pay | Admitting: General Surgery

## 2011-07-09 NOTE — Telephone Encounter (Signed)
The patient contacted the office c/o sporadic epigastric pain and fatigue. She denies fever, constipation, wound drainage and is not taking her pain medication. Explained to her that after surgery it is normal to have discomfort and fatigue. She does not like to take pain medication because of constipation, I expressed she could take Mirlax to help prevent this. She was also instructed on using tylenol or motrin, the patient verbalized understanding and stated she would call if symptoms did not resolve.

## 2011-07-11 IMAGING — CR DG CHEST 1V PORT
1 series · 1 of 1 positions shown · non-contrast
Comparison: 07/19/2009.

CLINICAL DATA: Sore throat.  ICU.

PORTABLE CHEST - 1 VIEW

[AP]
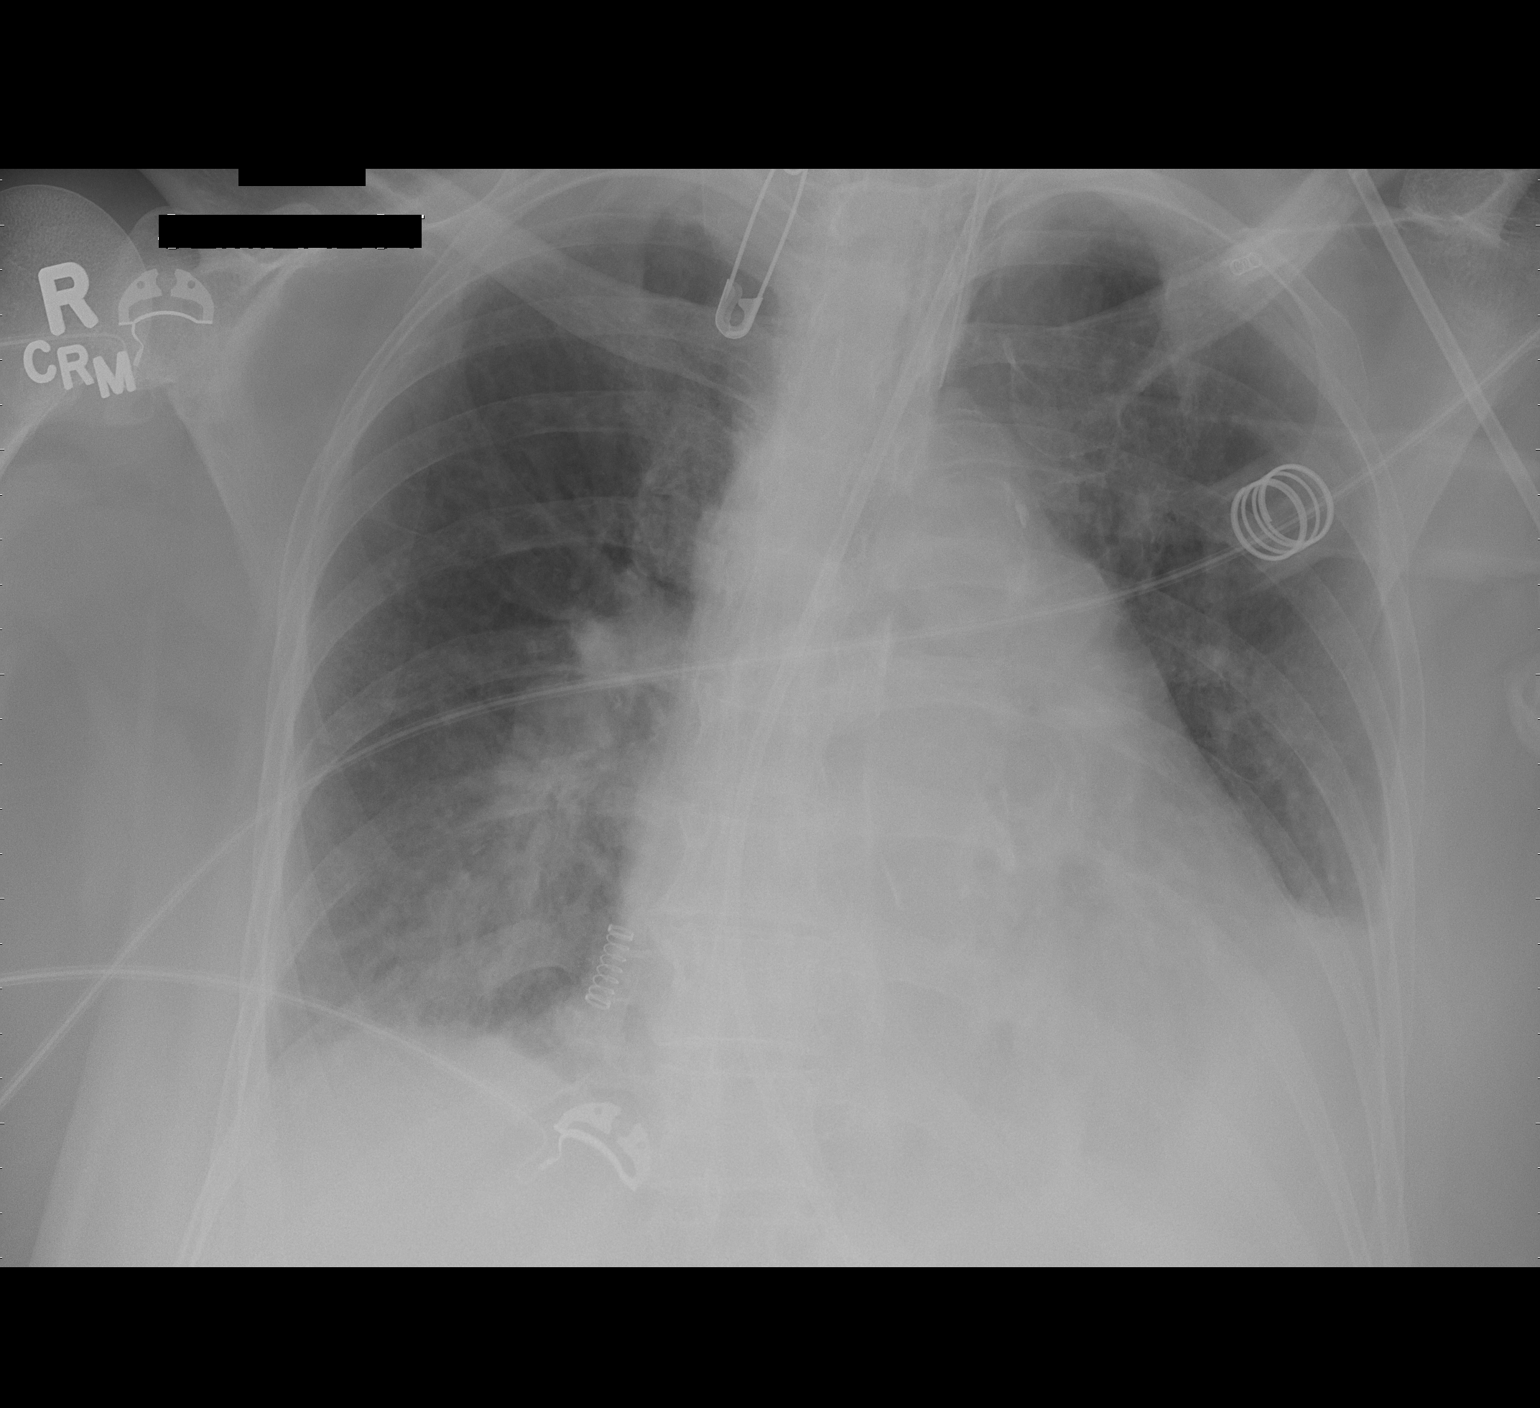

[1 of 1 positions shown; findings below may reference images not displayed]

FINDINGS: The patient remains intubated.  The endotracheal tube has
been pulled back and now terminates at the level of T1.  A small
bore feeding tube courses off the inferior border of the film.  The
cardiopericardial silhouette remains enlarged.  A left pleural
effusion and left basilar airspace disease is stable.  Pulmonary
vascular congestion versus mild edema is also stable.
IMPRESSION: 1.  Stable cardiomegaly, bilateral pleural effusions and basilar
airspace disease, left greater than right.  This continues to be
concerning for mild congestive heart failure.
2.  Stable pulmonary vascular congestion versus mild edema.
3.  The endotracheal tube has been pulled back and now terminates
at the level of the T1.

## 2011-07-15 IMAGING — RF DG NASO G TUBE PLC W/FL W/RAD
2 series · 2 of 2 positions shown · non-contrast
Comparison: 07/19/2009

CLINICAL DATA: Panda tube placement under fluoroscopic guidance.
Patient has pharyngeal abscess.

NG/OG TUBE BY FERIENHAUS

[Series 1: run · 1 of 1 slices shown (1 of 2)]
[im 1/1]
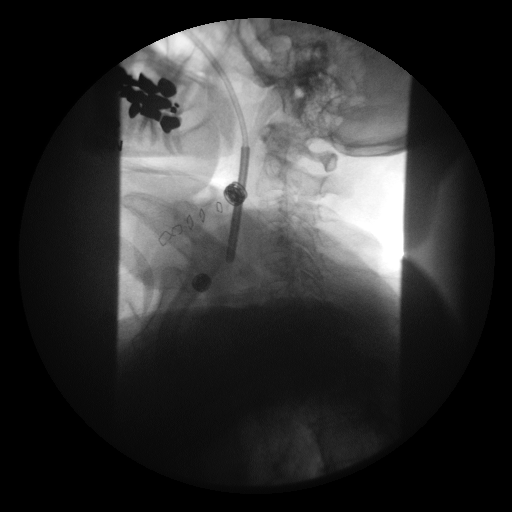

[Series 2: run · 1 of 1 slices shown (2 of 2)]
[im 1/1]
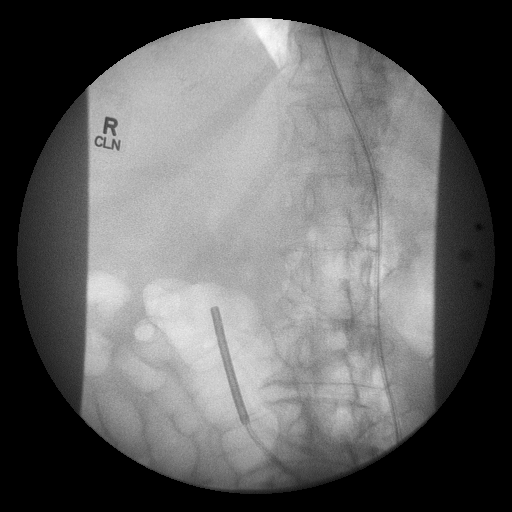

[2 of 2 positions shown; findings below may reference images not displayed]

FINDINGS: Feeding tube is placed under fluoroscopic guidance.  Tube
is placed to the distal stomach and is ready for use.

Fluoro time:  4 minutes 2 seconds.
IMPRESSION: Fluoroscopic guidance used for placement of feeding tube to the
distal stomach.

## 2011-07-15 IMAGING — CR DG CHEST 1V PORT
1 series · 1 of 1 positions shown · non-contrast
Comparison: 07/21/2009 and earlier.

CLINICAL DATA: 77-year-old female with chest pain and weakness.

PORTABLE CHEST - 1 VIEW

[AP]
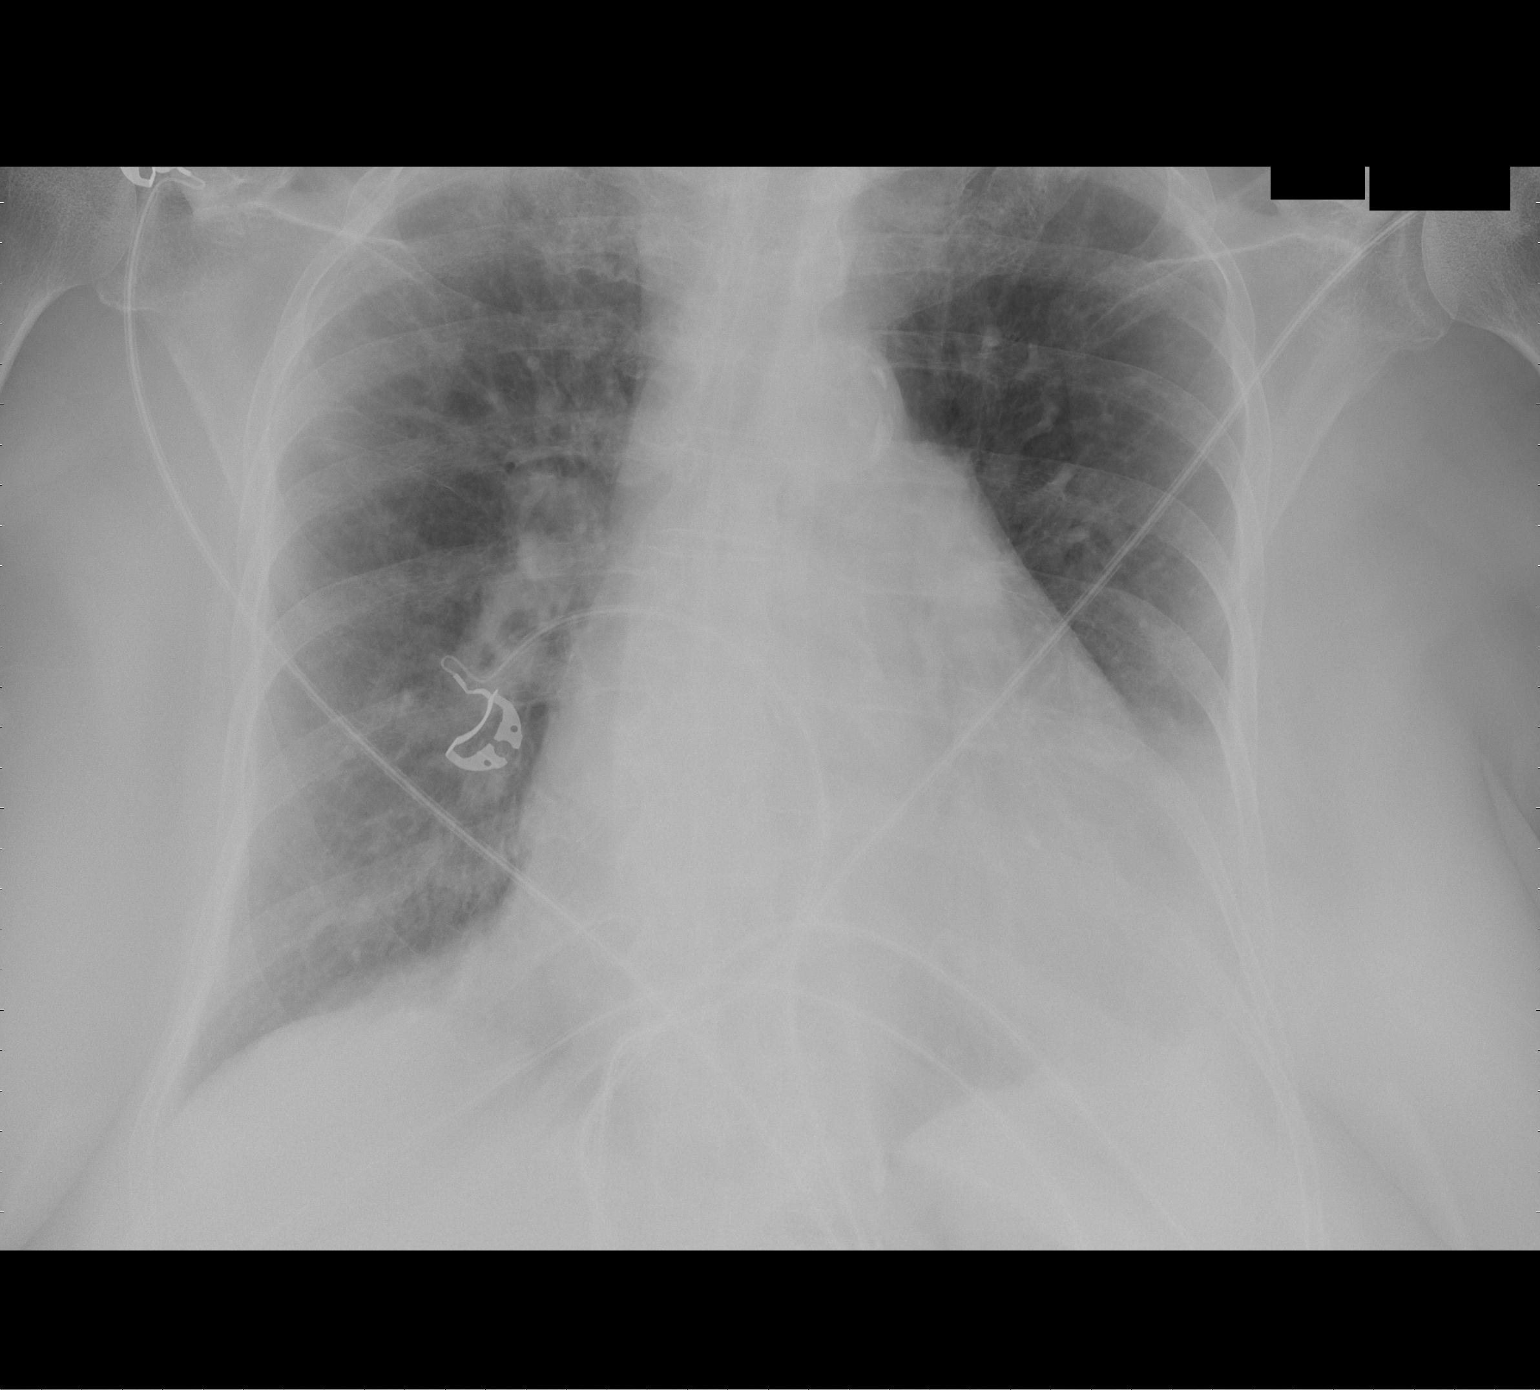

[1 of 1 positions shown; findings below may reference images not displayed]

FINDINGS: AP portable semi upright view 1717 hours.  Enteric
feeding tube has been removed. Stable cardiomegaly and mediastinal
contours.  Some improved ventilation at the left lung base with
residual blunting of the costophrenic sulcus.  No pneumothorax.
Decrease pulmonary vascular congestion.  No pulmonary edema.
IMPRESSION: 1.  Enteric feeding tube removed.
2.  Chronic left lung base blunting, interval improved
retrocardiac ventilation.

## 2011-07-16 ENCOUNTER — Encounter (INDEPENDENT_AMBULATORY_CARE_PROVIDER_SITE_OTHER): Payer: Self-pay | Admitting: General Surgery

## 2011-07-19 ENCOUNTER — Encounter (INDEPENDENT_AMBULATORY_CARE_PROVIDER_SITE_OTHER): Payer: Self-pay | Admitting: General Surgery

## 2011-07-19 ENCOUNTER — Ambulatory Visit (INDEPENDENT_AMBULATORY_CARE_PROVIDER_SITE_OTHER): Payer: Medicare Other | Admitting: General Surgery

## 2011-07-19 VITALS — BP 118/68 | HR 80 | Temp 98.1°F | Resp 18 | Ht 67.0 in | Wt 207.0 lb

## 2011-07-19 DIAGNOSIS — K8020 Calculus of gallbladder without cholecystitis without obstruction: Secondary | ICD-10-CM

## 2011-07-19 MED ORDER — HYDROCODONE-ACETAMINOPHEN 5-325 MG PO TABS: 1.0000 | ORAL_TABLET | Freq: Four times a day (QID) | ORAL | Status: AC | PRN

## 2011-07-19 NOTE — Progress Notes (Signed)
Subjective:     Patient ID: Erika [name], female   DOB: April 15, 1932, 78 y.o.   MRN: [id number]  HPI The patient is a 78 year old white female who is about 3 weeks out from a laparoscopic cholecystectomy. It was not clear whether she may have had a retained stone passed. Her main complaint currently is of just feeling tired and weak. She does note some intermittent epigastric pain but this tends to come and go. It seems to be very controllable with her pain medicine. She denies any nausea or vomiting. She does note that the right flank pain she was having his gone now  Review of Systems     Objective:   Physical Exam On exam her abdomen is soft. She has minimal tenderness in the epigastric area. No right upper quadrant tenderness. Her incisions are all healing nicely.    Assessment:     3 weeks status post laparoscopic cholecystectomy    Plan:     After examining her abdomen I get the feeling that she has a retained stone. She does seem to be slowly improving. We will plan to see her back in another month. If she does not feel better by then and we will refer her to the gastroenterologist to see if she needs an ERCP.

## 2011-08-30 ENCOUNTER — Encounter (INDEPENDENT_AMBULATORY_CARE_PROVIDER_SITE_OTHER): Payer: Self-pay | Admitting: General Surgery

## 2011-08-30 ENCOUNTER — Ambulatory Visit (INDEPENDENT_AMBULATORY_CARE_PROVIDER_SITE_OTHER): Payer: Medicare Other | Admitting: General Surgery

## 2011-08-30 VITALS — BP 138/74 | HR 76 | Temp 98.2°F | Resp 16 | Ht 67.0 in | Wt 208.8 lb

## 2011-08-30 DIAGNOSIS — K8020 Calculus of gallbladder without cholecystitis without obstruction: Secondary | ICD-10-CM

## 2011-08-30 NOTE — Progress Notes (Signed)
Subjective:     Patient ID: Erika [name], female   DOB: 1932-09-27, 78 y.o.   MRN: [id number]  HPI The patient is a 78 year old white female who is a little over a month and a half out from a laparoscopic cholecystectomy. She had poor emptying into the duodenum but we could not definitely identify a stone. She has continued to have some intermittent problems. She continues to feel fatigue. She'll occasionally have some right flank pain. She denies any nausea or vomiting.  Review of Systems     Objective:   Physical Exam On exam her abdomen is soft and nontender. Her incisions are healing nicely.    Assessment:     At this point I would expect her to feel a little bit better than she does. Because of her cholangiogram finding I think she ought to be evaluated by a gastroenterologist to see if she needs an ERCP study    Plan:     We will make a referral to gastroenterology and plan to see her back in about a month

## 2011-10-04 ENCOUNTER — Ambulatory Visit (INDEPENDENT_AMBULATORY_CARE_PROVIDER_SITE_OTHER): Payer: Medicare Other | Admitting: General Surgery

## 2011-10-04 ENCOUNTER — Encounter (INDEPENDENT_AMBULATORY_CARE_PROVIDER_SITE_OTHER): Payer: Self-pay | Admitting: General Surgery

## 2011-10-04 VITALS — BP 142/68 | HR 72 | Temp 96.2°F | Resp 20 | Ht 67.0 in | Wt 206.8 lb

## 2011-10-04 DIAGNOSIS — K8020 Calculus of gallbladder without cholecystitis without obstruction: Secondary | ICD-10-CM

## 2011-10-04 NOTE — Patient Instructions (Signed)
May return to all normal activities 

## 2011-10-04 NOTE — Progress Notes (Signed)
Subjective:     Patient ID: Erika [name], female   DOB: August 13, 1932, 78 y.o.   MRN: [id number]  HPI The patient is a 78 year old white female who is 3 months out from a laparoscopic cholecystectomy. She continues to have some intermittent bloating and upper, middle discomfort. She denies any nausea or vomiting. She denies any diarrhea. Her cholangiogram was questionable for a retained common duct stone. She did see the gastroenterologist who put her on cholestyramine. This is caused her to have constipation. She has not had an ERCP study.  Review of Systems     Objective:   Physical Exam On exam her abdomen is soft and nontender. Her incisions are all healing nicely with no sign of infection.    Assessment:     3 months status post laparoscopic cholecystectomy    Plan:     At this point I think she is doing well from a surgical standpoint. She may return to all her normal activities without any restrictions. She will be following up with the gastroenterologist to possibly consider an ERCP. We will plan to see her back on a when necessary basis.

## 2012-01-01 ENCOUNTER — Emergency Department (HOSPITAL_COMMUNITY): Payer: Medicare Other

## 2012-01-01 ENCOUNTER — Emergency Department (HOSPITAL_COMMUNITY)
Admission: EM | Admit: 2012-01-01 | Discharge: 2012-01-02 | Disposition: A | Discharge status: Home / Self Care | Payer: Medicare Other | Attending: Emergency Medicine | Admitting: Emergency Medicine

## 2012-01-01 DIAGNOSIS — E871 Hypo-osmolality and hyponatremia: Secondary | ICD-10-CM

## 2012-01-01 DIAGNOSIS — Z8719 Personal history of other diseases of the digestive system: Secondary | ICD-10-CM | POA: Insufficient documentation

## 2012-01-01 DIAGNOSIS — Z8679 Personal history of other diseases of the circulatory system: Secondary | ICD-10-CM | POA: Insufficient documentation

## 2012-01-01 DIAGNOSIS — H538 Other visual disturbances: Secondary | ICD-10-CM | POA: Insufficient documentation

## 2012-01-01 DIAGNOSIS — Z8739 Personal history of other diseases of the musculoskeletal system and connective tissue: Secondary | ICD-10-CM | POA: Insufficient documentation

## 2012-01-01 DIAGNOSIS — Z79899 Other long term (current) drug therapy: Secondary | ICD-10-CM | POA: Insufficient documentation

## 2012-01-01 DIAGNOSIS — E039 Hypothyroidism, unspecified: Secondary | ICD-10-CM | POA: Insufficient documentation

## 2012-01-01 DIAGNOSIS — R55 Syncope and collapse: Secondary | ICD-10-CM

## 2012-01-01 DIAGNOSIS — D649 Anemia, unspecified: Secondary | ICD-10-CM

## 2012-01-01 DIAGNOSIS — Z9109 Other allergy status, other than to drugs and biological substances: Secondary | ICD-10-CM | POA: Insufficient documentation

## 2012-01-01 DIAGNOSIS — Z862 Personal history of diseases of the blood and blood-forming organs and certain disorders involving the immune mechanism: Secondary | ICD-10-CM | POA: Insufficient documentation

## 2012-01-01 DIAGNOSIS — Z8639 Personal history of other endocrine, nutritional and metabolic disease: Secondary | ICD-10-CM | POA: Insufficient documentation

## 2012-01-01 DIAGNOSIS — I4891 Unspecified atrial fibrillation: Secondary | ICD-10-CM | POA: Insufficient documentation

## 2012-01-01 DIAGNOSIS — E785 Hyperlipidemia, unspecified: Secondary | ICD-10-CM | POA: Insufficient documentation

## 2012-01-01 DIAGNOSIS — I10 Essential (primary) hypertension: Secondary | ICD-10-CM | POA: Insufficient documentation

## 2012-01-01 DIAGNOSIS — R42 Dizziness and giddiness: Secondary | ICD-10-CM

## 2012-01-01 LAB — CBC WITH DIFFERENTIAL/PLATELET
HCT: 34.8 % — ABNORMAL LOW (ref 36.0–46.0)
Hemoglobin: 11.8 g/dL — ABNORMAL LOW (ref 12.0–15.0)
Lymphocytes Relative: 14 % (ref 12–46)
Lymphs Abs: 1.4 10*3/uL (ref 0.7–4.0)
MCHC: 33.9 g/dL (ref 30.0–36.0)
Monocytes Absolute: 0.9 10*3/uL (ref 0.1–1.0)
Monocytes Relative: 9 % (ref 3–12)
Neutro Abs: 7.4 10*3/uL (ref 1.7–7.7)

## 2012-01-01 LAB — COMPREHENSIVE METABOLIC PANEL
BUN: 19 mg/dL (ref 6–23)
CO2: 27 mEq/L (ref 19–32)
Chloride: 89 mEq/L — ABNORMAL LOW (ref 96–112)
Creatinine, Ser: 1.02 mg/dL (ref 0.50–1.10)
GFR calc non Af Amer: 52 mL/min — ABNORMAL LOW (ref >90)
Glucose, Bld: 103 mg/dL — ABNORMAL HIGH (ref 70–99)
Total Bilirubin: 0.2 mg/dL — ABNORMAL LOW (ref 0.3–1.2)

## 2012-01-01 LAB — URINALYSIS, ROUTINE W REFLEX MICROSCOPIC
Ketones, ur: NEGATIVE mg/dL
Leukocytes, UA: NEGATIVE
Protein, ur: NEGATIVE mg/dL
Urobilinogen, UA: 0.2 mg/dL (ref 0.0–1.0)

## 2012-01-01 LAB — POCT I-STAT TROPONIN I: Troponin i, poc: 0 ng/mL (ref 0.00–0.08)

## 2012-01-01 NOTE — ED Notes (Signed)
Ekg shown to Dr. Rulon Abide, copies in the chart

## 2012-01-01 NOTE — ED Provider Notes (Signed)
History     CSN: [id number]  Arrival date & time 01/01/12  2047   First MD Initiated Contact with Patient 01/01/12 2050      Chief Complaint  Patient presents with  . Near Syncope    (Consider location/radiation/quality/duration/timing/severity/associated sxs/prior treatment) HPI Erika [name] is a 78 y.o. female who presents to Er complaint of an episode of dizziness and near syncope. Pt states they just finished with a dinner party and she was putting some things up and cleaning when suddenly she started feeling dizzy, light headed. States had to sit down because felt like she was going to pass out. Stats when she sat down her vision was blurry and she could not see anything. States episode lasted about 20 min. EMS was called. State she is now feeling better. She denies any similar prior episodes. She denies headache. No nausea or vomiting. States during the episode she got pale, diaphoretic, and states felt very "hot." Pt denies any prior similar symptoms. Stats does get dizzy spells once in a while, but never lasting this long or having blurred vision.    Past Medical History  Diagnosis Date  . Hyperthyroidism   . GERD (gastroesophageal reflux disease)   . Hypertension   . Seasonal allergies   . Hyperlipidemia   . Obesity   . Arthritis   . Abdominal pain   . Cardiac arrhythmia   . H/O hiatal hernia   . Anemia   . Unspecified hypothyroidism 06/15/2009    Qualifier: Diagnosis of  By: Susette Racer CMA, Jewel    . HYPERLIPIDEMIA 06/15/2009    Qualifier: Diagnosis of  By: Susette Racer CMA, Jewel    . OBESITY 06/15/2009    Qualifier: Diagnosis of  By: Susette Racer CMA, Jewel    . ANEMIA 06/15/2009    Qualifier: Diagnosis of  By: Susette Racer CMA, Jewel    . HYPERTENSION 06/15/2009    Qualifier: Diagnosis of  By: Susette Racer CMA, Jewel    . Atrial fibrillation 06/15/2009    Qualifier: Diagnosis of  By: Susette Racer CMA, Jewel    . SLEEP APNEA 06/16/2009    Qualifier: Diagnosis of  By: Johney Frame, MD, Fayrene Fearing    . Gallstones,  probable biliary colic 05/09/2011  . Aortic stenosis     moderate AS by 06/2011 echo    Past Surgical History  Procedure Date  . Thyroidectomy 1961    80% removed  . Pharyngeal repair [id number]    Dr [name] for post-TEE pharygeal tear w neck abscess I&D  . Breast surgery 2012 (June or July)    right  . Cholecystectomy 06/28/2011    Procedure: LAPAROSCOPIC CHOLECYSTECTOMY WITH INTRAOPERATIVE CHOLANGIOGRAM;  Surgeon: Robyne Askew, MD;  Location: [facility] OR;  Service: General;  Laterality: N/A;    Family History  Problem Relation Age of Onset  . Heart disease Mother   . Heart disease Father   . Cancer Maternal Aunt     breast    History  Substance Use Topics  . Smoking status: Never Smoker   . Smokeless tobacco: Never Used  . Alcohol Use: No    OB History    Grav Para Term Preterm Abortions TAB SAB Ect Mult Living                  Review of Systems  Constitutional: Negative for fever and chills.  HENT: Negative for ear pain, neck pain, neck stiffness and tinnitus.   Eyes: Positive for visual disturbance. Negative for photophobia, pain, discharge and redness.  Respiratory: Negative.   Cardiovascular: Negative.   Gastrointestinal: Negative.   Genitourinary: Negative for dysuria.  Musculoskeletal: Negative for myalgias and arthralgias.  Skin: Positive for color change.  Neurological: Positive for dizziness and light-headedness. Negative for syncope, weakness and headaches.  All other systems reviewed and are negative.    Allergies  Celecoxib; Amiodarone; Dronedarone hydrochloride; Ezetimibe; Flecainide acetate; and Sulfonamide derivatives  Home Medications   Current Outpatient Rx  Name  Route  Sig  Dispense  Refill  . DESLORATADINE 5 MG PO TABS   Oral   Take 5 mg by mouth at bedtime.          . ERGOCALCIFEROL 50000 UNITS PO CAPS   Oral   Take 50,000 Units by mouth once a week. Take on thursday         . HYDROCHLOROTHIAZIDE 25 MG PO TABS   Oral   Take 25 mg  by mouth daily.         Marland Kitchen HYDROCODONE-ACETAMINOPHEN 5-325 MG PO TABS   Oral   Take 1 tablet by mouth every 6 (six) hours as needed. For pain         . LEVOTHYROXINE SODIUM 175 MCG PO TABS   Oral   Take 175 mcg by mouth every morning.          Marland Kitchen LISINOPRIL 10 MG PO TABS   Oral   Take 30 mg by mouth daily.          . OMEGA-3-ACID ETHYL ESTERS 1 G PO CAPS   Oral   Take 1 g by mouth 2 (two) times daily.          Marland Kitchen ONDANSETRON 8 MG PO TBDP   Oral   Take 8 mg by mouth daily as needed. For nausea         . POTASSIUM CHLORIDE ER 10 MEQ PO TBCR   Oral   Take 10 mEq by mouth daily.          Marland Kitchen ZOLPIDEM TARTRATE 10 MG PO TABS   Oral   Take 10 mg by mouth at bedtime as needed. For sleep           BP 171/58  Pulse 64  Temp 97.6 F (36.4 C) (Oral)  SpO2 100%  Physical Exam  Nursing note and vitals reviewed. Constitutional: She is oriented to person, place, and time. She appears well-developed and well-nourished. No distress.  HENT:  Head: Normocephalic and atraumatic.  Mouth/Throat: Oropharynx is clear and moist.  Eyes: Conjunctivae normal and EOM are normal. Pupils are equal, round, and reactive to light.  Neck: Normal range of motion. Neck supple.  Cardiovascular: Normal rate and normal heart sounds.   No murmur heard.      Irregular rhythm  Pulmonary/Chest: Effort normal and breath sounds normal. No respiratory distress. She has no wheezes. She has no rales.  Abdominal: Soft. Bowel sounds are normal. She exhibits no distension. There is no tenderness. There is no rebound.  Musculoskeletal:       1+ pitting LE edema bilaterally  Neurological: She is alert and oriented to person, place, and time.       5/5 and equal upper and lower extremity strength bilaterally. Equal grip strength bilaterally. Normal finger to nose and heel to shin. No pronator drift.   Skin: Skin is warm and dry. No rash noted. No erythema. No pallor.  Psychiatric: She has a normal mood  and affect. Her behavior is normal.    ED Course  Procedures (including critical care time)   Date: 01/02/2012  Rate: 71  Rhythm: atrial fibrillation  QRS Axis: normal  Intervals: normal  ST/T Wave abnormalities: normal  Conduction Disutrbances:none  Narrative Interpretation:   Old EKG Reviewed: unchanged  Pt with near syncopal episode today. She is back to normal now, denies any complaints. Will get lags, CT head, monitor. ECG as above. She is afebrile, hypertensive, otherwise normal VS.   Results for orders placed during the hospital encounter of 01/01/12  CBC WITH DIFFERENTIAL      Component Value Range   WBC 9.8  4.0 - 10.5 K/uL   RBC 4.09  3.87 - 5.11 MIL/uL   Hemoglobin 11.8 (*) 12.0 - 15.0 g/dL   HCT 96.0 (*) 45.4 - 09.8 %   MCV 85.1  78.0 - 100.0 fL   MCH 28.9  26.0 - 34.0 pg   MCHC 33.9  30.0 - 36.0 g/dL   RDW 11.9  14.7 - 82.9 %   Platelets 225  150 - 400 K/uL   Neutrophils Relative 76  43 - 77 %   Neutro Abs 7.4  1.7 - 7.7 K/uL   Lymphocytes Relative 14  12 - 46 %   Lymphs Abs 1.4  0.7 - 4.0 K/uL   Monocytes Relative 9  3 - 12 %   Monocytes Absolute 0.9  0.1 - 1.0 K/uL   Eosinophils Relative 1  0 - 5 %   Eosinophils Absolute 0.1  0.0 - 0.7 K/uL   Basophils Relative 0  0 - 1 %   Basophils Absolute 0.0  0.0 - 0.1 K/uL  COMPREHENSIVE METABOLIC PANEL      Component Value Range   Sodium 127 (*) 135 - 145 mEq/L   Potassium 4.2  3.5 - 5.1 mEq/L   Chloride 89 (*) 96 - 112 mEq/L   CO2 27  19 - 32 mEq/L   Glucose, Bld 103 (*) 70 - 99 mg/dL   BUN 19  6 - 23 mg/dL   Creatinine, Ser 5.62  0.50 - 1.10 mg/dL   Calcium 9.7  8.4 - 13.0 mg/dL   Total Protein 7.0  6.0 - 8.3 g/dL   Albumin 3.7  3.5 - 5.2 g/dL   AST 18  0 - 37 U/L   ALT 13  0 - 35 U/L   Alkaline Phosphatase 83  39 - 117 U/L   Total Bilirubin 0.2 (*) 0.3 - 1.2 mg/dL   GFR calc non Af Amer 52 (*) >90 mL/min   GFR calc Af Amer 60 (*) >90 mL/min  URINALYSIS, ROUTINE W REFLEX MICROSCOPIC      Component  Value Range   Color, Urine YELLOW  YELLOW   APPearance CLEAR  CLEAR   Specific Gravity, Urine 1.007  1.005 - 1.030   pH 5.5  5.0 - 8.0   Glucose, UA NEGATIVE  NEGATIVE mg/dL   Hgb urine dipstick NEGATIVE  NEGATIVE   Bilirubin Urine NEGATIVE  NEGATIVE   Ketones, ur NEGATIVE  NEGATIVE mg/dL   Protein, ur NEGATIVE  NEGATIVE mg/dL   Urobilinogen, UA 0.2  0.0 - 1.0 mg/dL   Nitrite NEGATIVE  NEGATIVE   Leukocytes, UA NEGATIVE  NEGATIVE  POCT I-STAT TROPONIN I      Component Value Range   Troponin i, poc 0.00  0.00 - 0.08 ng/mL   Comment 3            Ct Head Wo Contrast  01/01/2012  *RADIOLOGY REPORT*  Clinical Data:  Near-syncope, dizziness, flushed, urinary incontinence, history hypertension, arrhythmia  CT HEAD WITHOUT CONTRAST  Technique:  Contiguous axial images were obtained from the base of the skull through the vertex without contrast.  Comparison: 06/23/2011  Findings: Mild generalized atrophy. Normal ventricular morphology. No midline shift or mass effect. Small vessel chronic ischemic changes of deep cerebral white matter most prominent in left hemisphere, similar to prior exam. No intracranial hemorrhage, mass lesion or evidence of acute infarction. No extra-axial fluid collections. Atherosclerotic calcifications at skull base. No acute osseous or sinus abnormalities.  IMPRESSION: Atrophy with small vessel chronic ischemic changes of deep cerebral white matter. No acute intracranial abnormalities.   Original Report Authenticated By: Ulyses Southward, M.D.    Dg Chest Portable 1 View  01/01/2012  *RADIOLOGY REPORT*  Clinical Data: Near-syncope.  PORTABLE CHEST - 1 VIEW  Comparison: Chest radiograph performed 06/25/2011  Findings: The lungs are well-aerated.  Mild chronic peribronchial thickening is noted.  Previously noted atelectasis at the right midlung zone is no longer seen.  Mild scarring is noted at the left lung base.  There is no evidence of pleural effusion or pneumothorax.  The  cardiomediastinal silhouette is enlarged; calcification is noted within the aortic arch.  No acute osseous abnormalities are seen.  IMPRESSION: Mild chronic peribronchial thickening noted; no acute cardiopulmonary process seen.  Cardiomegaly.   Original Report Authenticated By: Tonia Ghent, M.D.      1. Near syncope   2. Dizziness   3. Hyponatremia   4. Anemia        MDM  Pt with near syncopal episode while cleaning today. She has had similar episodes in the past, however, never this severe. She is here symptomatic. No cp, no headache, not having any more dizziness, weakness, visual changes. Pt has non focal neurological exam. Her labs show just a slight anemia, at her baseline, low sodium, which appears to be chronic for her as well. CT and chest xray with no acute findings. ECG showing chronic afib, rate controlled. Troponin negative. Discussed pt with dr. Rulon Abide, who saw pt as well. Pt was offered admission, however, she declined wanting to go home. I think at this time, pt  Is stable for d/c home with close pcp follow up. Her VS normal other than slightly elevated BP. She is ambulatory, no dizziness.   Filed Vitals:   01/01/12 2231 01/01/12 2235 01/01/12 2300 01/01/12 2330  BP: 143/46 168/55 136/46 144/50  Pulse: 65 91 65 69  Temp:      TempSrc:      Resp:   17 15  SpO2:   100% 100%           Lottie Mussel, PA 01/02/12 [phone]

## 2012-01-01 NOTE — ED Notes (Signed)
Per EMS: Pt at Christmas party and while standing experienced dizziness lasting appx 15. Pt reports feeling flushed all over. When pt stood had urine incontinence. Pt reports having previous episodes in June. Pt AO x4.   76 bpm A fib. 160/60. BG 146 Hx: HTN Denies pain.

## 2012-01-07 NOTE — ED Provider Notes (Signed)
Medical screening examination/treatment/procedure(s) were conducted as a shared visit with non-physician practitioner(s) and myself.  I personally evaluated the patient during the encounter Jones Skene, M.D.  Erika [name] is a 78 y.o. female presenting with near syncope.  Pt has had similar episodes in the past.  She was putting away things after dinner and suddenly felt hot.  This is a chronic occurrence, she typically then sits down and the feeling passes.  She sat down and felt a dizzy.  She was concerned because she had blurred vision for a short time. No visual field cuts. Symptoms have resolved. No symptoms of vertigo, syncope, palpitations, chest pain, shortness of breath, nausea, vomiting, tinnitus, ataxia,   At least 10pt or greater review of systems completed and are negative except where specified in the HPI.  VITAL SIGNS:   Filed Vitals:   01/02/12 0034  BP: 132/79  Pulse: 67  Temp:   Resp: 14   CONSTITUTIONAL: Awake, oriented, appears non-toxic HENT: Atraumatic, normocephalic, oral mucosa pink and moist, airway patent. Nares patent without drainage. External ears normal. EYES: Conjunctiva clear, EOMI, PERRLA NECK: Trachea midline, non-tender, supple CARDIOVASCULAR: Normal heart rate, irregular rhythm, No murmurs, rubs, gallops PULMONARY/CHEST: Clear to auscultation, no rhonchi, wheezes, or rales. Symmetrical breath sounds. Non-tender. ABDOMINAL: Non-distended, soft, non-tender - no rebound or guarding.  BS normal. NEUROLOGIC: UX:LKGMWN fields intact. PERRLA, EOMI.  Facial sensation equal to light touch bilaterally.  Good muscle bulk in the masseter muscle and good lateral movement of the jaw.  Facial expressions equal and good strength with smile/frown and puffed cheeks.  Hearing grossly intact to finger rub test.  Uvula, tongue are midline with no deviation. Symmetrical palate elevation.  Trapezius and SCM muscles are 5/5 strength bilaterally.   DTR: Brachioradialis, biceps,  patellar, Achilles tendon reflexes 2+ bilaterally.  No clonus. Strength: 5/5 strength flexors and extensors in the upper and lower extremities.  Grip strength, finger adduction/abduction 5/5. Sensation: Sensation intact distally to light touch Cerebellar: No ataxia with walking or dysmetria with finger to nose, rapid alternating hand movements and heels to shin testing. Gait and Station: Normal heel/toe, and tandem gait.  Negative Romberg, no pronator drift EXTREMITIES: No clubbing, cyanosis. 1+ edema BL SKIN: Warm, Dry, No erythema, No rash    Date: 01/07/2012  Rate: 71  Rhythm: atrial fibrillation  QRS Axis: normal  Intervals: normal  ST/T Wave abnormalities: normal  Conduction Disutrbances: none  Narrative Interpretation: unremarkable  UUV:Erika [name] is a 78 y.o. female presenting with flushed episode, dizziness and blurred vision, now resolved.  Pt has had similar episodes in the past, w/ exception of blurred vision. She has been worked up for TIA in the past, refuses Plavix therapy.  No further workup for TIA to be done.   AFib is rate controlled. My suspicion is low for bilateral blurred vision being caused by TIA - it also seems unlikely that her dizziness would pass but blurred vision remain and that be caused by a cardiac etiology.  Discussed findings with pt of workup.  Pt elects to go home and follow up with PCP.  At this point,  I do not think any emergency exists.  I explained that the most conservative thing to do would be to admit and observe, but in the course of shared decision making, the patient says she feels fine and would like to go home.  She understands she should return to the ER for any change or worsening of symptoms.    Jones Skene, MD  01/07/12 1846 

## 2012-04-16 ENCOUNTER — Other Ambulatory Visit: Payer: Self-pay

## 2012-04-16 DIAGNOSIS — Z1231 Encounter for screening mammogram for malignant neoplasm of breast: Secondary | ICD-10-CM

## 2012-04-20 ENCOUNTER — Ambulatory Visit (INDEPENDENT_AMBULATORY_CARE_PROVIDER_SITE_OTHER): Payer: Medicare Other | Admitting: Internal Medicine

## 2012-04-20 VITALS — BP 192/70 | HR 66 | Temp 97.8°F | Resp 16 | Ht 67.0 in | Wt 214.0 lb

## 2012-04-20 DIAGNOSIS — R350 Frequency of micturition: Secondary | ICD-10-CM

## 2012-04-20 DIAGNOSIS — N390 Urinary tract infection, site not specified: Secondary | ICD-10-CM

## 2012-04-20 LAB — POCT URINALYSIS DIPSTICK
Protein, UA: NEGATIVE
Spec Grav, UA: 1.01
Urobilinogen, UA: 0.2
pH, UA: 7

## 2012-04-20 LAB — POCT UA - MICROSCOPIC ONLY
Casts, Ur, LPF, POC: NEGATIVE
Crystals, Ur, HPF, POC: NEGATIVE
Epithelial cells, urine per micros: NEGATIVE

## 2012-04-20 MED ORDER — FLUCONAZOLE 150 MG PO TABS: 150.0000 mg | ORAL_TABLET | Freq: Once | ORAL | Status: DC

## 2012-04-20 MED ORDER — CIPROFLOXACIN HCL 250 MG PO TABS: 250.0000 mg | ORAL_TABLET | Freq: Two times a day (BID) | ORAL | Status: DC

## 2012-04-20 NOTE — Patient Instructions (Addendum)
Ciprofloxacin tablets What is this medicine? CIPROFLOXACIN (sip roe FLOX a sin) is a quinolone antibiotic. It is used to treat certain kinds of bacterial infections. It will not work for colds, flu, or other viral infections. This medicine may be used for other purposes; ask your health care provider or pharmacist if you have questions. What should I tell my health care provider before I take this medicine? They need to know if you have any of these conditions: -heart condition -joint problems -kidney disease -liver disease -myasthenia gravis -seizure disorder -an unusual or allergic reaction to ciprofloxacin, other antibiotics or medicines, foods, dyes, or preservatives -pregnant or trying to get pregnant -breast-feeding How should I use this medicine? Take this medicine by mouth with a glass of water. Follow the directions on the prescription label. Take your medicine at regular intervals. Do not take your medicine more often than directed. Take all of your medicine as directed even if you think your are better. Do not skip doses or stop your medicine early. You can take this medicine with food or on an empty stomach. It can be taken with a meal that contains dairy or calcium, but do not take it alone with a dairy product, like milk or yogurt or calcium-fortified juice. A special MedGuide will be given to you by the pharmacist with each prescription and refill. Be sure to read this information carefully each time. Talk to your pediatrician regarding the use of this medicine in children. Special care may be needed. Overdosage: If you think you have taken too much of this medicine contact a poison control center or emergency room at once. NOTE: This medicine is only for you. Do not share this medicine with others. What if I miss a dose? If you miss a dose, take it as soon as you can. If it is almost time for your next dose, take only that dose. Do not take double or extra doses. What may  interact with this medicine? Do not take this medicine with any of the following medications: -cisapride -droperidol -terfenadine -tizanidine This medicine may also interact with the following medications: -antacids -caffeine -cyclosporin -didanosine (ddI) buffered tablets or powder -medicines for diabetes -medicines for inflammation like ibuprofen, naproxen -methotrexate -multivitamins -omeprazole -phenytoin -probenecid -sucralfate -theophylline -warfarin This list may not describe all possible interactions. Give your health care provider a list of all the medicines, herbs, non-prescription drugs, or dietary supplements you use. Also tell them if you smoke, drink alcohol, or use illegal drugs. Some items may interact with your medicine. What should I watch for while using this medicine? Tell your doctor or health care professional if your symptoms do not improve. Do not treat diarrhea with over the counter products. Contact your doctor if you have diarrhea that lasts more than 2 days or if it is severe and watery. You may get drowsy or dizzy. Do not drive, use machinery, or do anything that needs mental alertness until you know how this medicine affects you. Do not stand or sit up quickly, especially if you are an older patient. This reduces the risk of dizzy or fainting spells. This medicine can make you more sensitive to the sun. Keep out of the sun. If you cannot avoid being in the sun, wear protective clothing and use sunscreen. Do not use sun lamps or tanning beds/booths. Avoid antacids, aluminum, calcium, iron, magnesium, and zinc products for 6 hours before and 2 hours after taking a dose of this medicine. What side effects may I  notice from receiving this medicine? Side effects that you should report to your doctor or health care professional as soon as possible: -allergic reactions like skin rash, itching or hives, swelling of the face, lips, or tongue -breathing  problems -confusion, nightmares or hallucinations -feeling faint or lightheaded, falls -irregular heartbeat -joint, muscle or tendon pain or swelling -pain or trouble passing urine -redness, blistering, peeling or loosening of the skin, including inside the mouth -seizure -unusual pain, numbness, tingling, or weakness Side effects that usually do not require medical attention (report to your doctor or health care professional if they continue or are bothersome): -diarrhea -nausea or stomach upset -white patches or sores in the mouth This list may not describe all possible side effects. Call your doctor for medical advice about side effects. You may report side effects to FDA at 1-800-FDA-1088. Where should I keep my medicine? Keep out of the reach of children. Store at room temperature below 30 degrees C (86 degrees F). Keep container tightly closed. Throw away any unused medicine after the expiration date. NOTE: This sheet is a summary. It may not cover all possible information. If you have questions about this medicine, talk to your doctor, pharmacist, or health care provider.  2012, [employer]. (03/14/2009 11:41:11 AM)

## 2012-04-20 NOTE — Progress Notes (Addendum)
Subjective:    Patient ID: Erika [name], female    DOB: [birth year], 79 y.o.   MRN: [id number]  HPI u freq on off  1 1/2 days \\occas  dysu and pos supr-pub pressure Vag itch today-has hx yeast problems No fever or abd pain Last uti more than 1 yr ago  Current outpatient prescriptions: desloratadine (CLARINEX) 5 MG tablet, Take 5 mg by mouth at bedtime. , Disp: , Rfl: ;  ergocalciferol (VITAMIN D2) 50000 UNITS capsule, Take 50,000 Units by mouth once a week. Take on thursday, Disp: , hydrochlorothiazide (HYDRODIURIL) 25 MG tablet, Take 25 mg by mouth daily., Disp: , Rfl: ;  HYDROcodone-acetaminophen (NORCO) 5-325 MG per tablet, Take 1 tablet by mouth every 6 (six) hours as needed. For pain, Disp: , Rfl: ;  levothyroxine (SYNTHROID, LEVOTHROID) 175 MCG tablet, Take 175 mcg by mouth every morning. , Disp: , Rfl:  lisinopril (PRINIVIL,ZESTRIL) 10 MG tablet, Take 30 mg by mouth daily. , Disp: , Rfl: ;  omega-3 acid ethyl esters (LOVAZA) 1 G capsule, Take 1 g by mouth 2 (two) times daily. , Disp: , Rfl: ;  ondansetron (ZOFRAN-ODT) 8 MG disintegrating tablet, Take 8 mg by mouth daily as needed. For nausea, Disp: , Rfl: ;  potassium chloride (K-DUR) 10 MEQ tablet, Take 10 mEq by mouth daily. , Disp: , Rfl:  zolpidem (AMBIEN) 10 MG tablet, Take 10 mg by mouth at bedtime as needed. For sleep, Disp: , Rfl:   Review of Systems     Objective:   Physical Exam BP 192/70  Pulse 66  Temp(Src) 97.8 F (36.6 C) (Oral)  Resp 16  Ht 5\' 7"  (1.702 m)  Wt 214 lb (97.07 kg)  BMI 33.51 kg/m2 No cva tend to perc abd-benign   Results for orders placed in visit on 04/20/12  POCT UA - MICROSCOPIC ONLY      Result Value Range   WBC, Ur, HPF, POC tntc     RBC, urine, microscopic 0-1     Bacteria, U Microscopic trace     Mucus, UA neg     Epithelial cells, urine per micros neg     Crystals, Ur, HPF, POC neg     Casts, Ur, LPF, POC neg     Yeast, UA neg    POCT URINALYSIS DIPSTICK      Result Value Range   Color, UA yellow     Clarity, UA cloudy     Glucose, UA neg     Bilirubin, UA neg     Ketones, UA neg     Spec Grav, UA 1.010     Blood, UA trace-lysed     pH, UA 7.0     Protein, UA neg     Urobilinogen, UA 0.2     Nitrite, UA neg     Leukocytes, UA large (3+)         Assessment & Plan:  Urinary tract infection, site not specified - Plan: ciprofloxacin (CIPRO) 250 MG tablet, Urine culture  Urinary frequency - Plan: POCT UA - Microscopic Only, POCT urinalysis dipstick  HTN uncontr--to F/U PCP Meds ordered this encounter  Medications  . fluconazole (DIFLUCAN) 150 MG tablet    Sig: Take 1 tablet (150 mg total) by mouth once.    Dispense:  1 tablet    Refill:  0  . ciprofloxacin (CIPRO) 250 MG tablet    Sig: Take 1 tablet (250 mg total) by mouth 2 (two) times daily.    Dispense:  6 tablet    Refill:  0

## 2012-04-23 LAB — URINE CULTURE

## 2012-05-21 ENCOUNTER — Ambulatory Visit
Admission: RE | Admit: 2012-05-21 | Discharge: 2012-05-21 | Disposition: A | Discharge status: Home / Self Care | Payer: Medicare Other | Source: Ambulatory Visit

## 2012-05-21 DIAGNOSIS — Z1231 Encounter for screening mammogram for malignant neoplasm of breast: Secondary | ICD-10-CM

## 2012-11-06 ENCOUNTER — Other Ambulatory Visit: Payer: Self-pay | Admitting: Family Medicine

## 2012-11-06 ENCOUNTER — Ambulatory Visit
Admission: RE | Admit: 2012-11-06 | Discharge: 2012-11-06 | Disposition: A | Discharge status: Home / Self Care | Payer: Medicare Other | Source: Ambulatory Visit | Attending: Family Medicine | Admitting: Family Medicine

## 2012-11-06 DIAGNOSIS — M545 Low back pain: Secondary | ICD-10-CM

## 2013-04-22 ENCOUNTER — Other Ambulatory Visit: Payer: Self-pay

## 2013-04-22 DIAGNOSIS — Z1231 Encounter for screening mammogram for malignant neoplasm of breast: Secondary | ICD-10-CM

## 2013-05-22 ENCOUNTER — Ambulatory Visit
Admission: RE | Admit: 2013-05-22 | Discharge: 2013-05-22 | Disposition: A | Discharge status: Home / Self Care | Payer: Medicare Other | Source: Ambulatory Visit

## 2013-05-22 ENCOUNTER — Encounter (INDEPENDENT_AMBULATORY_CARE_PROVIDER_SITE_OTHER): Payer: Self-pay

## 2013-05-22 DIAGNOSIS — Z1231 Encounter for screening mammogram for malignant neoplasm of breast: Secondary | ICD-10-CM

## 2013-06-15 IMAGING — CR DG CHEST 2V
2 series · 2 of 2 positions shown · non-contrast
Comparison: Multiple priors, most recent 06/22/2011.

CLINICAL DATA: Preoperative radiograph

CHEST - 2 VIEW

[view not recorded (1 of 2)]
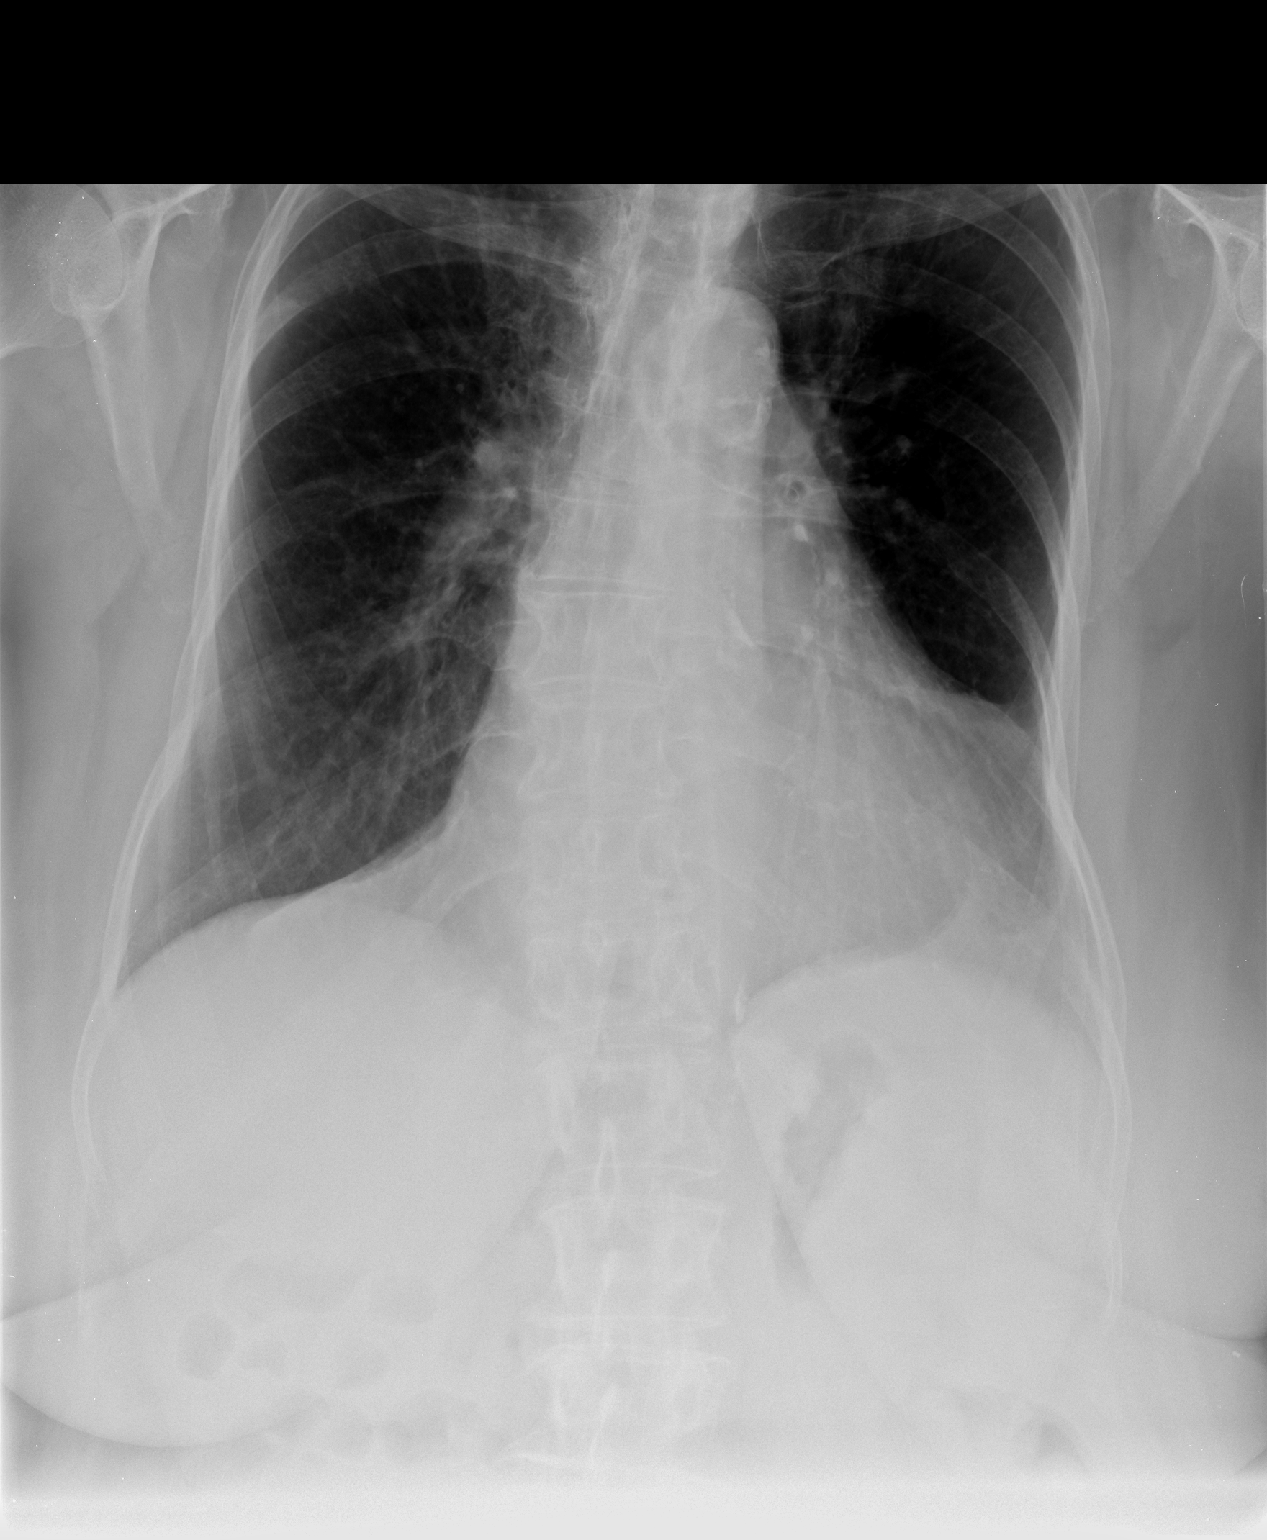

[view not recorded (2 of 2)]
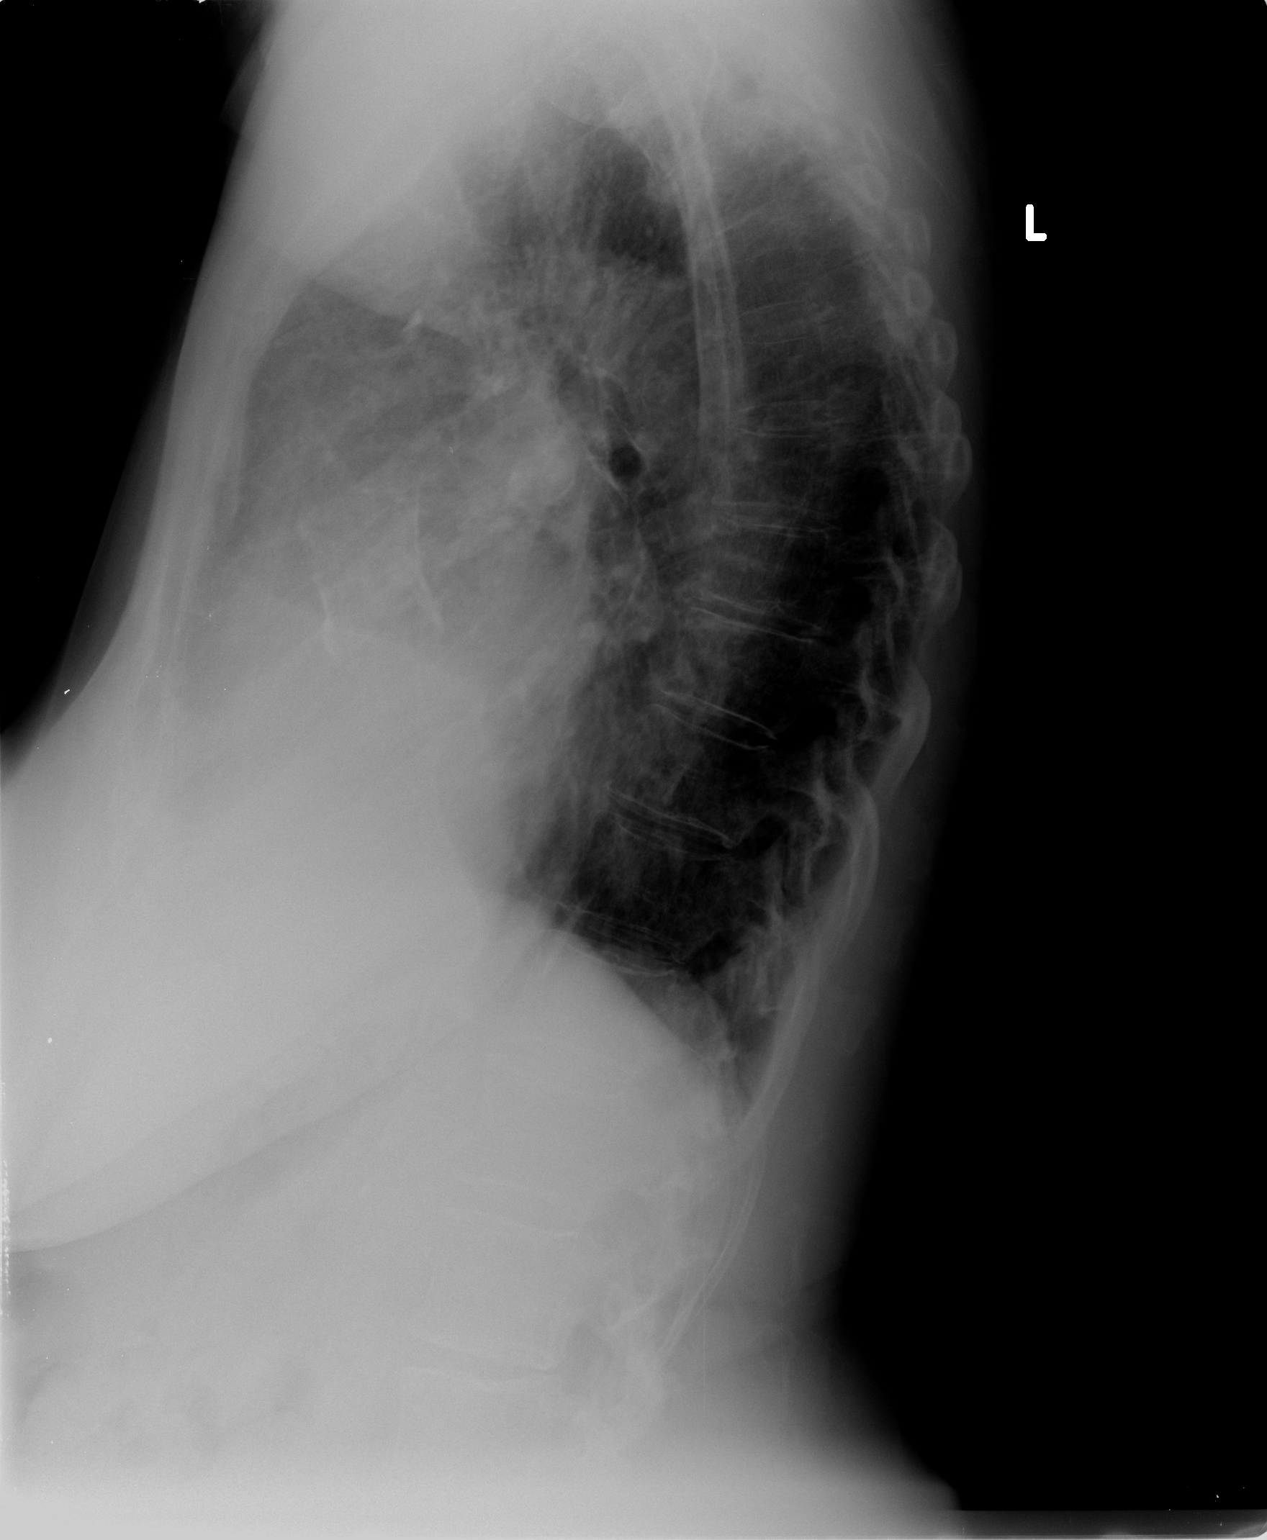

[2 of 2 positions shown; findings below may reference images not displayed]

FINDINGS: Mild retrocardiac opacity is without significant interval
change.  Cardiomegaly.  Aortic atherosclerosis.  Central vascular
ingestion.  Mild interstitial prominence. Biapical thickening is
similar to prior. Triangular nodular opacity projecting over the
right upper lung is without interval change over numerous priors
and also likely reflects scarring.  No additional areas of
consolidation.  No pleural effusion or pneumothorax.  Mild height
loss of multiple vertebral bodies.  Osteopenia.
IMPRESSION: Biapical scarring. Retrocardiac scarring versus atelectasis.
Otherwise, no radiographic evidence of acute cardiopulmonary
process.

Cardiomegaly.  Aortic atherosclerosis.

## 2014-05-19 ENCOUNTER — Other Ambulatory Visit: Payer: Self-pay

## 2014-05-19 DIAGNOSIS — Z1231 Encounter for screening mammogram for malignant neoplasm of breast: Secondary | ICD-10-CM

## 2014-07-02 ENCOUNTER — Ambulatory Visit
Admission: RE | Admit: 2014-07-02 | Discharge: 2014-07-02 | Disposition: A | Discharge status: Home / Self Care | Payer: Medicare Other | Source: Ambulatory Visit

## 2014-07-02 DIAGNOSIS — Z1231 Encounter for screening mammogram for malignant neoplasm of breast: Secondary | ICD-10-CM

## 2015-06-17 ENCOUNTER — Ambulatory Visit (INDEPENDENT_AMBULATORY_CARE_PROVIDER_SITE_OTHER): Payer: Medicare Other | Admitting: Physician Assistant

## 2015-06-17 VITALS — BP 160/70 | HR 65 | Temp 97.9°F | Resp 15 | Ht 67.0 in | Wt 202.0 lb

## 2015-06-17 DIAGNOSIS — R3915 Urgency of urination: Secondary | ICD-10-CM

## 2015-06-17 DIAGNOSIS — R350 Frequency of micturition: Secondary | ICD-10-CM | POA: Diagnosis not present

## 2015-06-17 DIAGNOSIS — N898 Other specified noninflammatory disorders of vagina: Secondary | ICD-10-CM

## 2015-06-17 DIAGNOSIS — L298 Other pruritus: Secondary | ICD-10-CM | POA: Diagnosis not present

## 2015-06-17 LAB — POCT URINALYSIS DIP (MANUAL ENTRY)
Bilirubin, UA: NEGATIVE
Glucose, UA: NEGATIVE
Ketones, POC UA: NEGATIVE
Leukocytes, UA: NEGATIVE
Nitrite, UA: NEGATIVE
PROTEIN UA: NEGATIVE
RBC UA: NEGATIVE
SPEC GRAV UA: 1.01
Urobilinogen, UA: 0.2
pH, UA: 7.5

## 2015-06-17 LAB — POC MICROSCOPIC URINALYSIS (UMFC): Mucus: ABSENT

## 2015-06-17 MED ORDER — FLUCONAZOLE 150 MG PO TABS: 150.0000 mg | ORAL_TABLET | Freq: Once | ORAL | Status: DC

## 2015-06-17 NOTE — Progress Notes (Signed)
Patient ID: Erika [name], female    DOB: [birth year], 82 y.o.   MRN: [id number]  PCP: Tamsen Roers, MD  Subjective:   Chief Complaint  Patient presents with  . Dysuria    described as pressure. Pt was seen by Dr. Rolla Etienne, medication not working.     HPI Presents for evaluation of persistent urinary urgency, frequency and pressure, now with vaginal itching.  She was seen by her PCP on 6/05 and treated for UTI with Cipro. Her symptoms persist, and she now haw vaginal itching. The PCP office is closed today (Friday).  Completed the ciprofloxacin 500 mg 1 PO BID. Reports that she took the last pill this morning, but her empty bottle indicates that there were #12 pills initially. At any rate, she's had no improvement without benefit.   No nausea, vomiting, fever, chills. No back or abdominal pain.  She believes that her BP is elevated today because of her frustration over persistent symptoms.   Review of Systems As above. No CP, SOB, HA, Dizziness. No nausea, vomiting, diarrhea, constipation.    Patient Active Problem List   Diagnosis Date Noted  . Anticoagulated on warfarin 05/09/2011  . Gallstones, probable biliary colic 123456  . GERD (gastroesophageal reflux disease) 05/09/2011  . SLEEP APNEA 06/16/2009  . UNSPECIFIED HYPOTHYROIDISM 06/15/2009  . HYPERLIPIDEMIA 06/15/2009  . OBESITY 06/15/2009  . ANEMIA 06/15/2009  . HYPERTENSION 06/15/2009  . ATRIAL FIBRILLATION 06/15/2009     Prior to Admission medications   Medication Sig Start Date End Date Taking? Authorizing Provider  amLODipine (NORVASC) 5 MG tablet See admin instructions. 05/19/15  Yes Historical Provider, MD  Cyanocobalamin (B-12 IJ) Inject 1,000 mcg as directed every 30 (thirty) days.   Yes Historical Provider, MD  ELIQUIS 5 MG TABS tablet  05/18/15  Yes Historical Provider, MD  ergocalciferol (VITAMIN D2) 50000 UNITS capsule Take 50,000 Units by mouth once a week. Take on thursday   Yes Historical  Provider, MD  furosemide (LASIX) 20 MG tablet Take 20 mg by mouth.   Yes Historical Provider, MD  levothyroxine (SYNTHROID, LEVOTHROID) 175 MCG tablet Take 175 mcg by mouth every morning.    Yes Historical Provider, MD  lisinopril (PRINIVIL,ZESTRIL) 30 MG tablet Take 30 mg by mouth daily.   Yes Historical Provider, MD  losartan (COZAAR) 100 MG tablet Take 100 mg by mouth daily.   Yes Historical Provider, MD  mirabegron ER (MYRBETRIQ) 25 MG TB24 tablet Take 25 mg by mouth daily.   Yes Historical Provider, MD  omega-3 acid ethyl esters (LOVAZA) 1 G capsule Take 1 g by mouth 2 (two) times daily.    Yes Historical Provider, MD  omeprazole (PRILOSEC) 20 MG capsule  05/23/15  Yes Historical Provider, MD     Allergies  Allergen Reactions  . Celecoxib Hives and Itching    Face only  . Amiodarone     Ineffective   . Dronedarone Hydrochloride     Edema   . Ezetimibe     Fatigue   . Flecainide Acetate     fatigue  . Sulfonamide Derivatives Rash    "Mouth breaks out"       Objective:  Physical Exam  Constitutional: She is oriented to person, place, and time. She appears well-developed and well-nourished. She is active and cooperative. No distress.  BP 160/70 mmHg  Pulse 65  Temp(Src) 97.9 F (36.6 C) (Oral)  Resp 15  Ht 5\' 7"  (1.702 m)  Wt 202 lb (91.627 kg)  BMI 31.63  kg/m2  SpO2 99%  HENT:  Head: Normocephalic and atraumatic.  Right Ear: Hearing normal.  Left Ear: Hearing normal.  Eyes: Conjunctivae are normal. No scleral icterus.  Neck: Normal range of motion. Neck supple. No thyromegaly present.  Cardiovascular: Normal rate, regular rhythm and normal heart sounds.   Pulses:      Radial pulses are 2+ on the right side, and 2+ on the left side.  Pulmonary/Chest: Effort normal and breath sounds normal.  Abdominal: Soft. Bowel sounds are normal. There is no tenderness.  Lymphadenopathy:       Head (right side): No tonsillar, no preauricular, no posterior auricular and no  occipital adenopathy present.       Head (left side): No tonsillar, no preauricular, no posterior auricular and no occipital adenopathy present.    She has no cervical adenopathy.       Right: No supraclavicular adenopathy present.       Left: No supraclavicular adenopathy present.  Neurological: She is alert and oriented to person, place, and time. No sensory deficit.  Skin: Skin is warm, dry and intact. No rash noted. No cyanosis or erythema. Nails show no clubbing.  Psychiatric: She has a normal mood and affect. Her speech is normal and behavior is normal.       Results for orders placed or performed in visit on 06/17/15  POCT urinalysis dipstick  Result Value Ref Range   Color, UA yellow yellow   Clarity, UA clear clear   Glucose, UA negative negative   Bilirubin, UA negative negative   Ketones, POC UA negative negative   Spec Grav, UA 1.010    Blood, UA negative negative   pH, UA 7.5    Protein Ur, POC negative negative   Urobilinogen, UA 0.2    Nitrite, UA Negative Negative   Leukocytes, UA Negative Negative  POCT Microscopic Urinalysis ([facility])  Result Value Ref Range   WBC,UR,HPF,POC None None WBC/hpf   RBC,UR,HPF,POC None None RBC/hpf   Bacteria None None, Too numerous to count   Mucus Absent Absent   Epithelial Cells, UR Per Microscopy None None, Too numerous to count cells/hpf       Assessment & Plan:   1. Urinary frequency 2. Urinary urgency No evidence of persistent UTI. - POCT urinalysis dipstick - POCT Microscopic Urinalysis ([facility])  3. Vaginal itching Urinary symptoms and vaginal itching most likely due to yeast vaginitis secondary to recent antibiotic treatment. - fluconazole (DIFLUCAN) 150 MG tablet; Take 1 tablet (150 mg total) by mouth once. Repeat if needed  Dispense: 2 tablet; Refill: 0  She is encouraged to contact her PCP 6/12 if she has any persistent symptoms, and to schedule a follow-up visit for her blood pressure. In addition, she's advised  to obtain an updated medication list, as there is a discrepancy in the levothyroxine dose and it's unclear if she is taking both ARB and ACEI.   Fara Chute, PA-C Physician Assistant-Certified [facility]

## 2015-06-17 NOTE — Progress Notes (Signed)
   Subjective:    Patient ID: [name], female    DOB: 12-Jan-1932, 82 y.o.   MRN: [id number]  Chief Complaint  Patient presents with  . Dysuria    described as pressure. Pt was seen by Dr. Rolla Etienne, medication not working.     HPI  Patient presents today with complaints of increased frequency, mild pelvic pain and vaginal itching x 4 days.   She went to her PCP on Monday he he gave her of cipro 500mg  BID x6 days.  She states no improvement from antibiotic, which she finished today, and he is closed on fridays so she came here.   Admits to mild burning with urination starting today, and very little back pain "I wouldn't even complain about it", and constipation x 1 week.  Denies fever, urgency, abdominal pain, headache, chest pain, and past medical history of kidney infections.   Review of Systems Pertinent items noted in HPI and remainder of comprehensive ROS otherwise negative.      Objective: BP 160/70 mmHg  Pulse 65  Temp(Src) 97.9 F (36.6 C) (Oral)  Resp 15  Ht 5\' 7"  (1.702 m)  Wt 202 lb (91.627 kg)  BMI 31.63 kg/m2  SpO2 99%   Physical Exam  Constitutional: She is oriented to person, place, and time. She appears well-developed and well-nourished.  Cardiovascular: Normal rate, regular rhythm and normal heart sounds.   Pulmonary/Chest: Effort normal and breath sounds normal.  Abdominal: Soft. Bowel sounds are normal. She exhibits no distension. There is no tenderness. There is no rebound.  Musculoskeletal: Normal range of motion.  Neurological: She is alert and oriented to person, place, and time.   Urine dipstick shows negative for all components.  Micro exam: negative for WBC's or RBC's.     Assessment & Plan:  1. Urinary frequency 2. Urinary urgency - POCT urinalysis dipstick - POCT Microscopic Urinalysis ([facility])  Informed patient urine analysis negative for infection and discussed possible causes of urinary urgency and frequency including constipation.   Recommended use of Miralax to help get things moving and likely decrease urinary symptoms. Recommended patient drink plenty of water and contact her PCP on Monday if symptoms persist.  3. Vaginal itching Discussed with patient likelihood that vaginal itching developed from an overgrowth of yeast in the vagina as a result of antibiotic use.  Prescribed Diflucan and instructed patient to take 1 pill now and the other in 3-7 days if symptoms continue. - fluconazole (DIFLUCAN) 150 MG tablet; Take 1 tablet (150 mg total) by mouth once. Repeat if needed  Dispense: 2 tablet; Refill: 0  Informed patient to call her PCP on Monday to schedule a follow up.  I am concerned about her elevated BP, which is likely due to discomfort and want to be re-evaluated to ensure it is adequately controlled.  Also concerned about her finishing her 6 day supply of antibiotic in 4 days and whether or not she is clear about how to take her medication.  Recommended patient get an updated medication list to ensure she knows whats in her current daily regimen including the correct dosage she is to be taking and to put a copy in her purse for visits to urgent care or ED.  Patient informed if any questions or concerns arise to contact office and we will be happy to address them.  Gracin Soohoo P. Hulon Ferron, PA-S

## 2015-06-17 NOTE — Patient Instructions (Addendum)
Your urine specimen shows no infection today. I suspect that the symptoms are due to yeast that has overgrown in the vagina as a result of the antibiotic.  Please drink plenty of water and contact Dr. Rex Kras on Monday if your symptoms persist.  I am also concerned about your elevated blood pressure, which may be due to your discomfort.  It is worth getting it checked when you are feeling well to make sure it's controlled.   In addition, ask Dr. Rex Kras for an updated medication list so you'll be sure to have the correct dose of the Synthroid.    IF you received an x-ray today, you will receive an invoice from [facility]. Please contact [facility] at [phone] with questions or concerns regarding your invoice.   IF you received labwork today, you will receive an invoice from [employer]. Please contact [facility] at [phone] with questions or concerns regarding your invoice.   Our billing staff will not be able to assist you with questions regarding bills from these companies.  You will be contacted with the lab results as soon as they are available. The fastest way to get your results is to activate your My Chart account. Instructions are located on the last page of this paperwork. If you have not heard from [name] regarding the results in 2 weeks, please contact this office.

## 2016-03-26 ENCOUNTER — Ambulatory Visit: Payer: Medicare Other | Admitting: Diagnostic Neuroimaging

## 2016-04-04 ENCOUNTER — Encounter (INDEPENDENT_AMBULATORY_CARE_PROVIDER_SITE_OTHER): Payer: Self-pay

## 2016-04-04 ENCOUNTER — Encounter: Payer: Self-pay | Admitting: Neurology

## 2016-04-04 ENCOUNTER — Ambulatory Visit (INDEPENDENT_AMBULATORY_CARE_PROVIDER_SITE_OTHER): Payer: Medicare Other | Admitting: Neurology

## 2016-04-04 DIAGNOSIS — R413 Other amnesia: Secondary | ICD-10-CM | POA: Diagnosis not present

## 2016-04-04 HISTORY — DX: Other amnesia: R41.3

## 2016-04-04 MED ORDER — DONEPEZIL HCL 5 MG PO TABS: 5.0000 mg | ORAL_TABLET | Freq: Every day | ORAL | 1 refills | Status: DC

## 2016-04-04 NOTE — Progress Notes (Signed)
Reason for visit: Memory disturbance  Referring physician: Dr. Ricki Rodriguez is a 83 y.o. female   History of present illness:  Ms. Crotteau is an 83 year old right-handed white female with a history of a memory disturbance that has been noticeable by the family within the last 6 months. The patient has been functioning relatively independently. She drives a car, she has not had any problems with directions or difficulty with safety issues while driving. Her daughter has taken over paying the bills within the last 3 or 4 months, as she has been forgetting to pay some of the bills. She has been able to manage her own medications and appointments, she denies any significant problems with remembering recent events. She will have some problems with remembering names for people at times. She sleeps well at night, she reports good energy levels in the morning but she may be fatigued by the afternoon. She has had a chronic gait issue, she has not had any recent falls, occasionally she will use a cane when she is outside of the house. She denies any issues controlling the bowels or the bladder. She denies headaches, dizziness, or any recent weight loss. The patient denies a family history of memory problems. She denies any focal numbness or weakness of the face, arms, or legs. She does have atrial fibrillation, she is on chronic anticoagulation. She is sent to this office for an evaluation. She has a history of vitamin B12 deficiency, she gets monthly B12 shots.  Past Medical History:  Diagnosis Date  . Abdominal pain   . Anemia   . ANEMIA 06/15/2009   Qualifier: Diagnosis of  By: Selena Batten CMA, [place]    . Aortic stenosis    moderate AS by 06/2011 echo  . Arthritis   . Atrial fibrillation ([place]) 06/15/2009   Qualifier: Diagnosis of  By: Selena Batten CMA, Jewel    . Cardiac arrhythmia   . Gallstones, probable biliary colic 09/10/2669  . GERD (gastroesophageal reflux disease)   . H/O hiatal hernia   .  Hyperlipidemia   . HYPERLIPIDEMIA 06/15/2009   Qualifier: Diagnosis of  By: Selena Batten CMA, Jewel    . Hypertension   . HYPERTENSION 06/15/2009   Qualifier: Diagnosis of  By: Selena Batten CMA, Jewel    . Hyperthyroidism   . Memory disorder 04/04/2016  . Obesity   . OBESITY 06/15/2009   Qualifier: Diagnosis of  By: Selena Batten CMA, Jewel    . Seasonal allergies   . SLEEP APNEA 06/16/2009   Qualifier: Diagnosis of  By: Rayann Heman, MD, Jeneen Rinks    . Unspecified hypothyroidism 06/15/2009   Qualifier: Diagnosis of  By: Selena Batten CMA, Jewel      Past Surgical History:  Procedure Laterality Date  . BREAST SURGERY  2012 (June or July)   right  . CHOLECYSTECTOMY  06/28/2011   Procedure: LAPAROSCOPIC CHOLECYSTECTOMY WITH INTRAOPERATIVE CHOLANGIOGRAM;  Surgeon: Merrie Roof, MD;  Location: [facility];  Service: General;  Laterality: N/A;  . pharyngeal repair  [id number]   Dr Constance Holster for post-TEE pharygeal tear w neck abscess I&D  . THYROIDECTOMY  1961   80% removed    Family History  Problem Relation Age of Onset  . Heart disease Mother   . Heart disease Father   . Cancer Maternal Aunt     breast    Social history:  reports that she has never smoked. She has never used smokeless tobacco. She reports that she does not drink alcohol or use drugs.  Medications:  Prior to Admission medications   Medication Sig Start Date End Date Taking? Authorizing Provider  amLODipine (NORVASC) 5 MG tablet See admin instructions. 05/19/15  Yes Historical Provider, MD  Cyanocobalamin (B-12 IJ) Inject 1,000 mcg as directed every 30 (thirty) days.   Yes Historical Provider, MD  ELIQUIS 5 MG TABS tablet  05/18/15  Yes Historical Provider, MD  ergocalciferol (VITAMIN D2) 50000 UNITS capsule Take 50,000 Units by mouth once a week. Take on thursday   Yes Historical Provider, MD  furosemide (LASIX) 20 MG tablet Take 20 mg by mouth.   Yes Historical Provider, MD  levothyroxine (SYNTHROID, LEVOTHROID) 175 MCG tablet Take 175 mcg by mouth every morning.     Yes Historical Provider, MD  lisinopril (PRINIVIL,ZESTRIL) 30 MG tablet Take 30 mg by mouth daily.   Yes Historical Provider, MD  losartan (COZAAR) 100 MG tablet Take 100 mg by mouth daily.   Yes Historical Provider, MD  mirabegron ER (MYRBETRIQ) 25 MG TB24 tablet Take 25 mg by mouth daily.   Yes Historical Provider, MD  omega-3 acid ethyl esters (LOVAZA) 1 G capsule Take 1 g by mouth 2 (two) times daily.    Yes Historical Provider, MD  omeprazole (PRILOSEC) 20 MG capsule  05/23/15  Yes Historical Provider, MD  donepezil (ARICEPT) 5 MG tablet Take 1 tablet (5 mg total) by mouth at bedtime. 04/04/16   Kathrynn Ducking, MD      Allergies  Allergen Reactions  . Celecoxib Hives and Itching    Face only  . Amiodarone     Ineffective   . Dronedarone Hydrochloride     Edema   . Ezetimibe     Fatigue   . Flecainide Acetate     fatigue  . Sulfonamide Derivatives Rash    "Mouth breaks out"    ROS:  Out of a complete 14 system review of symptoms, the patient complains only of the following symptoms, and all other reviewed systems are negative.  Fatigue Memory loss, confusion Snoring  Blood pressure (!) 157/70, pulse 75, height 5\' 7"  (1.702 m), weight 197 lb (89.4 kg).  Physical Exam  General: The patient is alert and cooperative at the time of the examination.  Eyes: Pupils are equal, round, and reactive to light. Discs are flat bilaterally.  Neck: The neck is supple, no carotid bruits are noted.  Respiratory: The respiratory examination is clear.  Cardiovascular: The cardiovascular examination reveals a regular rate and rhythm, no obvious murmurs or rubs are noted.  Skin: Extremities are without significant edema.  Neurologic Exam  Mental status: The patient is alert and oriented x 2 at the time of the examination (not oriented to date). The Mini-Mental Status Examination done today shows a total score 24/30.  Cranial nerves: Facial symmetry is present. There is good  sensation of the face to pinprick and soft touch bilaterally. The strength of the facial muscles and the muscles to head turning and shoulder shrug are normal bilaterally. Speech is well enunciated, no aphasia or dysarthria is noted. Extraocular movements are full. Visual fields are full. The tongue is midline, and the patient has symmetric elevation of the soft palate. No obvious hearing deficits are noted.  Motor: The motor testing reveals 5 over 5 strength of all 4 extremities. Good symmetric motor tone is noted throughout.  Sensory: Sensory testing is intact to pinprick, soft touch, vibration sensation, and position sense on all 4 extremities. No evidence of extinction is noted.  Coordination: Cerebellar testing  reveals good finger-nose-finger and heel-to-shin bilaterally.  Gait and station: Gait is associated with a shuffling gait, slightly wide-based. Tandem gait is unsteady. Romberg is negative. No drift is seen.  Reflexes: Deep tendon reflexes are symmetric and normal bilaterally. Toes are downgoing bilaterally.   Assessment/Plan:  1. Mild memory disturbance  2. Gait disturbance  The patient has developed a mild memory disturbance over the last 6 months or so. The patient does have risk factors for cerebrovascular disease, we will check a CT scan of the brain. The patient will be placed on low-dose Aricept, she will follow-up in 6 months. We will follow the memory issues over time. I have indicated that the driving needs to be scrutinized closely, if there are concerns for safety, the driving should be terminated.   Jill Alexanders MD 04/04/2016 3:32 PM  [facility] [address] [place], [place] [address]  Phone [phone] Fax [phone]

## 2016-04-04 NOTE — Patient Instructions (Signed)
   We will get a CT of the brain and start Aricept for the memory.   Begin Aricept (donepezil) at 5 mg at night for one month. If this medication is well-tolerated, please call our office and we will call in a prescription for the 10 mg tablets. Look out for side effects that may include nausea, diarrhea, weight loss, or stomach cramps. This medication will also cause a runny nose, therefore there is no need for allergy medications for this purpose.

## 2016-04-12 ENCOUNTER — Telehealth: Payer: Self-pay | Admitting: Neurology

## 2016-04-12 NOTE — Telephone Encounter (Signed)
Called daughter back. Advised their order was sent to [facility]. She can call [phone] to scheduled. They tried to reach them this past Friday to schedule. She verbalized understanding.

## 2016-04-12 NOTE — Telephone Encounter (Signed)
Pt daughter called to inform that they have not heard from anyone re: getting the CT scheduled. Magnus Sinning is asking to be contacted at [phone]

## 2016-04-20 ENCOUNTER — Telehealth: Payer: Self-pay | Admitting: Neurology

## 2016-04-20 ENCOUNTER — Ambulatory Visit
Admission: RE | Admit: 2016-04-20 | Discharge: 2016-04-20 | Disposition: A | Discharge status: Home / Self Care | Payer: Medicare Other | Source: Ambulatory Visit | Attending: Neurology | Admitting: Neurology

## 2016-04-20 DIAGNOSIS — R413 Other amnesia: Secondary | ICD-10-CM

## 2016-04-20 NOTE — Telephone Encounter (Signed)
I called patient. CT the head shows no change from 2013. The patient does appear to have a moderate level of small vessel changes, most prominent in the left parietal white matter. If the patient is not on low-dose aspirin, she should be on aspirin at this time.  CT head 04/20/16:  IMPRESSION:  Slightly abnormal CT scan of the head without contrast showing mild age-appropriate changes of chronic microvascular ischemia and generalized cerebral atrophy. No significant change compared with previous CT scan of the head dated 01/01/2012

## 2016-04-30 ENCOUNTER — Telehealth: Payer: Self-pay | Admitting: Neurology

## 2016-04-30 MED ORDER — DONEPEZIL HCL 10 MG PO TABS: 10.0000 mg | ORAL_TABLET | Freq: Every day | ORAL | 5 refills | Status: DC

## 2016-04-30 NOTE — Telephone Encounter (Signed)
I called the daughter. The prescription for the 10 mg Aricept will be called in. CT scan does show a moderate level small vessel disease, the patient is on Eliquis at this time.

## 2016-04-30 NOTE — Telephone Encounter (Signed)
Pt's daughter called said she needs refill for donepezil (ARICEPT) 5 MG tablet. Pt has not had any side effects from medication and is wanting to know if RX should be increased. She has 1 refill of 5mg  at pharmacy.  She is also wanting to know CT results.  During the day she can be reached at [phone]

## 2016-10-11 ENCOUNTER — Ambulatory Visit (INDEPENDENT_AMBULATORY_CARE_PROVIDER_SITE_OTHER): Payer: Medicare Other | Admitting: Neurology

## 2016-10-11 ENCOUNTER — Encounter (INDEPENDENT_AMBULATORY_CARE_PROVIDER_SITE_OTHER): Payer: Self-pay

## 2016-10-11 ENCOUNTER — Encounter: Payer: Self-pay | Admitting: Neurology

## 2016-10-11 VITALS — BP 190/60 | HR 58 | Ht 67.0 in | Wt 197.5 lb

## 2016-10-11 DIAGNOSIS — R413 Other amnesia: Secondary | ICD-10-CM | POA: Diagnosis not present

## 2016-10-11 MED ORDER — DONEPEZIL HCL 10 MG PO TABS: 10.0000 mg | ORAL_TABLET | Freq: Every day | ORAL | 3 refills | Status: DC

## 2016-10-11 NOTE — Progress Notes (Signed)
Reason for visit: Memory disturbance  Erika [name] is an 83 y.o. female  History of present illness:  Erika [name] is an 83 year old right-handed white female with a history of a progressive memory disturbance and a history of a gait disturbance. The patient will not use a cane or a walker for ambulation. She has not had any falls. 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 She has been placed on Aricept, she is tolerating the 10 mg dose well. She sleeps well at night. She seems to be managing fairly well at this time, no significant change in her memory has been noted over the last 6 months. The patient comes in with her daughter today. The patient has had significant problems with gait instability for several years, she had gait training 2 years ago but none since. The patient is not interested any more physical therapy. She returns to this office for further evaluation.  Past Medical History:  Diagnosis Date  . Abdominal pain   . Anemia   . ANEMIA 06/15/2009   Qualifier: Diagnosis of  By: Selena Batten CMA, Jewel    . Aortic stenosis    moderate AS by 06/2011 echo  . Arthritis   . Atrial fibrillation ([place]) 06/15/2009   Qualifier: Diagnosis of  By: Selena Batten CMA, Jewel    . Cardiac arrhythmia   . Gallstones, probable biliary colic 01/17/1476  . GERD (gastroesophageal reflux disease)   . H/O hiatal hernia   . Hyperlipidemia   . HYPERLIPIDEMIA 06/15/2009   Qualifier: Diagnosis of  By: Selena Batten CMA, Jewel    . Hypertension   . HYPERTENSION 06/15/2009   Qualifier: Diagnosis of  By: Selena Batten CMA, Jewel    . Hyperthyroidism   . Memory disorder 04/04/2016  . Obesity   . OBESITY 06/15/2009   Qualifier: Diagnosis of  By: Selena Batten CMA, Jewel    . Seasonal allergies   . SLEEP APNEA 06/16/2009   Qualifier: Diagnosis of  By: Rayann Heman, MD, Jeneen Rinks    . Unspecified hypothyroidism 06/15/2009   Qualifier: Diagnosis of  By: Selena Batten CMA, Jewel      Past Surgical History:  Procedure Laterality  Date  . BREAST SURGERY  2012 (June or July)   right  . CHOLECYSTECTOMY  06/28/2011   Procedure: LAPAROSCOPIC CHOLECYSTECTOMY WITH INTRAOPERATIVE CHOLANGIOGRAM;  Surgeon: Merrie Roof, MD;  Location: [facility];  Service: General;  Laterality: N/A;  . pharyngeal repair  [id number]   Dr Constance Holster for post-TEE pharygeal tear w neck abscess I&D  . THYROIDECTOMY  1961   80% removed    Family History  Problem Relation Age of Onset  . Heart disease Mother   . Heart disease Father   . Cancer Maternal Aunt        breast    Social history:  reports that she has never smoked. She has never used smokeless tobacco. She reports that she does not drink alcohol or use drugs.    Allergies  Allergen Reactions  . Celecoxib Hives and Itching    Face only  . Amiodarone     Ineffective   . Dronedarone Hydrochloride     Edema   . Ezetimibe     Fatigue   . Flecainide Acetate     fatigue  . Sulfonamide Derivatives Rash    "Mouth breaks out"    Medications:  Prior to Admission medications   Medication Sig Start Date End Date Taking? Authorizing Provider  amLODipine (NORVASC) 5 MG  tablet See admin instructions. 05/19/15  Yes [provider]  Cyanocobalamin (B-12 IJ) Inject 1,000 mcg as directed every 30 (thirty) days.   Yes [provider]  donepezil (ARICEPT) 10 MG tablet Take 1 tablet (10 mg total) by mouth at bedtime. 04/30/16  Yes Kathrynn Ducking, MD  ELIQUIS 5 MG TABS tablet  05/18/15  Yes [provider]  ergocalciferol (VITAMIN D2) 50000 UNITS capsule Take 50,000 Units by mouth once a week. Take on thursday   Yes [provider]  furosemide (LASIX) 20 MG tablet Take 20 mg by mouth.   Yes [provider]  levothyroxine (SYNTHROID, LEVOTHROID) 175 MCG tablet Take 175 mcg by mouth every morning.    Yes [provider]  lisinopril (PRINIVIL,ZESTRIL) 30 MG tablet Take 30 mg by mouth daily.   Yes [provider]  losartan (COZAAR) 100 MG  tablet Take 100 mg by mouth daily.   Yes [provider]  mirabegron ER (MYRBETRIQ) 25 MG TB24 tablet Take 25 mg by mouth daily.   Yes [provider]  omega-3 acid ethyl esters (LOVAZA) 1 G capsule Take 1 g by mouth 2 (two) times daily.    Yes [provider]  omeprazole (PRILOSEC) 20 MG capsule  05/23/15  Yes [provider]    ROS:  Out of a complete 14 system review of symptoms, the patient complains only of the following symptoms, and all other reviewed systems are negative.  Memory disturbance  Blood pressure (!) 190/60, pulse (!) 58, height 5\' 7"  (1.702 m), weight 197 lb 8 oz (89.6 kg).  Physical Exam  General: The patient is alert and cooperative at the time of the examination.  Skin: No significant peripheral edema is noted.   Neurologic Exam  Mental status: The patient is alert and oriented x 3 at the time of the examination. The Mini-Mental Status Examination done today shows a total score 25/30.   Cranial nerves: Facial symmetry is present. Speech is normal, no aphasia or dysarthria is noted. Extraocular movements are full. Visual fields are full.  Motor: The patient has good strength in all 4 extremities.  Sensory examination: Soft touch sensation is symmetric on the face, arms, and legs.  Coordination: The patient has good finger-nose-finger and heel-to-shin bilaterally.  Gait and station: The patient has a slightly wide-based, unsteady gait. Tandem gait was not attempted. Romberg is negative. No drift is seen.  Reflexes: Deep tendon reflexes are symmetric.   Assessment/Plan:  1. Memory disturbance  2. Gait disturbance  The patient has not wished to undergo physical therapy for gait training. I have recommended that she use a cane when she is outside the house. The patient is on Aricept, she is tolerating the medication well, I have sent in a prescription for this. The patient will follow-up in 6 months.   Jill Alexanders  MD 10/11/2016 2:22 PM  [facility] [address] [place], [place] [address]  Phone [phone] Fax [phone]

## 2017-04-11 ENCOUNTER — Encounter: Payer: Self-pay | Admitting: Adult Health

## 2017-04-11 ENCOUNTER — Ambulatory Visit (INDEPENDENT_AMBULATORY_CARE_PROVIDER_SITE_OTHER): Payer: Medicare Other | Admitting: Adult Health

## 2017-04-11 VITALS — BP 128/62 | HR 68 | Ht 67.0 in | Wt 198.4 lb

## 2017-04-11 DIAGNOSIS — R413 Other amnesia: Secondary | ICD-10-CM

## 2017-04-11 MED ORDER — DONEPEZIL HCL 10 MG PO TABS: 5.0000 mg | ORAL_TABLET | Freq: Every day | ORAL | 3 refills | Status: DC

## 2017-04-11 MED ORDER — MEMANTINE HCL 28 X 5 MG & 21 X 10 MG PO TABS: ORAL_TABLET | ORAL | 0 refills | Status: DC

## 2017-04-11 NOTE — Progress Notes (Signed)
I have read the note, and I agree with the clinical assessment and plan.  Charles K Willis   

## 2017-04-11 NOTE — Patient Instructions (Signed)
Your Plan:  Decrease Aricept to 5 mg at bedtime to see if Diarrhea improves Start Namenda titration pack. At the beginning of week 4 call for new prescription If your symptoms worsen or you develop new symptoms please let us know.   Thank you for coming to see [name] at [facility]. I hope we have been able to provide you high quality care today.  You may receive a patient satisfaction survey over the next few weeks. We would appreciate your feedback and comments so that we may continue to improve ourselves and the health of our patients. Memantine Tablets What is this medicine? MEMANTINE (MEM an teen) is used to treat dementia caused by Alzheimer's disease. This medicine may be used for other purposes; ask your health care provider or pharmacist if you have questions. COMMON BRAND NAME(S): Namenda What should I tell my health care provider before I take this medicine? They need to know if you have any of these conditions: -difficulty passing urine -kidney disease -liver disease -seizures -an unusual or allergic reaction to memantine, other medicines, foods, dyes, or preservatives -pregnant or trying to get pregnant -breast-feeding How should I use this medicine? Take this medicine by mouth with a glass of water. Follow the directions on the prescription label. You may take this medicine with or without food. Take your doses at regular intervals. Do not take your medicine more often than directed. Continue to take your medicine even if you feel better. Do not stop taking except on the advice of your doctor or health care professional. Talk to your pediatrician regarding the use of this medicine in children. Special care may be needed. Overdosage: If you think you have taken too much of this medicine contact a poison control center or emergency room at once. NOTE: This medicine is only for you. Do not share this medicine with others. What if I miss a dose? If you miss a  dose, take it as soon as you can. If it is almost time for your next dose, take only that dose. Do not take double or extra doses. If you do not take your medicine for several days, contact your health care provider. Your dose may need to be changed. What may interact with this medicine? -acetazolamide -amantadine -cimetidine -dextromethorphan -dofetilide -hydrochlorothiazide -ketamine -metformin -methazolamide -quinidine -ranitidine -sodium bicarbonate -triamterene This list may not describe all possible interactions. Give your health care provider a list of all the medicines, herbs, non-prescription drugs, or dietary supplements you use. Also tell them if you smoke, drink alcohol, or use illegal drugs. Some items may interact with your medicine. What should I watch for while using this medicine? Visit your doctor or health care professional for regular checks on your progress. Check with your doctor or health care professional if there is no improvement in your symptoms or if they get worse. You may get drowsy or dizzy. Do not drive, use machinery, or do anything that needs mental alertness until you know how this drug affects you. Do not stand or sit up quickly, especially if you are an older patient. This reduces the risk of dizzy or fainting spells. Alcohol can make you more drowsy and dizzy. Avoid alcoholic drinks. What side effects may I notice from receiving this medicine? Side effects that you should report to your doctor or health care professional as soon as possible: -allergic reactions like skin rash, itching or hives, swelling of the face, lips, or tongue -agitation or a feeling of restlessness -depressed  mood -dizziness -hallucinations -redness, blistering, peeling or loosening of the skin, including inside the mouth -seizures -vomiting Side effects that usually do not require medical attention (report to your doctor or health care professional if they continue or are  bothersome): -constipation -diarrhea -headache -nausea -trouble sleeping This list may not describe all possible side effects. Call your doctor for medical advice about side effects. You may report side effects to FDA at 1-800-FDA-1088. Where should I keep my medicine? Keep out of the reach of children. Store at room temperature between 15 degrees and 30 degrees C (59 degrees and 86 degrees F). Throw away any unused medicine after the expiration date. NOTE: This sheet is a summary. It may not cover all possible information. If you have questions about this medicine, talk to your doctor, pharmacist, or health care provider.  2018 Elsevier/Gold Standard (2012-10-13 14:10:42)

## 2017-04-11 NOTE — Progress Notes (Signed)
PATIENT: Erika [name] DOB: 1932-11-19  REASON FOR VISIT: follow up HISTORY FROM: patient  HISTORY OF PRESENT ILLNESS: Today 04/11/17 Erika [name] is an 84 year old female with a history of progressive memory disturbance and gait disorder.  She returns today for follow-up.  She feels that her memory has remained stable.  She lives at home with her husband.  She is able to complete all ADLs independently.  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  Her daughter helps her with her finances.  She denies any trouble sleeping.  Reports occasionally she has trouble falling asleep.  Denies hallucinations.  No significant change in mood or behavior.  She remains on Aricept 10 mg at bedtime.  Daughter does note that there is some issues with diarrhea on this medication.  She returns today for an evaluation.  HISTORY 10/11/16: Erika [name] is an 84 year old right-handed white female with a history of a progressive memory disturbance and a history of a gait disturbance. The patient will not use a cane or a walker for ambulation. She has not had any falls. 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 She has been placed on Aricept, she is tolerating the 10 mg dose well. She sleeps well at night. She seems to be managing fairly well at this time, no significant change in her memory has been noted over the last 6 months. The patient comes in with her daughter today. The patient has had significant problems with gait instability for several years, she had gait training 2 years ago but none since. The patient is not interested any more physical therapy. She returns to this office for further evaluation.   REVIEW OF SYSTEMS: Out of a complete 14 system review of symptoms, the patient complains only of the following symptoms, and all other reviewed systems are negative.  See HPI  ALLERGIES: Allergies  Allergen Reactions  . Celecoxib Hives and  Itching    Face only  . Amiodarone     Ineffective   . Dronedarone Hydrochloride     Edema   . Ezetimibe     Fatigue   . Flecainide Acetate     fatigue  . Sulfonamide Derivatives Rash    "Mouth breaks out"    HOME MEDICATIONS: Outpatient Medications Prior to Visit  Medication Sig Dispense Refill  . amLODipine (NORVASC) 5 MG tablet See admin instructions.  5  . Cyanocobalamin (B-12 IJ) Inject 1,000 mcg as directed every 30 (thirty) days.    Marland Kitchen donepezil (ARICEPT) 10 MG tablet Take 1 tablet (10 mg total) by mouth at bedtime. 90 tablet 3  . ELIQUIS 5 MG TABS tablet     . ergocalciferol (VITAMIN D2) 50000 UNITS capsule Take 50,000 Units by mouth once a week. Take on thursday    . escitalopram (LEXAPRO) 10 MG tablet TAKE 1 TABLET BY MOUTH DAILY TO HELP DEPRESSION  3  . furosemide (LASIX) 20 MG tablet Take 20 mg by mouth.    . levothyroxine (SYNTHROID, LEVOTHROID) 175 MCG tablet Take 175 mcg by mouth every morning.     Marland Kitchen lisinopril (PRINIVIL,ZESTRIL) 40 MG tablet 40 mg daily.    Marland Kitchen omega-3 acid ethyl esters (LOVAZA) 1 G capsule Take 1 g by mouth 2 (two) times daily.     Marland Kitchen omeprazole (PRILOSEC) 20 MG capsule     . lisinopril (PRINIVIL,ZESTRIL) 30 MG tablet Take 30 mg by mouth daily.    Marland Kitchen  losartan (COZAAR) 100 MG tablet Take 100 mg by mouth daily.    . mirabegron ER (MYRBETRIQ) 25 MG TB24 tablet Take 25 mg by mouth daily.     No facility-administered medications prior to visit.     PAST MEDICAL HISTORY: Past Medical History:  Diagnosis Date  . Abdominal pain   . Anemia   . ANEMIA 06/15/2009   Qualifier: Diagnosis of  By: Selena Batten CMA, Jewel    . Aortic stenosis    moderate AS by 06/2011 echo  . Arthritis   . Atrial fibrillation ([place]) 06/15/2009   Qualifier: Diagnosis of  By: Selena Batten CMA, Jewel    . Cardiac arrhythmia   . Gallstones, probable biliary colic 03/12/1935  . GERD (gastroesophageal reflux disease)   . H/O hiatal hernia   . Hyperlipidemia   . HYPERLIPIDEMIA 06/15/2009    Qualifier: Diagnosis of  By: Selena Batten CMA, Jewel    . Hypertension   . HYPERTENSION 06/15/2009   Qualifier: Diagnosis of  By: Selena Batten CMA, Jewel    . Hyperthyroidism   . Memory disorder 04/04/2016  . Obesity   . OBESITY 06/15/2009   Qualifier: Diagnosis of  By: Selena Batten CMA, Jewel    . Seasonal allergies   . SLEEP APNEA 06/16/2009   Qualifier: Diagnosis of  By: Rayann Heman, MD, Jeneen Rinks    . Unspecified hypothyroidism 06/15/2009   Qualifier: Diagnosis of  By: Selena Batten CMA, Jewel      PAST SURGICAL HISTORY: Past Surgical History:  Procedure Laterality Date  . BREAST SURGERY  2012 (June or July)   right  . CHOLECYSTECTOMY  06/28/2011   Procedure: LAPAROSCOPIC CHOLECYSTECTOMY WITH INTRAOPERATIVE CHOLANGIOGRAM;  Surgeon: Merrie Roof, MD;  Location: [facility];  Service: General;  Laterality: N/A;  . pharyngeal repair  [id number]   Dr Constance Holster for post-TEE pharygeal tear w neck abscess I&D  . THYROIDECTOMY  1961   80% removed    FAMILY HISTORY: Family History  Problem Relation Age of Onset  . Heart disease Mother   . Heart disease Father   . Cancer Maternal Aunt        breast    SOCIAL HISTORY: Social History   Socioeconomic History  . Marital status: Married    Spouse name: Jeneen Rinks  . Number of children: 2  . Years of education: 37  . Highest education level: Not on file  Occupational History  . Occupation: Retired  Scientific laboratory technician  . Financial resource strain: Not on file  . Food insecurity:    Worry: Not on file    Inability: Not on file  . Transportation needs:    Medical: Not on file    Non-medical: Not on file  Tobacco Use  . Smoking status: Never Smoker  . Smokeless tobacco: Never Used  Substance and Sexual Activity  . Alcohol use: No  . Drug use: No  . Sexual activity: Yes    Birth control/protection: Post-menopausal  Lifestyle  . Physical activity:    Days per week: Not on file    Minutes per session: Not on file  . Stress: Not on file  Relationships  . Social connections:     Talks on phone: Not on file    Gets together: Not on file    Attends religious service: Not on file    Active member of club or organization: Not on file    Attends meetings of clubs or organizations: Not on file    Relationship status: Not on file  . Intimate partner  violence:    Fear of current or ex partner: Not on file    Emotionally abused: Not on file    Physically abused: Not on file    Forced sexual activity: Not on file  Other Topics Concern  . Not on file  Social History Narrative   Lives with husband in [place], [place].   Independent in activities of daily living. Still drives.   Caffeine use: Coffee and Mt Dew daily   Right-handed      PHYSICAL EXAM  Vitals:   04/11/17 1439  BP: 128/62  Pulse: 68  Weight: 198 lb 6.4 oz (90 kg)  Height: 5\' 7"  (1.702 m)   Body mass index is 31.07 kg/m.   MMSE - Mini Mental State Exam 04/11/2017 10/11/2016 04/04/2016  Orientation to time 2 4 4   Orientation to Place 4 4 4   Registration 3 3 3   Attention/ Calculation 2 5 5   Recall 1 0 0  Language- name 2 objects 2 2 2   Language- repeat 1 1 1   Language- follow 3 step command 3 3 3   Language- read & follow direction 1 1 1   Write a sentence 1 1 1   Copy design 0 1 0  Total score 20 25 24      Generalized: Well developed, in no acute distress   Neurological examination  Mentation: Alert. Follows all commands speech and language fluent Cranial nerve II-XII: Pupils were equal round reactive to light. Extraocular movements were full, visual field were full on confrontational test. Facial sensation and strength were normal. Uvula tongue midline. Head turning and shoulder shrug  were normal and symmetric. Motor: The motor testing reveals 5 over 5 strength of all 4 extremities. Good symmetric motor tone is noted throughout.  Sensory: Sensory testing is intact to soft touch on all 4 extremities. No evidence of extinction is noted.  Coordination: Cerebellar testing reveals good  finger-nose-finger and heel-to-shin bilaterally.  Gait and station: Gait is slightly unsteady.  Tandem gait not attempted. Reflexes: Deep tendon reflexes are symmetric and normal bilaterally.   DIAGNOSTIC DATA (LABS, IMAGING, TESTING) - I reviewed patient records, labs, notes, testing and imaging myself where available.  Lab Results  Component Value Date   WBC 9.8 01/01/2012   HGB 11.8 (L) 01/01/2012   HCT 34.8 (L) 01/01/2012   MCV 85.1 01/01/2012   PLT 225 01/01/2012      Component Value Date/Time   NA 127 (L) 01/01/2012 2140   K 4.2 01/01/2012 2140   CL 89 (L) 01/01/2012 2140   CO2 27 01/01/2012 2140   GLUCOSE 103 (H) 01/01/2012 2140   BUN 19 01/01/2012 2140   CREATININE 1.02 01/01/2012 2140   CALCIUM 9.7 01/01/2012 2140   PROT 7.0 01/01/2012 2140   ALBUMIN 3.7 01/01/2012 2140   AST 18 01/01/2012 2140   ALT 13 01/01/2012 2140   ALKPHOS 83 01/01/2012 2140   BILITOT 0.2 (L) 01/01/2012 2140   GFRNONAA 52 (L) 01/01/2012 2140   GFRAA 60 (L) 01/01/2012 2140   Lab Results  Component Value Date   CHOL 303 (H) 06/23/2011   HDL 52 06/23/2011   LDLCALC 239 (H) 06/23/2011   TRIG 61 06/23/2011   CHOLHDL 5.8 06/23/2011   Lab Results  Component Value Date   HGBA1C 5.6 06/23/2011   No results found for: [id number] Lab Results  Component Value Date   TSH 0.285 (L) 06/23/2011      ASSESSMENT AND PLAN 83 y.o. year old female  has a past medical  history of Abdominal pain, Anemia, ANEMIA (06/15/2009), Aortic stenosis, Arthritis, Atrial fibrillation ([place]) (06/15/2009), Cardiac arrhythmia, Gallstones, probable biliary colic (08/15/7577), GERD (gastroesophageal reflux disease), H/O hiatal hernia, Hyperlipidemia, HYPERLIPIDEMIA (06/15/2009), Hypertension, HYPERTENSION (06/15/2009), Hyperthyroidism, Memory disorder (04/04/2016), Obesity, OBESITY (06/15/2009), Seasonal allergies, SLEEP APNEA (06/16/2009), and Unspecified hypothyroidism (06/15/2009). here with :  1.  Memory disturbance  The patient's  memory score has declined slightly.  I have suggested that we start [place].  I have reviewed potential side effects of Namenda and provided the patient and her daughter with a handout.  She will be given a Namenda titration pack.  If she tolerates this well she will call for maintenance dose at the end of 4 weeks.  The patient will decrease Aricept to 5 mg at bedtime to see if this improves GI upset.  Advised that if her symptoms worsen or she develops new symptoms she should let us know.  She will follow-up in 6 months or sooner if needed.   Ward Givens, MSN, NP-C 04/11/2017, 2:58 PM [facility] [address] [place], [place] [address] [phone]  I spent 15 minutes with the patient. 50% of this time was spent discussing memory score

## 2017-05-06 ENCOUNTER — Telehealth: Payer: Self-pay | Admitting: Adult Health

## 2017-05-06 NOTE — Telephone Encounter (Signed)
Pt daughter's called to advise the pt will not eat, no desire to do anything, sleeps a lot since starting memantine (NAMENDA TITRATION PAK) tablet pack. These symptoms became noticeable over the past 2 weeks. This week starts the 4th week of 10mg  am and pm. The pt sayshe is having some aches, said she don't feel good. Please call to advise

## 2017-05-06 NOTE — Telephone Encounter (Signed)
LVM for daughter, Ms Kenton Kingfisher on [place] advising her this RN has information regarding the patient's medication. Left number, requested she call tomorrow.

## 2017-05-06 NOTE — Telephone Encounter (Signed)
She should decrease her dose to 5 mg twice a day.  If her symptoms improve she can continue at this dose.  However if her symptoms do not improve within 1 week she should discontinue the medication.  If she is still having symptoms she should see her primary care provider.

## 2017-05-07 NOTE — Telephone Encounter (Signed)
Received call back from daughter, Erika [name]. Advised her to cut back on Namenda to 5 mg twice daily for a week. If patient's symptoms are not better, she should stop the medication.  If no better, she should see her PCP. Erika [name] stated she saw PCP last Thurs for a B12 shot. PCP stated her back issue may be muscular and to wait another week to see if it improves (daufhter had not mentioned this in earlier call). Daughter stated  She will cut back dose of Namenda, continue Aricept 5 mg daily, call back in 1-2 weeks with update. She verbalized understanding, appreciation.

## 2017-10-18 MED ORDER — AMLODIPINE BESYLATE 5 MG PO TABS: 5.0000 mg | ORAL_TABLET | ORAL | 0 refills | Status: DC

## 2017-10-18 NOTE — Addendum Note (Signed)
Addended by: Ashok Norris on: 10/18/2017 05:42 PM   Modules accepted: Orders

## 2017-10-18 NOTE — Telephone Encounter (Signed)
Dr. Wynonia Lawman patient. Per Dr. Agustin Cree amlodipine refilled 5 mg daily.

## 2017-10-22 ENCOUNTER — Other Ambulatory Visit: Payer: Self-pay

## 2017-10-25 ENCOUNTER — Telehealth: Payer: Self-pay | Admitting: Cardiology

## 2017-10-25 MED ORDER — AMLODIPINE BESYLATE 5 MG PO TABS: 5.0000 mg | ORAL_TABLET | Freq: Every day | ORAL | 0 refills | Status: DC

## 2017-10-25 NOTE — Telephone Encounter (Signed)
Patient needs a refill on her amlodipine 5mg  tablets sent to [facility] on [address] [employer].  She is out of her medication.

## 2017-10-25 NOTE — Telephone Encounter (Signed)
30 day supply of amlodipine 5 mg sent to [facility] in [place] [address]. Patient needs a follow up appointment scheduled in November. Will notify the front desk to call patient and schedule an appointment.

## 2017-10-25 NOTE — Telephone Encounter (Signed)
Patient scheduled with Dr. Harrell Gave on 11/8 per daughter. Per daughter patient has dementia

## 2017-10-30 ENCOUNTER — Ambulatory Visit: Payer: Medicare Other | Admitting: Cardiology

## 2017-11-14 ENCOUNTER — Ambulatory Visit (INDEPENDENT_AMBULATORY_CARE_PROVIDER_SITE_OTHER): Payer: Medicare Other | Admitting: Adult Health

## 2017-11-14 ENCOUNTER — Encounter: Payer: Self-pay | Admitting: Adult Health

## 2017-11-14 VITALS — BP 130/62 | HR 64 | Ht 67.0 in | Wt 196.0 lb

## 2017-11-14 DIAGNOSIS — R413 Other amnesia: Secondary | ICD-10-CM

## 2017-11-14 DIAGNOSIS — R269 Unspecified abnormalities of gait and mobility: Secondary | ICD-10-CM | POA: Diagnosis not present

## 2017-11-14 NOTE — Progress Notes (Signed)
PATIENT: Erika [name] DOB: September 17, 1932  REASON FOR VISIT: follow up HISTORY FROM: patient  HISTORY OF PRESENT ILLNESS: Today 11/14/17: Erika [name] is an 84 year old female with a history of memory disturbance.  She returns today for follow-up.  She is here with her daughter.  She feels that her memory has remained stable.  She lives at home with her husband.  She is able to complete all ADLs independently.  Her daughter reports that she does need some prompting with certain things.  Her daughter helps her with her finances.  Patient denies any trouble sleeping.  Denies any changes with her mood or behavior.  No hallucinations.  She does feel that her appetite has decreased.  Daughter states that she typically will take several bites of food and then states she is full.  She is currently taking Aricept 5 mg at bedtime.  They report decrease in Aricept did not change the loose stools.  She was unable to tolerate Namenda as it made her too drowsy.  Patient's gait is unsteady.  Daughter reports that she had a fall several months ago.  States that she was going up steps and fell.  Fortunately she only had bruising.  She returns today for evaluation.  HISTORY  04/11/17 Erika [name] is an 84 year old female with a history of progressive memory disturbance and gait disorder.  She returns today for follow-up.  She feels that her memory has remained stable.  She lives at home with her husband.  She is able to complete all ADLs independently.  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  Her daughter helps her with her finances.  She denies any trouble sleeping.  Reports occasionally she has trouble falling asleep.  Denies hallucinations.  No significant change in mood or behavior.  She remains on Aricept 10 mg at bedtime.  Daughter does note that there is some issues with diarrhea on this medication.  She returns today for an evaluation.  REVIEW OF SYSTEMS: Out of a complete 14  system review of symptoms, the patient complains only of the following symptoms, and all other reviewed systems are negative.  Diarrhea, incontinence of bowel, snoring, back pain, aching muscles, muscle cramps, walking difficulty, confusion, memory loss, eye discharge  ALLERGIES: Allergies  Allergen Reactions  . Celecoxib Hives and Itching    Face only  . Amiodarone     Ineffective   . Dronedarone Hydrochloride     Edema   . Ezetimibe     Fatigue   . Flecainide Acetate     fatigue  . Namenda [Memantine Hcl] Other (See Comments)    Slept alot, felt terrible  . Sulfonamide Derivatives Rash    "Mouth breaks out"    HOME MEDICATIONS: Outpatient Medications Prior to Visit  Medication Sig Dispense Refill  . amLODipine (NORVASC) 5 MG tablet Take 1 tablet (5 mg total) by mouth daily. 30 tablet 0  . donepezil (ARICEPT) 10 MG tablet Take 0.5 tablets (5 mg total) by mouth at bedtime. 90 tablet 3  . ELIQUIS 5 MG TABS tablet     . ergocalciferol (VITAMIN D2) 50000 UNITS capsule Take 50,000 Units by mouth once a week. Take on thursday    . escitalopram (LEXAPRO) 10 MG tablet TAKE 1 TABLET BY MOUTH DAILY TO HELP DEPRESSION  3  . levothyroxine (SYNTHROID, LEVOTHROID) 175 MCG tablet Take 175 mcg by mouth every morning.     Marland Kitchen lisinopril (PRINIVIL,ZESTRIL) 40 MG tablet  40 mg daily.    Marland Kitchen omega-3 acid ethyl esters (LOVAZA) 1 G capsule Take 1 g by mouth 2 (two) times daily.     Marland Kitchen omeprazole (PRILOSEC) 20 MG capsule     . Cyanocobalamin (B-12 IJ) Inject 1,000 mcg as directed every 30 (thirty) days.    . furosemide (LASIX) 20 MG tablet Take 20 mg by mouth.    . memantine (NAMENDA TITRATION PAK) tablet pack 5 mg/day for =1 week; 5 mg twice daily for =1 week; 15 mg/day given in 5 mg and 10 mg separated doses for =1 week; then 10 mg twice daily (Patient not taking: Reported on 11/14/2017) 49 tablet 0   No facility-administered medications prior to visit.     PAST MEDICAL HISTORY: Past Medical  History:  Diagnosis Date  . Abdominal pain   . Anemia   . ANEMIA 06/15/2009   Qualifier: Diagnosis of  By: Selena Batten CMA, Jewel    . Aortic stenosis    moderate AS by 06/2011 echo  . Arthritis   . Atrial fibrillation ([place]) 06/15/2009   Qualifier: Diagnosis of  By: Selena Batten CMA, Jewel    . Cardiac arrhythmia   . Gallstones, probable biliary colic 07/15/2421  . GERD (gastroesophageal reflux disease)   . H/O hiatal hernia   . Hyperlipidemia   . HYPERLIPIDEMIA 06/15/2009   Qualifier: Diagnosis of  By: Selena Batten CMA, Jewel    . Hypertension   . HYPERTENSION 06/15/2009   Qualifier: Diagnosis of  By: Selena Batten CMA, Jewel    . Hyperthyroidism   . Memory disorder 04/04/2016  . Obesity   . OBESITY 06/15/2009   Qualifier: Diagnosis of  By: Selena Batten CMA, Jewel    . Seasonal allergies   . SLEEP APNEA 06/16/2009   Qualifier: Diagnosis of  By: Rayann Heman, MD, Jeneen Rinks    . Unspecified hypothyroidism 06/15/2009   Qualifier: Diagnosis of  By: Selena Batten CMA, Jewel      PAST SURGICAL HISTORY: Past Surgical History:  Procedure Laterality Date  . BREAST SURGERY  2012 (June or July)   right  . CHOLECYSTECTOMY  06/28/2011   Procedure: LAPAROSCOPIC CHOLECYSTECTOMY WITH INTRAOPERATIVE CHOLANGIOGRAM;  Surgeon: Merrie Roof, MD;  Location: [facility];  Service: General;  Laterality: N/A;  . pharyngeal repair  [id number]   Dr Constance Holster for post-TEE pharygeal tear w neck abscess I&D  . THYROIDECTOMY  1961   80% removed    FAMILY HISTORY: Family History  Problem Relation Age of Onset  . Heart disease Mother   . Heart disease Father   . Cancer Maternal Aunt        breast    SOCIAL HISTORY: Social History   Socioeconomic History  . Marital status: Married    Spouse name: Jeneen Rinks  . Number of children: 2  . Years of education: 37  . Highest education level: Not on file  Occupational History  . Occupation: Retired  Scientific laboratory technician  . Financial resource strain: Not on file  . Food insecurity:    Worry: Not on file    Inability: Not on file    . Transportation needs:    Medical: Not on file    Non-medical: Not on file  Tobacco Use  . Smoking status: Never Smoker  . Smokeless tobacco: Never Used  Substance and Sexual Activity  . Alcohol use: No  . Drug use: No  . Sexual activity: Yes    Birth control/protection: Post-menopausal  Lifestyle  . Physical activity:    Days per week:  Not on file    Minutes per session: Not on file  . Stress: Not on file  Relationships  . Social connections:    Talks on phone: Not on file    Gets together: Not on file    Attends religious service: Not on file    Active member of club or organization: Not on file    Attends meetings of clubs or organizations: Not on file    Relationship status: Not on file  . Intimate partner violence:    Fear of current or ex partner: Not on file    Emotionally abused: Not on file    Physically abused: Not on file    Forced sexual activity: Not on file  Other Topics Concern  . Not on file  Social History Narrative   Lives with husband in [place], [place].   Independent in activities of daily living. Still drives.   Caffeine use: Coffee and Mt Dew daily   Right-handed      PHYSICAL EXAM  Vitals:   11/14/17 1442  BP: 130/62  Pulse: 64  Weight: 196 lb (88.9 kg)  Height: 5\' 7"  (1.702 m)   Body mass index is 30.7 kg/m.   MMSE - Mini Mental State Exam 11/14/2017 04/11/2017 10/11/2016  Orientation to time 4 2 4   Orientation to Place 4 4 4   Registration 3 3 3   Attention/ Calculation 3 2 5   Recall 1 1 0  Language- name 2 objects 2 2 2   Language- repeat 1 1 1   Language- follow 3 step command 3 3 3   Language- read & follow direction 1 1 1   Write a sentence 1 1 1   Copy design 0 0 1  Total score 23 20 25      Generalized: Well developed, in no acute distress   Neurological examination  Mentation: Alert oriented to time, place, history taking. Follows all commands speech and language fluent Cranial nerve II-XII: Pupils were equal round reactive to  light. Extraocular movements were full, visual field were full on confrontational test. Facial sensation and strength were normal. Uvula tongue midline. Head turning and shoulder shrug  were normal and symmetric. Motor: The motor testing reveals 5 over 5 strength of all 4 extremities. Good symmetric motor tone is noted throughout.  Sensory: Sensory testing is intact to soft touch on all 4 extremities. No evidence of extinction is noted.  Coordination: Cerebellar testing reveals good finger-nose-finger and heel-to-shin bilaterally.  Gait and station: Gait is unsteady.  Tandem gait not attempted.  Romberg is negative. Reflexes: Deep tendon reflexes are symmetric and normal bilaterally.   DIAGNOSTIC DATA (LABS, IMAGING, TESTING) - I reviewed patient records, labs, notes, testing and imaging myself where available.  Lab Results  Component Value Date   WBC 9.8 01/01/2012   HGB 11.8 (L) 01/01/2012   HCT 34.8 (L) 01/01/2012   MCV 85.1 01/01/2012   PLT 225 01/01/2012      Component Value Date/Time   NA 127 (L) 01/01/2012 2140   K 4.2 01/01/2012 2140   CL 89 (L) 01/01/2012 2140   CO2 27 01/01/2012 2140   GLUCOSE 103 (H) 01/01/2012 2140   BUN 19 01/01/2012 2140   CREATININE 1.02 01/01/2012 2140   CALCIUM 9.7 01/01/2012 2140   PROT 7.0 01/01/2012 2140   ALBUMIN 3.7 01/01/2012 2140   AST 18 01/01/2012 2140   ALT 13 01/01/2012 2140   ALKPHOS 83 01/01/2012 2140   BILITOT 0.2 (L) 01/01/2012 2140   GFRNONAA 52 (L) 01/01/2012  2140   GFRAA 60 (L) 01/01/2012 2140   Lab Results  Component Value Date   CHOL 303 (H) 06/23/2011   HDL 52 06/23/2011   LDLCALC 239 (H) 06/23/2011   TRIG 61 06/23/2011   CHOLHDL 5.8 06/23/2011   Lab Results  Component Value Date   HGBA1C 5.6 06/23/2011   No results found for: VITAMINB12 Lab Results  Component Value Date   TSH 0.285 (L) 06/23/2011      ASSESSMENT AND PLAN 84 y.o. year old female  has a past medical history of Abdominal pain, Anemia,  ANEMIA (06/15/2009), Aortic stenosis, Arthritis, Atrial fibrillation ([facility]) (06/15/2009), Cardiac arrhythmia, Gallstones, probable biliary colic (05/16/5927), GERD (gastroesophageal reflux disease), H/O hiatal hernia, Hyperlipidemia, HYPERLIPIDEMIA (06/15/2009), Hypertension, HYPERTENSION (06/15/2009), Hyperthyroidism, Memory disorder (04/04/2016), Obesity, OBESITY (06/15/2009), Seasonal allergies, SLEEP APNEA (06/16/2009), and Unspecified hypothyroidism (06/15/2009). here with:  1.  Memory disturbance 2.  Abnormality of gait and balance  The patient memory score has remained relatively stable.  She will do a trial off of Aricept to see if loose stools resolved.  She (patient or daughter) will call in 1 week with an update.  The patient will be sent to physical therapy for gait and balance training.  I have advised that if her symptoms worsen or she develops new symptoms she should let us know.  She will follow-up in 6 months or sooner if needed.   Ward Givens, MSN, NP-C 11/14/2017, 3:10 PM [facility] [address], [place], [place] [address] [phone]

## 2017-11-14 NOTE — Patient Instructions (Signed)
Your Plan:  Stop Aricept for 1 week then call with update on bowels PT ordered Memory score is stable If your symptoms worsen or you develop new symptoms please let us know.   Thank you for coming to see [name] at [facility]. I hope we have been able to provide you high quality care today.  You may receive a patient satisfaction survey over the next few weeks. We would appreciate your feedback and comments so that we may continue to improve ourselves and the health of our patients.

## 2017-11-14 NOTE — Progress Notes (Signed)
I have read the note, and I agree with the clinical assessment and plan.  Kaelen Caughlin K Mariko Nowakowski   

## 2017-11-15 ENCOUNTER — Ambulatory Visit: Payer: Medicare Other | Admitting: Cardiology

## 2017-11-18 ENCOUNTER — Other Ambulatory Visit: Payer: Self-pay

## 2017-11-20 ENCOUNTER — Other Ambulatory Visit: Payer: Self-pay

## 2017-11-20 ENCOUNTER — Ambulatory Visit: Payer: Medicare Other | Admitting: Cardiology

## 2017-11-20 MED ORDER — AMLODIPINE BESYLATE 5 MG PO TABS: 5.0000 mg | ORAL_TABLET | Freq: Every day | ORAL | 0 refills | Status: DC

## 2017-11-20 NOTE — Telephone Encounter (Signed)
Courtesy refill sent  

## 2017-11-26 ENCOUNTER — Other Ambulatory Visit: Payer: Self-pay

## 2017-11-26 ENCOUNTER — Telehealth: Payer: Self-pay | Admitting: Cardiology

## 2017-11-26 MED ORDER — AMLODIPINE BESYLATE 5 MG PO TABS: 5.0000 mg | ORAL_TABLET | Freq: Every day | ORAL | 0 refills | Status: DC

## 2017-11-26 NOTE — Telephone Encounter (Signed)
° ° ° °  1. Which medications need to be refilled? (please list name of each medication and dose if known) amlodipine 5mg  tablet once daily  2. Which pharmacy/location (including street and city if local pharmacy) is medication to be sent to? [facility] on  [address]  3. Do they need a 30 day or 90 day supply? 10

## 2017-11-26 NOTE — Telephone Encounter (Signed)
Another refill has been sent due to patient moving appointment out since both her and her husband needed to be seen.

## 2017-11-29 ENCOUNTER — Encounter: Payer: Self-pay | Admitting: Cardiology

## 2017-11-29 ENCOUNTER — Ambulatory Visit (INDEPENDENT_AMBULATORY_CARE_PROVIDER_SITE_OTHER): Payer: Medicare Other | Admitting: Cardiology

## 2017-11-29 VITALS — BP 138/62 | HR 56 | Ht 67.0 in | Wt 194.0 lb

## 2017-11-29 DIAGNOSIS — I4821 Permanent atrial fibrillation: Secondary | ICD-10-CM | POA: Diagnosis not present

## 2017-11-29 DIAGNOSIS — Z7189 Other specified counseling: Secondary | ICD-10-CM | POA: Diagnosis not present

## 2017-11-29 DIAGNOSIS — G459 Transient cerebral ischemic attack, unspecified: Secondary | ICD-10-CM | POA: Diagnosis not present

## 2017-11-29 DIAGNOSIS — I10 Essential (primary) hypertension: Secondary | ICD-10-CM | POA: Diagnosis not present

## 2017-11-29 NOTE — Progress Notes (Signed)
Cardiology Office Note:    Date:  11/29/2017   ID:  Erika [name], DOB 01-25-1932, MRN [id number]  PCP:  Tamsen Roers, MD  Cardiologist:  Buford Dresser, MD PhD  Referring MD: Tamsen Roers, MD   CC: follow up  History of Present Illness:    Erika [name] is a 84 y.o. female with a hx of hypertension, permanent atrial fibrillation on anticoagulation with apixaban, hypothyroidism, obesity, hyperlipidemia, TIA who is seen in follow up (Dr. Wynonia Lawman patient) at the request of Tamsen Roers, MD for the evaluation and management of atrial fibrillation.  Per Dr. Thurman Coyer note, had history of esophageal perforation with TEE in 2011. Had cardioversion in 2009 and 2011, cardiolite 2011, echocardiogram 2011 with LVEF 50%.   Overall she reports doing well. Some memory concerns per family. No chest pain, shortness of breath, PND, orthopnea, syncope. Cannot feel her atrial fibrillation. Tolerating anticoagulation without issues.   Past Medical History:  Diagnosis Date  . Abdominal pain   . Anemia   . ANEMIA 06/15/2009   Qualifier: Diagnosis of  By: Selena Batten CMA, Jewel    . Aortic stenosis    moderate AS by 06/2011 echo  . Arthritis   . Atrial fibrillation ([place]) 06/15/2009   Qualifier: Diagnosis of  By: Selena Batten CMA, Jewel    . Cardiac arrhythmia   . Gallstones, probable biliary colic 07/11/812  . GERD (gastroesophageal reflux disease)   . H/O hiatal hernia   . Hyperlipidemia   . HYPERLIPIDEMIA 06/15/2009   Qualifier: Diagnosis of  By: Selena Batten CMA, Jewel    . Hypertension   . HYPERTENSION 06/15/2009   Qualifier: Diagnosis of  By: Selena Batten CMA, Jewel    . Hyperthyroidism   . Memory disorder 04/04/2016  . Obesity   . OBESITY 06/15/2009   Qualifier: Diagnosis of  By: Selena Batten CMA, Jewel    . Seasonal allergies   . SLEEP APNEA 06/16/2009   Qualifier: Diagnosis of  By: Rayann Heman, MD, Jeneen Rinks    . Unspecified hypothyroidism 06/15/2009   Qualifier: Diagnosis of  By: Selena Batten CMA, Jewel      Past Surgical History:    Procedure Laterality Date  . BREAST SURGERY  2012 (June or July)   right  . CHOLECYSTECTOMY  06/28/2011   Procedure: LAPAROSCOPIC CHOLECYSTECTOMY WITH INTRAOPERATIVE CHOLANGIOGRAM;  Surgeon: Merrie Roof, MD;  Location: [facility];  Service: General;  Laterality: N/A;  . pharyngeal repair  [id number]   Dr Constance Holster for post-TEE pharygeal tear w neck abscess I&D  . THYROIDECTOMY  1961   80% removed    Current Medications: Current Outpatient Medications on File Prior to Visit  Medication Sig  . amLODipine (NORVASC) 5 MG tablet Take 1 tablet (5 mg total) by mouth daily.  . Cyanocobalamin (B-12 IJ) Inject 1,000 mcg as directed every 30 (thirty) days.  Marland Kitchen donepezil (ARICEPT) 10 MG tablet Take 0.5 tablets (5 mg total) by mouth at bedtime.  Marland Kitchen ELIQUIS 5 MG TABS tablet   . ergocalciferol (VITAMIN D2) 50000 UNITS capsule Take 50,000 Units by mouth once a week. Take on thursday  . escitalopram (LEXAPRO) 10 MG tablet TAKE 1 TABLET BY MOUTH DAILY TO HELP DEPRESSION  . furosemide (LASIX) 20 MG tablet Take 20 mg by mouth.  . levothyroxine (SYNTHROID, LEVOTHROID) 175 MCG tablet Take 175 mcg by mouth every morning.   Marland Kitchen lisinopril (PRINIVIL,ZESTRIL) 40 MG tablet 40 mg daily.  Marland Kitchen omega-3 acid ethyl esters (LOVAZA) 1 G capsule Take 1 g by mouth 2 (two) times daily.   Marland Kitchen  omeprazole (PRILOSEC) 20 MG capsule    No current facility-administered medications on file prior to visit.      Allergies:   Celecoxib; Amiodarone; Dronedarone hydrochloride; Ezetimibe; Flecainide acetate; Namenda [memantine hcl]; and Sulfonamide derivatives   Social History   Socioeconomic History  . Marital status: Married    Spouse name: Jeneen Rinks  . Number of children: 2  . Years of education: 24  . Highest education level: Not on file  Occupational History  . Occupation: Retired  Scientific laboratory technician  . Financial resource strain: Not on file  . Food insecurity:    Worry: Not on file    Inability: Not on file  . Transportation needs:     Medical: Not on file    Non-medical: Not on file  Tobacco Use  . Smoking status: Never Smoker  . Smokeless tobacco: Never Used  Substance and Sexual Activity  . Alcohol use: No  . Drug use: No  . Sexual activity: Yes    Birth control/protection: Post-menopausal  Lifestyle  . Physical activity:    Days per week: Not on file    Minutes per session: Not on file  . Stress: Not on file  Relationships  . Social connections:    Talks on phone: Not on file    Gets together: Not on file    Attends religious service: Not on file    Active member of club or organization: Not on file    Attends meetings of clubs or organizations: Not on file    Relationship status: Not on file  Other Topics Concern  . Not on file  Social History Narrative   Lives with husband in [place], [place].   Independent in activities of daily living. Still drives.   Caffeine use: Coffee and Mt Dew daily   Right-handed     Family History: The patient's family history includes Cancer in her maternal aunt; Heart disease in her father and mother. Brother with CAD, another brother with CAD and hyperlipidemia, another brother with CAD. Both father and mother deceased, both had CAD. Sister has cancer and CAD.  ROS:   Please see the history of present illness.  Additional pertinent ROS:  Constitutional: Negative for chills, fever, night sweats, unintentional weight loss  HENT: Negative for ear pain and hearing loss.   Eyes: Negative for loss of vision and eye pain.  Respiratory: Negative for cough, sputum, shortness of breath, wheezing.   Cardiovascular: Negative for chest pain, palpitations, PND, orthopnea, lower extremity edema and claudication.  Gastrointestinal: Negative for abdominal pain, melena, and hematochezia.  Genitourinary: Negative for dysuria and hematuria.  Musculoskeletal: Negative for falls and myalgias.  Skin: Negative for itching and rash.  Neurological: Negative for focal weakness, focal sensory  changes and loss of consciousness.  Endo/Heme/Allergies: Does not bruise/bleed easily.    EKGs/Labs/Other Studies Reviewed:    The following studies were reviewed today: Note from Dr. Wynonia Lawman 6 mos ago.  EKG:  EKG is personally reviewed.  The ekg ordered today demonstrates atrial fibrillation with slow ventricular response, heart rate 56  Recent Labs: No results found for requested labs within last 8760 hours.  Recent Lipid Panel    Component Value Date/Time   CHOL 303 (H) 06/23/2011 0635   TRIG 61 06/23/2011 0635   HDL 52 06/23/2011 0635   CHOLHDL 5.8 06/23/2011 0635   VLDL 12 06/23/2011 0635   LDLCALC 239 (H) 06/23/2011 0635    Physical Exam:    VS:  BP 138/62 (BP Location: Left  Arm, Patient Position: Sitting, Cuff Size: Normal)   Pulse (!) 56   Ht 5\' 7"  (1.702 m)   Wt 194 lb (88 kg)   BMI 30.38 kg/m     Wt Readings from Last 3 Encounters:  11/29/17 194 lb (88 kg)  11/14/17 196 lb (88.9 kg)  04/11/17 198 lb 6.4 oz (90 kg)     GEN: Well nourished, well developed in no acute distress HEENT: Normal NECK: No JVD; No carotid bruits LYMPHATICS: No lymphadenopathy CARDIAC: regular rhythm, normal S1 and S2, no murmurs, rubs, gallops. Radial and DP pulses 2+ bilaterally. RESPIRATORY:  Clear to auscultation without rales, wheezing or rhonchi  ABDOMEN: Soft, non-tender, non-distended MUSCULOSKELETAL:  No edema; No deformity  SKIN: Warm and dry NEUROLOGIC:  Alert and oriented x 3 PSYCHIATRIC:  Normal affect   ASSESSMENT:    1. Permanent atrial fibrillation Chronic  2. Essential hypertension   3. TIA (transient ischemic attack)   4. Counseling on health promotion and disease prevention    PLAN:    1. Atrial fibrillation, permanent: history of TIA  -CHA2DS2/VAS Stroke Risk Points  = 6 (HTN, age, TIA, gender)   -tolerating anticoagulation, on apixaban 5 mg BID  -would recheck BMET at next visit to verify no decrease in renal function that would need dose  adjustment  -rate controlled on no AV nodal agents 2. Hypertension: within reasonable goal today given age and comborbidities -on amlodipine 5 mg, furosemide 20 mg, lisinopril 40 mg daily -check BMET next visit  Prevention counseling: -recommend heart healthy/Mediterranean diet, with whole grains, fruits, vegetable, fish, lean meats, nuts, and olive oil. Limit salt. -recommend moderate walking, 3-5 times/week for 30-50 minutes each session. Aim for at least 150 minutes.week. Goal should be pace of 3 miles/hours, or walking 1.5 miles in 30 minutes -recommend avoidance of tobacco products. Avoid excess alcohol.  Plan for follow up: 6 mos  Medication Adjustments/Labs and Tests Ordered: Current medicines are reviewed at length with the patient today.  Concerns regarding medicines are outlined above.  Orders Placed This Encounter  Procedures  . EKG 12-Lead   No orders of the defined types were placed in this encounter.   Patient Instructions  Medication Instructions:  NONE  If you need a refill on your cardiac medications before your next appointment, please call your pharmacy.   Lab work: NONE  If you have labs (blood work) drawn today and your tests are completely normal, you will receive your results only by: Marland Kitchen MyChart Message (if you have MyChart) OR . A paper copy in the mail If you have any lab test that is abnormal or we need to change your treatment, we will call you to review the results.  Testing/Procedures: NONE   Follow-Up: At [facility], you and your health needs are our priority.  As part of our continuing mission to provide you with exceptional heart care, we have created designated Provider Care Teams.  These Care Teams include your primary Cardiologist (physician) and Advanced Practice Providers (APPs -  Physician Assistants and Nurse Practitioners) who all work together to provide you with the care you need, when you need it. You will need a follow up  appointment in 6 months.  Please call our office 2 months in advance to schedule this appointment.  You may see Dr. Harrell Gave or one of the following Advanced Practice Providers on your designated Care Team:   Rosaria Ferries, PA-C . Jory Sims, DNP, ANP  Any Other Special Instructions Will Be  Listed Below (If Applicable).    Signed, Buford Dresser, MD PhD 11/29/2017 3:29 PM    [place]

## 2017-11-29 NOTE — Patient Instructions (Signed)
Medication Instructions:  NONE  If you need a refill on your cardiac medications before your next appointment, please call your pharmacy.   Lab work: NONE  If you have labs (blood work) drawn today and your tests are completely normal, you will receive your results only by: Marland Kitchen MyChart Message (if you have MyChart) OR . A paper copy in the mail If you have any lab test that is abnormal or we need to change your treatment, we will call you to review the results.  Testing/Procedures: NONE   Follow-Up: At [facility], you and your health needs are our priority.  As part of our continuing mission to provide you with exceptional heart care, we have created designated Provider Care Teams.  These Care Teams include your primary Cardiologist (physician) and Advanced Practice Providers (APPs -  Physician Assistants and Nurse Practitioners) who all work together to provide you with the care you need, when you need it. You will need a follow up appointment in 6 months.  Please call our office 2 months in advance to schedule this appointment.  You may see Dr. Harrell Gave or one of the following Advanced Practice Providers on your designated Care Team:   Rosaria Ferries, PA-C . Jory Sims, DNP, ANP  Any Other Special Instructions Will Be Listed Below (If Applicable).

## 2017-12-01 ENCOUNTER — Encounter: Payer: Self-pay | Admitting: Cardiology

## 2018-01-06 ENCOUNTER — Telehealth: Payer: Self-pay

## 2018-01-06 NOTE — Telephone Encounter (Signed)
Received paperwork from [facility] requesting therapy to be continued. Paperwork has been signed by and faxed to [phone]. Confirmation fax has been received.

## 2018-01-10 ENCOUNTER — Telehealth: Payer: Self-pay | Admitting: Cardiology

## 2018-01-10 MED ORDER — AMLODIPINE BESYLATE 5 MG PO TABS: 5.0000 mg | ORAL_TABLET | Freq: Every day | ORAL | 3 refills | Status: DC

## 2018-01-10 NOTE — Telephone Encounter (Signed)
New Message    *STAT* If patient is at the pharmacy, call can be transferred to refill team.   1. Which medications need to be refilled? (please list name of each medication and dose if known) amLODipine (NORVASC) 5 MG tablet  2. Which pharmacy/location (including street and city if local pharmacy) is medication to be sent to? [id number] - [place], [place] - [address]  3. Do they need a 30 day or 90 day supply? [place]

## 2018-01-10 NOTE — Telephone Encounter (Signed)
Refill sent to the pharmacy electronically.  

## 2018-02-07 ENCOUNTER — Other Ambulatory Visit: Payer: Self-pay

## 2018-02-07 NOTE — Telephone Encounter (Signed)
Called left msg that pt needs labs and to call the office back and let us know how much she has on hand

## 2018-02-12 NOTE — Telephone Encounter (Signed)
Left 2nd msg stating that labs were needed

## 2018-02-17 ENCOUNTER — Other Ambulatory Visit: Payer: Self-pay

## 2018-02-17 DIAGNOSIS — I4891 Unspecified atrial fibrillation: Secondary | ICD-10-CM

## 2018-02-17 MED ORDER — ELIQUIS 5 MG PO TABS: 5.0000 mg | ORAL_TABLET | Freq: Two times a day (BID) | ORAL | 0 refills | Status: DC

## 2018-02-17 NOTE — Telephone Encounter (Signed)
Pt daughter called stating that the pt had lab work however it was not the lab work that they needed for the refill I authorized one week refill to get the pt through until they have the lab work done

## 2018-02-20 ENCOUNTER — Other Ambulatory Visit: Payer: Self-pay

## 2018-02-20 DIAGNOSIS — I4891 Unspecified atrial fibrillation: Secondary | ICD-10-CM

## 2018-02-20 LAB — BASIC METABOLIC PANEL
BUN/Creatinine Ratio: 10 — ABNORMAL LOW (ref 12–28)
BUN: 9 mg/dL (ref 8–27)
CALCIUM: 9.7 mg/dL (ref 8.7–10.3)
CHLORIDE: 97 mmol/L (ref 96–106)
CO2: 22 mmol/L (ref 20–29)
Creatinine, Ser: 0.91 mg/dL (ref 0.57–1.00)
GFR calc non Af Amer: 59 mL/min/{1.73_m2} — ABNORMAL LOW (ref >59)
GFR, EST AFRICAN AMERICAN: 67 mL/min/{1.73_m2} (ref >59)
Glucose: 86 mg/dL (ref 65–99)
POTASSIUM: 4.5 mmol/L (ref 3.5–5.2)
Sodium: 134 mmol/L (ref 134–144)

## 2018-02-26 ENCOUNTER — Telehealth: Payer: Self-pay

## 2018-02-26 ENCOUNTER — Other Ambulatory Visit: Payer: Self-pay | Admitting: Pharmacist

## 2018-02-26 MED ORDER — ELIQUIS 5 MG PO TABS: 5.0000 mg | ORAL_TABLET | Freq: Two times a day (BID) | ORAL | 1 refills | Status: DC

## 2018-02-26 NOTE — Telephone Encounter (Signed)
Pt's daughter requesting Eliquis refill. Will route to Pharm D.

## 2018-02-26 NOTE — Telephone Encounter (Signed)
Refill sent on 02/17/2017.  Needs different pharmacy? Or quantity?

## 2018-03-03 NOTE — Telephone Encounter (Signed)
Refill sent on 2/19.

## 2018-04-09 ENCOUNTER — Telehealth: Payer: Self-pay | Admitting: Cardiology

## 2018-04-09 NOTE — Telephone Encounter (Signed)
New Message:    Daughter is calling, she needs a letter for her husband. They take care of the pt and her husband. Son in-law is still working around a lot of people. He needs al etter sating that he does not need to be in his work environment, because of pt and husband's condition please.'

## 2018-04-10 NOTE — Telephone Encounter (Signed)
Letter at front desk per request.

## 2018-04-10 NOTE — Telephone Encounter (Signed)
Erika [name] returned your call.

## 2018-04-10 NOTE — Telephone Encounter (Signed)
Left message to call back  

## 2018-04-10 NOTE — Telephone Encounter (Signed)
Daughter updated with Dr. Judeth Cornfield recommendations. Daughter voiced understanding. Letter placed at the front desk for pick up.

## 2018-04-10 NOTE — Telephone Encounter (Signed)
Returning your call. °

## 2018-04-10 NOTE — Telephone Encounter (Signed)
I completely understand the concern. Unfortunately, as a department, we have looked at the legal issues, and we have decided as a group that we cannot legally demand for work changes for caregivers. We can send a letter with the following language, but it is up to the employer and employee to discuss what this means.   This is from the government regarding FMLA. It uses the word "flu", but currently coronavirus is similar: No Entitlement to FMLA Leave to Avoid Exposure. The guidance states that "FMLA protects eligible employees who are incapacitated by a serious health condition, as may be the case with the flu where complications arise, or who are needed to care for covered family members who are incapacitated by a serious health condition. Leave taken by an employee for the purpose of avoiding exposure to the flu would not be protected under FMLA."  I know it is a difficult time, and this is frustrating, but unfortunately as of now this is what we can do. Employers should be doing the right thing to help keep people from exposure, especially with what the governor has recommended. Unfortunately as providers we don't have the power to force employers to do certain actions. I wish I could be more helpful, but this is all we are able to do currently. Please stay as safe as you can!  To Whom It May Concern: Please note that the noted person (in this case, patient's son in law) is helping to care for an elderly family member. Given the currently available public health information, this elderly family member is at high risk of complications for coronavirus if they are infected. Please consider discussing with the noted employee opportunities to decrease their risk of exposure to protect their family member.  Sincerely,  Buford Dresser, MD, PhD Provider of the elderly family member

## 2018-06-27 ENCOUNTER — Telehealth: Payer: Self-pay

## 2018-06-27 NOTE — Telephone Encounter (Signed)
Attempted to call pt to go over [place] screening questions. Left message to call back.

## 2018-06-30 NOTE — Telephone Encounter (Signed)
   Spoke with pt's daughter. She report pt has dementia and will need assistance at appointment. Daughter made aware that both will need to wear a mask and screening questions are answered based off daughter and pt. Daughter also wanted to make Dr. Harrell Gave aware that pt used to attend Rehab prior to [place] and now no longer has a [facility] to be active. She state when pt is moving around, she's noticed that she is a little more SOB. She report pt will deny symptoms when asked by a provider and wanted to make MD aware ahead of time.    COVID-19 Pre-Screening Questions:  . In the past 7 to 10 days have you had a cough,  shortness of breath, headache, congestion, fever (100 or greater) body aches, chills, sore throat, or sudden loss of taste or sense of smell?NO . Have you been around anyone with known Covid 19.NO . Have you been around anyone who is awaiting Covid 19 test results in the past 7 to 10 days?NO . Have you been around anyone who has been exposed to Covid 19, or has mentioned symptoms of Covid 19 within the past 7 to 10 days?NO  If you have any concerns/questions about symptoms patients report during screening (either on the phone or at [facility]). Contact the provider seeing the patient or DOD for further guidance.  If neither are available contact a member of the leadership team.

## 2018-07-03 ENCOUNTER — Telehealth: Payer: Self-pay | Admitting: Cardiology

## 2018-07-03 NOTE — Telephone Encounter (Signed)
I called to confirm appt for 07-04-18 with Dr Harrell Gave. I talked to pt's daughter. Daughter will be with pt for her visit, pt has dementia.

## 2018-07-04 ENCOUNTER — Other Ambulatory Visit: Payer: Self-pay

## 2018-07-04 ENCOUNTER — Ambulatory Visit (INDEPENDENT_AMBULATORY_CARE_PROVIDER_SITE_OTHER): Payer: Medicare Other | Admitting: Cardiology

## 2018-07-04 ENCOUNTER — Encounter: Payer: Self-pay | Admitting: Cardiology

## 2018-07-04 VITALS — BP 156/54 | HR 55 | Temp 97.7°F | Ht 67.0 in | Wt 193.0 lb

## 2018-07-04 DIAGNOSIS — I10 Essential (primary) hypertension: Secondary | ICD-10-CM | POA: Diagnosis not present

## 2018-07-04 DIAGNOSIS — R600 Localized edema: Secondary | ICD-10-CM | POA: Diagnosis not present

## 2018-07-04 DIAGNOSIS — Z7189 Other specified counseling: Secondary | ICD-10-CM | POA: Diagnosis not present

## 2018-07-04 DIAGNOSIS — I4821 Permanent atrial fibrillation: Secondary | ICD-10-CM

## 2018-07-04 NOTE — Patient Instructions (Signed)
Medication Instructions:  Your Physician recommend you continue on your current medication as directed.    If you need a refill on your cardiac medications before your next appointment, please call your pharmacy.   Lab work: None If you have labs (blood work) drawn today and your tests are completely normal, you will receive your results only by: Marland Kitchen MyChart Message (if you have MyChart) OR . A paper copy in the mail If you have any lab test that is abnormal or we need to change your treatment, we will call you to review the results.  Testing/Procedures: None  Follow-Up: At [facility], you and your health needs are our priority.  As part of our continuing mission to provide you with exceptional heart care, we have created designated Provider Care Teams.  These Care Teams include your primary Cardiologist (physician) and Advanced Practice Providers (APPs -  Physician Assistants and Nurse Practitioners) who all work together to provide you with the care you need, when you need it. You will need a follow up appointment in 6 months.  Please call our office 2 months in advance to schedule this appointment.  You may see Dr. Harrell Gave or one of the following Advanced Practice Providers on your designated Care Team:   Rosaria Ferries, PA-C . Jory Sims, DNP, ANP

## 2018-07-04 NOTE — Progress Notes (Signed)
Cardiology Office Note:    Date:  07/04/2018   ID:  Erika [name], DOB 1932/12/16, MRN [id number]  PCP:  Tamsen Roers, MD  Cardiologist:  Buford Dresser, MD PhD  Referring MD: Tamsen Roers, MD   CC: follow up  History of Present Illness:    Erika [name] is a 85 y.o. female with a hx of hypertension, permanent atrial fibrillation on anticoagulation with apixaban, hypothyroidism, obesity, hyperlipidemia, TIA who is seen in follow up (Dr. Wynonia Lawman patient) at the request of Tamsen Roers, MD for the evaluation and management of atrial fibrillation.  Per Dr. Thurman Coyer note, had history of esophageal perforation with TEE in 2011. Had cardioversion in 2009 and 2011, cardiolite 2011, echocardiogram 2011 with LVEF 50%.   Today:  Overall stable. Avoiding exposure to coronavirus. Tolerating medications. Has had some mild LE edema. She is mainly sedentary at home, we discussed the benefits of walking some even if she can't do much.   She doesn't check BP at home, but she does have a BP cuff. She is willing to start logging. Discussed on how to check BP properly and log.   Denies chest pain, shortness of breath at rest or with normal exertion. No PND, orthopnea, or unexpected weight gain. No syncope or palpitations.  Past Medical History:  Diagnosis Date  . Abdominal pain   . Anemia   . ANEMIA 06/15/2009   Qualifier: Diagnosis of  By: Selena Batten CMA, [place]    . Aortic stenosis    moderate AS by 06/2011 echo  . Arthritis   . Atrial fibrillation ([place]) 06/15/2009   Qualifier: Diagnosis of  By: Selena Batten CMA, Jewel    . Cardiac arrhythmia   . Gallstones, probable biliary colic 02/12/2351  . GERD (gastroesophageal reflux disease)   . H/O hiatal hernia   . Hyperlipidemia   . HYPERLIPIDEMIA 06/15/2009   Qualifier: Diagnosis of  By: Selena Batten CMA, Jewel    . Hypertension   . HYPERTENSION 06/15/2009   Qualifier: Diagnosis of  By: Selena Batten CMA, Jewel    . Hyperthyroidism   . Memory disorder 04/04/2016  . Obesity    . OBESITY 06/15/2009   Qualifier: Diagnosis of  By: Selena Batten CMA, Jewel    . Seasonal allergies   . SLEEP APNEA 06/16/2009   Qualifier: Diagnosis of  By: Rayann Heman, MD, Jeneen Rinks    . Unspecified hypothyroidism 06/15/2009   Qualifier: Diagnosis of  By: Selena Batten CMA, Jewel      Past Surgical History:  Procedure Laterality Date  . BREAST SURGERY  2012 (June or July)   right  . CHOLECYSTECTOMY  06/28/2011   Procedure: LAPAROSCOPIC CHOLECYSTECTOMY WITH INTRAOPERATIVE CHOLANGIOGRAM;  Surgeon: Merrie Roof, MD;  Location: [place];  Service: General;  Laterality: N/A;  . pharyngeal repair  [id number]   Dr Constance Holster for post-TEE pharygeal tear w neck abscess I&D  . THYROIDECTOMY  1961   80% removed    Current Medications: Current Outpatient Medications on File Prior to Visit  Medication Sig  . amLODipine (NORVASC) 5 MG tablet Take 1 tablet (5 mg total) by mouth daily.  Marland Kitchen ELIQUIS 5 MG TABS tablet Take 1 tablet (5 mg total) by mouth 2 (two) times daily.  . ergocalciferol (VITAMIN D2) 50000 UNITS capsule Take 50,000 Units by mouth once a week. Take on thursday  . escitalopram (LEXAPRO) 10 MG tablet TAKE 1 TABLET BY MOUTH DAILY TO HELP DEPRESSION  . furosemide (LASIX) 20 MG tablet Take 10 mg by mouth.   . levothyroxine (SYNTHROID, LEVOTHROID)  175 MCG tablet Take 175 mcg by mouth every morning.   Marland Kitchen lisinopril (PRINIVIL,ZESTRIL) 40 MG tablet 40 mg daily.  Marland Kitchen omega-3 acid ethyl esters (LOVAZA) 1 G capsule Take 1 g by mouth 2 (two) times daily.   Marland Kitchen omeprazole (PRILOSEC) 20 MG capsule   . Red Yeast Rice Extract (RED YEAST RICE PO) Take 1 tablet by mouth daily.   No current facility-administered medications on file prior to visit.      Allergies:   Celecoxib, Amiodarone, Dronedarone hydrochloride, Ezetimibe, Flecainide acetate, Namenda [memantine hcl], and Sulfonamide derivatives   Social History   Socioeconomic History  . Marital status: Married    Spouse name: Jeneen Rinks  . Number of children: 2  . Years of  education: 54  . Highest education level: Not on file  Occupational History  . Occupation: Retired  Scientific laboratory technician  . Financial resource strain: Not on file  . Food insecurity    Worry: Not on file    Inability: Not on file  . Transportation needs    Medical: Not on file    Non-medical: Not on file  Tobacco Use  . Smoking status: Never Smoker  . Smokeless tobacco: Never Used  Substance and Sexual Activity  . Alcohol use: No  . Drug use: No  . Sexual activity: Yes    Birth control/protection: Post-menopausal  Lifestyle  . Physical activity    Days per week: Not on file    Minutes per session: Not on file  . Stress: Not on file  Relationships  . Social Herbalist on phone: Not on file    Gets together: Not on file    Attends religious service: Not on file    Active member of club or organization: Not on file    Attends meetings of clubs or organizations: Not on file    Relationship status: Not on file  Other Topics Concern  . Not on file  Social History Narrative   Lives with husband in [place], [place].   Independent in activities of daily living. Still drives.   Caffeine use: Coffee and Mt Dew daily   Right-handed     Family History: The patient's family history includes Cancer in her maternal aunt; Heart disease in her father and mother. Brother with CAD, another brother with CAD and hyperlipidemia, another brother with CAD. Both father and mother deceased, both had CAD. Sister has cancer and CAD.  ROS:   Please see the history of present illness.  Additional pertinent ROS: Constitutional: Negative for chills, fever, night sweats, unintentional weight loss  HENT: Negative for ear pain and hearing loss.   Eyes: Negative for loss of vision and eye pain.  Respiratory: Negative for cough, sputum, wheezing.   Cardiovascular: See HPI. Gastrointestinal: Negative for abdominal pain, melena, and hematochezia.  Genitourinary: Negative for dysuria and hematuria.   Musculoskeletal: Negative for falls and myalgias.  Skin: Negative for itching and rash.  Neurological: Negative for focal weakness, focal sensory changes and loss of consciousness.  Endo/Heme/Allergies: Does not bruise/bleed easily.    EKGs/Labs/Other Studies Reviewed:    The following studies were reviewed today: Note from Dr. Wynonia Lawman previously  EKG:  EKG is personally reviewed.  The ekg ordered 11/28/17 demonstrates atrial fibrillation with slow ventricular response, heart rate 56  Recent Labs: 02/20/2018: BUN 9; Creatinine, Ser 0.91; Potassium 4.5; Sodium 134  Recent Lipid Panel    Component Value Date/Time   CHOL 303 (H) 06/23/2011 0635   TRIG 61  06/23/2011 0635   HDL 52 06/23/2011 0635   CHOLHDL 5.8 06/23/2011 0635   VLDL 12 06/23/2011 0635   LDLCALC 239 (H) 06/23/2011 0635    Physical Exam:    VS:  BP (!) 156/54 (BP Location: Left Arm, Patient Position: Sitting, Cuff Size: Normal)   Pulse (!) 55   Temp 97.7 F (36.5 C)   Ht 5\' 7"  (1.702 m)   Wt 193 lb (87.5 kg)   BMI 30.23 kg/m     Repeat BP 146/62  Wt Readings from Last 3 Encounters:  07/04/18 193 lb (87.5 kg)  11/29/17 194 lb (88 kg)  11/14/17 196 lb (88.9 kg)    GEN: Well nourished, well developed in no acute distress HEENT: Normal NECK: No JVD; No carotid bruits LYMPHATICS: No lymphadenopathy CARDIAC: regular rhythm, normal S1 and S2, no murmurs, rubs, gallops. Radial and DP pulses 2+ bilaterally. RESPIRATORY:  Clear to auscultation without rales, wheezing or rhonchi  ABDOMEN: Soft, non-tender, non-distended MUSCULOSKELETAL:  Trace BL LE edema; No deformity  SKIN: Warm and dry NEUROLOGIC:  Alert and oriented x 3 PSYCHIATRIC:  Normal affect   ASSESSMENT:    1. Permanent atrial fibrillation   2. Essential hypertension   3. Counseling on health promotion and disease prevention   4. Educated About Covid-19 Virus Infection    PLAN:    Atrial fibrillation, permanent: history of TIA -CHA2DS2/VAS  Stroke Risk Points  = 6 (HTN, age, TIA, gender) -tolerating anticoagulation, on apixaban 5 mg BID. BMET 02/2018 with stable GFR -rate controlled on no AV nodal agents  LE edema: trace, well controlled on 10 mg lasix daily  Hypertension: repeat improved, still slightly above goal but reasonable for age. Would like to make sure she is not having highs/lows at home -on amlodipine 5 mg, furosemide 10 mg, lisinopril 40 mg daily -has cuff, counseled on how to properly measure and log BP  Prevention counseling: -recommend heart healthy/Mediterranean diet, with whole grains, fruits, vegetable, fish, lean meats, nuts, and olive oil. Limit salt. -recommend moderate walking, 3-5 times/week for 30-50 minutes each session. Aim for at least 150 minutes.week. Goal should be pace of 3 miles/hours, or walking 1.5 miles in 30 minutes -recommend avoidance of tobacco products. Avoid excess alcohol.  Plan for follow up: 6 mos  Medication Adjustments/Labs and Tests Ordered: Current medicines are reviewed at length with the patient today.  Concerns regarding medicines are outlined above.  No orders of the defined types were placed in this encounter.  No orders of the defined types were placed in this encounter.   Patient Instructions  Medication Instructions:  Your Physician recommend you continue on your current medication as directed.    If you need a refill on your cardiac medications before your next appointment, please call your pharmacy.   Lab work: None If you have labs (blood work) drawn today and your tests are completely normal, you will receive your results only by: Marland Kitchen MyChart Message (if you have MyChart) OR . A paper copy in the mail If you have any lab test that is abnormal or we need to change your treatment, we will call you to review the results.  Testing/Procedures: None  Follow-Up: At [facility], you and your health needs are our priority.  As part of our continuing mission to  provide you with exceptional heart care, we have created designated Provider Care Teams.  These Care Teams include your primary Cardiologist (physician) and Advanced Practice Providers (APPs -  Physician Assistants and  Nurse Practitioners) who all work together to provide you with the care you need, when you need it. You will need a follow up appointment in 6 months.  Please call our office 2 months in advance to schedule this appointment.  You may see Dr. Harrell Gave or one of the following Advanced Practice Providers on your designated Care Team:   Rosaria Ferries, PA-C . Jory Sims, DNP, ANP      Signed, Buford Dresser, MD PhD 07/04/2018  [place]

## 2018-07-17 ENCOUNTER — Ambulatory Visit (INDEPENDENT_AMBULATORY_CARE_PROVIDER_SITE_OTHER): Payer: Medicare Other | Admitting: Adult Health

## 2018-07-17 ENCOUNTER — Other Ambulatory Visit: Payer: Self-pay

## 2018-07-17 ENCOUNTER — Encounter: Payer: Self-pay | Admitting: Adult Health

## 2018-07-17 VITALS — BP 130/68 | HR 52 | Temp 97.8°F | Ht 67.0 in | Wt 192.8 lb

## 2018-07-17 DIAGNOSIS — R413 Other amnesia: Secondary | ICD-10-CM

## 2018-07-17 NOTE — Patient Instructions (Signed)
Your Plan:  Continue to monitor memory Memory score is stable If your symptoms worsen or you develop new symptoms please let us know.   Thank you for coming to see us at [facility]. I hope we have been able to provide you high quality care today.  You may receive a patient satisfaction survey over the next few weeks. We would appreciate your feedback and comments so that we may continue to improve ourselves and the health of our patients.  

## 2018-07-17 NOTE — Progress Notes (Signed)
I have read the note, and I agree with the clinical assessment and plan.  Charles K Willis   

## 2018-07-17 NOTE — Progress Notes (Signed)
PATIENT: Erika [name] DOB: [birth year]  REASON FOR VISIT: follow up HISTORY FROM: patient  HISTORY OF PRESENT ILLNESS: Today 07/17/18:  Erika [name] is an 85 year old female with a history of memory disturbance.  She returns today for follow-up.  She reports that she continues at home with her husband.  She is able to complete all ADLs independently.  Her daughter helps her with the finances.  She no longer cooks.  She states that they either go pick up food or her family brings her food.  She does not operate a motor vehicle.  In the past she has tried Aricept and Namenda but was unable to tolerate those medications.  She returns today for follow-up.  HISTORY 11/14/17: Erika [name] is an 85 year old female with a history of memory disturbance.  She returns today for follow-up.  She is here with her daughter.  She feels that her memory has remained stable.  She lives at home with her husband.  She is able to complete all ADLs independently.  Her daughter reports that she does need some prompting with certain things.  Her daughter helps her with her finances.  Patient denies any trouble sleeping.  Denies any changes with her mood or behavior.  No hallucinations.  She does feel that her appetite has decreased.  Daughter states that she typically will take several bites of food and then states she is full.  She is currently taking Aricept 5 mg at bedtime.  They report decrease in Aricept did not change the loose stools.  She was unable to tolerate Namenda as it made her too drowsy.  Patient's gait is unsteady.  Daughter reports that she had a fall several months ago.  States that she was going up steps and fell.  Fortunately she only had bruising.  She returns today for evaluation.  REVIEW OF SYSTEMS: Out of a complete 14 system review of symptoms, the patient complains only of the following symptoms, and all other reviewed systems are negative.  See HPI  ALLERGIES: Allergies  Allergen Reactions   . Celecoxib Hives and Itching    Face only  . Amiodarone     Ineffective   . Dronedarone Hydrochloride     Edema   . Ezetimibe     Fatigue   . Flecainide Acetate     fatigue  . Namenda [Memantine Hcl] Other (See Comments)    Slept alot, felt terrible  . Sulfonamide Derivatives Rash    "Mouth breaks out"    HOME MEDICATIONS: Outpatient Medications Prior to Visit  Medication Sig Dispense Refill  . amLODipine (NORVASC) 5 MG tablet Take 1 tablet (5 mg total) by mouth daily. 90 tablet 3  . ELIQUIS 5 MG TABS tablet Take 1 tablet (5 mg total) by mouth 2 (two) times daily. 180 tablet 1  . ergocalciferol (VITAMIN D2) 50000 UNITS capsule Take 50,000 Units by mouth once a week. Take on thursday    . escitalopram (LEXAPRO) 10 MG tablet TAKE 1 TABLET BY MOUTH DAILY TO HELP DEPRESSION  3  . furosemide (LASIX) 20 MG tablet Take 10 mg by mouth.     . levothyroxine (SYNTHROID, LEVOTHROID) 175 MCG tablet Take 175 mcg by mouth every morning.     Marland Kitchen lisinopril (PRINIVIL,ZESTRIL) 40 MG tablet 40 mg daily.    Marland Kitchen omega-3 acid ethyl esters (LOVAZA) 1 G capsule Take 1 g by mouth 2 (two) times daily.     Marland Kitchen omeprazole (PRILOSEC) 20 MG capsule     .  Red Yeast Rice Extract (RED YEAST RICE PO) Take 1 tablet by mouth daily.     No facility-administered medications prior to visit.     PAST MEDICAL HISTORY: Past Medical History:  Diagnosis Date  . Abdominal pain   . Anemia   . ANEMIA 06/15/2009   Qualifier: Diagnosis of  By: Selena Batten CMA, Jewel    . Aortic stenosis    moderate AS by 06/2011 echo  . Arthritis   . Atrial fibrillation ([place]) 06/15/2009   Qualifier: Diagnosis of  By: Selena Batten CMA, Jewel    . Cardiac arrhythmia   . Gallstones, probable biliary colic 6/0/4540  . GERD (gastroesophageal reflux disease)   . H/O hiatal hernia   . Hyperlipidemia   . HYPERLIPIDEMIA 06/15/2009   Qualifier: Diagnosis of  By: Selena Batten CMA, Jewel    . Hypertension   . HYPERTENSION 06/15/2009   Qualifier: Diagnosis of  By: Selena Batten  CMA, Jewel    . Hyperthyroidism   . Memory disorder 04/04/2016  . Obesity   . OBESITY 06/15/2009   Qualifier: Diagnosis of  By: Selena Batten CMA, Jewel    . Seasonal allergies   . SLEEP APNEA 06/16/2009   Qualifier: Diagnosis of  By: Rayann Heman, MD, Erika [name]    . Unspecified hypothyroidism 06/15/2009   Qualifier: Diagnosis of  By: Selena Batten CMA, Jewel      PAST SURGICAL HISTORY: Past Surgical History:  Procedure Laterality Date  . BREAST SURGERY  2012 (June or July)   right  . CHOLECYSTECTOMY  06/28/2011   Procedure: LAPAROSCOPIC CHOLECYSTECTOMY WITH INTRAOPERATIVE CHOLANGIOGRAM;  Surgeon: Merrie Roof, MD;  Location: [facility];  Service: General;  Laterality: N/A;  . pharyngeal repair  [id number]   Dr Constance Holster for post-TEE pharygeal tear w neck abscess I&D  . THYROIDECTOMY  1961   80% removed    FAMILY HISTORY: Family History  Problem Relation Age of Onset  . Heart disease Mother   . Heart disease Father   . Cancer Maternal Aunt        breast    SOCIAL HISTORY: Social History   Socioeconomic History  . Marital status: Married    Spouse name: Erika [name]  . Number of children: 2  . Years of education: 47  . Highest education level: Not on file  Occupational History  . Occupation: Retired  Scientific laboratory technician  . Financial resource strain: Not on file  . Food insecurity    Worry: Not on file    Inability: Not on file  . Transportation needs    Medical: Not on file    Non-medical: Not on file  Tobacco Use  . Smoking status: Never Smoker  . Smokeless tobacco: Never Used  Substance and Sexual Activity  . Alcohol use: No  . Drug use: No  . Sexual activity: Yes    Birth control/protection: Post-menopausal  Lifestyle  . Physical activity    Days per week: Not on file    Minutes per session: Not on file  . Stress: Not on file  Relationships  . Social Herbalist on phone: Not on file    Gets together: Not on file    Attends religious service: Not on file    Active member of club or  organization: Not on file    Attends meetings of clubs or organizations: Not on file    Relationship status: Not on file  . Intimate partner violence    Fear of current or ex partner: Not on file  Emotionally abused: Not on file    Physically abused: Not on file    Forced sexual activity: Not on file  Other Topics Concern  . Not on file  Social History Narrative   Lives with husband in [place], [place].   Independent in activities of daily living. Still drives.   Caffeine use: Coffee and Mt Dew daily   Right-handed      PHYSICAL EXAM  There were no vitals filed for this visit. There is no height or weight on file to calculate BMI.   MMSE - Mini Mental State Exam 07/17/2018 11/14/2017 04/11/2017  Orientation to time 5 4 2   Orientation to Place 4 4 4   Registration 3 3 3   Attention/ Calculation 5 3 2   Recall 0 1 1  Language- name 2 objects 2 2 2   Language- repeat 1 1 1   Language- follow 3 step command 3 3 3   Language- read & follow direction 1 1 1   Write a sentence 1 1 1   Copy design 1 0 0  Copy design-comments 8 animals - -  Total score 26 23 20      Generalized: Well developed, in no acute distress   Neurological examination  Mentation: Alert oriented to time, place, history taking. Follows all commands speech and language fluent Cranial nerve II-XII: Pupils were equal round reactive to light. Extraocular movements were full, visual field were full on confrontational test. Facial sensation and strength were normal. Uvula tongue midline. Head turning and shoulder shrug  were normal and symmetric. Motor: The motor testing reveals 5 over 5 strength of all 4 extremities. Good symmetric motor tone is noted throughout.  Sensory: Sensory testing is intact to soft touch on all 4 extremities. No evidence of extinction is noted.  Coordination: Cerebellar testing reveals good finger-nose-finger and heel-to-shin bilaterally.  Gait and station: Gait is slightly unsteady.   DIAGNOSTIC DATA  (LABS, IMAGING, TESTING) - I reviewed patient records, labs, notes, testing and imaging myself where available.  Lab Results  Component Value Date   WBC 9.8 01/01/2012   HGB 11.8 (L) 01/01/2012   HCT 34.8 (L) 01/01/2012   MCV 85.1 01/01/2012   PLT 225 01/01/2012      Component Value Date/Time   NA 134 02/20/2018 1138   K 4.5 02/20/2018 1138   CL 97 02/20/2018 1138   CO2 22 02/20/2018 1138   GLUCOSE 86 02/20/2018 1138   GLUCOSE 103 (H) 01/01/2012 2140   BUN 9 02/20/2018 1138   CREATININE 0.91 02/20/2018 1138   CALCIUM 9.7 02/20/2018 1138   PROT 7.0 01/01/2012 2140   ALBUMIN 3.7 01/01/2012 2140   AST 18 01/01/2012 2140   ALT 13 01/01/2012 2140   ALKPHOS 83 01/01/2012 2140   BILITOT 0.2 (L) 01/01/2012 2140   GFRNONAA 59 (L) 02/20/2018 1138   GFRAA 67 02/20/2018 1138   Lab Results  Component Value Date   CHOL 303 (H) 06/23/2011   HDL 52 06/23/2011   LDLCALC 239 (H) 06/23/2011   TRIG 61 06/23/2011   CHOLHDL 5.8 06/23/2011   Lab Results  Component Value Date   HGBA1C 5.6 06/23/2011   No results found for: VITAMINB12 Lab Results  Component Value Date   TSH 0.285 (L) 06/23/2011      ASSESSMENT AND PLAN 85 y.o. year old female  has a past medical history of Abdominal pain, Anemia, ANEMIA (06/15/2009), Aortic stenosis, Arthritis, Atrial fibrillation ([facility]) (06/15/2009), Cardiac arrhythmia, Gallstones, probable biliary colic (02/09/2977), GERD (gastroesophageal reflux disease), H/O hiatal hernia,  Hyperlipidemia, HYPERLIPIDEMIA (06/15/2009), Hypertension, HYPERTENSION (06/15/2009), Hyperthyroidism, Memory disorder (04/04/2016), Obesity, OBESITY (06/15/2009), Seasonal allergies, SLEEP APNEA (06/16/2009), and Unspecified hypothyroidism (06/15/2009). here with :  1.  Memory disturbance  The patient's memory score has remained stable.  I have advised that we will continue to monitor the memory.  She is advised that if her symptoms worsen or she develops new symptoms she should let us know.  She  will follow-up in 6 months or sooner if needed.   I spent 15 minutes with the patient. 50% of this time was spent discussing memory score   Ward Givens, MSN, NP-C 07/17/2018, 11:52 AM [facility] [address] [place], [place] [address] [phone]

## 2018-07-18 ENCOUNTER — Encounter: Payer: Self-pay | Admitting: Cardiology

## 2018-08-21 ENCOUNTER — Other Ambulatory Visit: Payer: Self-pay | Admitting: Cardiology

## 2018-08-21 NOTE — Telephone Encounter (Signed)
62 f 87.5kg Scr 0.91 02/20/18 Lovw/christopher 07/04/18

## 2018-09-18 ENCOUNTER — Other Ambulatory Visit: Payer: Self-pay | Admitting: Cardiology

## 2018-09-18 MED ORDER — LISINOPRIL 40 MG PO TABS: 40.0000 mg | ORAL_TABLET | Freq: Every day | ORAL | 1 refills | Status: DC

## 2018-09-18 NOTE — Telephone Encounter (Signed)
° ° ° °*  STAT* If patient is at the pharmacy, call can be transferred to refill team.   1. Which medications need to be refilled? (please list name of each medication and dose if known) lisinopril (PRINIVIL,ZESTRIL) 40 MG tablet  2. Which pharmacy/location (including street and city if local pharmacy) is medication to be sent to? [facility]  3. Do they need a 30 day or 90 day supply? [place]

## 2019-01-11 ENCOUNTER — Other Ambulatory Visit: Payer: Self-pay | Admitting: Cardiology

## 2019-01-26 ENCOUNTER — Telehealth: Payer: Self-pay | Admitting: Cardiology

## 2019-01-26 NOTE — Telephone Encounter (Signed)
Is this OK - per chart review, daughter may have been with patient June 2020 visit

## 2019-01-26 NOTE — Telephone Encounter (Signed)
Per pt's daughters call needs to be with pt at the appt pt has Dementia please let her know.

## 2019-01-27 NOTE — Telephone Encounter (Signed)
Left message to call back  

## 2019-01-29 ENCOUNTER — Encounter: Payer: Self-pay | Admitting: Adult Health

## 2019-01-29 ENCOUNTER — Ambulatory Visit (INDEPENDENT_AMBULATORY_CARE_PROVIDER_SITE_OTHER): Payer: Medicare Other | Admitting: Cardiology

## 2019-01-29 ENCOUNTER — Other Ambulatory Visit: Payer: Self-pay

## 2019-01-29 ENCOUNTER — Ambulatory Visit (INDEPENDENT_AMBULATORY_CARE_PROVIDER_SITE_OTHER): Payer: Medicare Other | Admitting: Adult Health

## 2019-01-29 ENCOUNTER — Encounter: Payer: Self-pay | Admitting: Cardiology

## 2019-01-29 VITALS — BP 214/72 | HR 63 | Temp 97.6°F | Ht 67.0 in | Wt 181.0 lb

## 2019-01-29 VITALS — BP 195/74 | HR 69 | Temp 97.9°F | Ht 67.0 in | Wt 181.0 lb

## 2019-01-29 DIAGNOSIS — R600 Localized edema: Secondary | ICD-10-CM

## 2019-01-29 DIAGNOSIS — I4821 Permanent atrial fibrillation: Secondary | ICD-10-CM | POA: Diagnosis not present

## 2019-01-29 DIAGNOSIS — Z8673 Personal history of transient ischemic attack (TIA), and cerebral infarction without residual deficits: Secondary | ICD-10-CM

## 2019-01-29 DIAGNOSIS — R413 Other amnesia: Secondary | ICD-10-CM

## 2019-01-29 DIAGNOSIS — I10 Essential (primary) hypertension: Secondary | ICD-10-CM

## 2019-01-29 MED ORDER — CARVEDILOL 3.125 MG PO TABS: 3.1250 mg | ORAL_TABLET | Freq: Two times a day (BID) | ORAL | 11 refills | Status: DC

## 2019-01-29 MED ORDER — AMLODIPINE BESYLATE 10 MG PO TABS: 10.0000 mg | ORAL_TABLET | Freq: Every day | ORAL | 3 refills | Status: DC

## 2019-01-29 NOTE — Progress Notes (Signed)
PATIENT: Erika [name] DOB: [birth year]  REASON FOR VISIT: follow up HISTORY FROM: patient  HISTORY OF PRESENT ILLNESS: Today 01/29/19:  Erika [name] is an 86 year old female with a history of memory disturbance.  She returns today for follow-up.  She is here with her daughter.  She continues to live at home with her husband.  She is able to complete all ADLs independently.  She no longer operates a motor vehicle.  Her daughter continues to help with her finances.  She does not prepare any meals.  They either pick up food or she has family that will drop something off.  She has tried Aricept and Namenda in the past but was unable to tolerate the medication.  She does not wish to start any new medication.  HISTORY 07/17/18:  Erika [name] is an 86 year old female with a history of memory disturbance.  She returns today for follow-up.  She reports that she continues at home with her husband.  She is able to complete all ADLs independently.  Her daughter helps her with the finances.  She no longer cooks.  She states that they either go pick up food or her family brings her food.  She does not operate a motor vehicle.  In the past she has tried Aricept and Namenda but was unable to tolerate those medications.  She returns today for follow-up.   REVIEW OF SYSTEMS: Out of a complete 14 system review of symptoms, the patient complains only of the following symptoms, and all other reviewed systems are negative.  See HPI  ALLERGIES: Allergies  Allergen Reactions  . Celecoxib Hives and Itching    Face only  . Amiodarone     Ineffective   . Dronedarone Hydrochloride     Edema   . Ezetimibe     Fatigue   . Flecainide Acetate     fatigue  . Namenda [Memantine Hcl] Other (See Comments)    Slept alot, felt terrible  . Sulfonamide Derivatives Rash    "Mouth breaks out"    HOME MEDICATIONS: Outpatient Medications Prior to Visit  Medication Sig Dispense Refill  . amLODipine (NORVASC) 5  MG tablet TAKE 1 TABLET BY MOUTH EVERY DAY 90 tablet 0  . ELIQUIS 5 MG TABS tablet TAKE 1 TABLET BY MOUTH TWICE A DAY 180 tablet 1  . ergocalciferol (VITAMIN D2) 50000 UNITS capsule Take 50,000 Units by mouth once a week. Take on thursday    . escitalopram (LEXAPRO) 10 MG tablet TAKE 1 TABLET BY MOUTH DAILY TO HELP DEPRESSION  3  . furosemide (LASIX) 20 MG tablet Take 10 mg by mouth.     . levothyroxine (SYNTHROID, LEVOTHROID) 175 MCG tablet Take 175 mcg by mouth every morning.     Marland Kitchen lisinopril (ZESTRIL) 40 MG tablet Take 1 tablet (40 mg total) by mouth daily. 90 tablet 1  . omega-3 acid ethyl esters (LOVAZA) 1 G capsule Take 1 g by mouth 2 (two) times daily.     Marland Kitchen omeprazole (PRILOSEC) 20 MG capsule     . Red Yeast Rice Extract (RED YEAST RICE PO) Take 1 tablet by mouth daily.     No facility-administered medications prior to visit.    PAST MEDICAL HISTORY: Past Medical History:  Diagnosis Date  . Abdominal pain   . Anemia   . ANEMIA 06/15/2009   Qualifier: Diagnosis of  By: Selena Batten CMA, Jewel    . Aortic stenosis    moderate AS by 06/2011 echo  . Arthritis   .  Atrial fibrillation ([place]) 06/15/2009   Qualifier: Diagnosis of  By: Selena Batten CMA, Jewel    . Cardiac arrhythmia   . Gallstones, probable biliary colic Q000111Q  . GERD (gastroesophageal reflux disease)   . H/O hiatal hernia   . Hyperlipidemia   . HYPERLIPIDEMIA 06/15/2009   Qualifier: Diagnosis of  By: Selena Batten CMA, Jewel    . Hypertension   . HYPERTENSION 06/15/2009   Qualifier: Diagnosis of  By: Selena Batten CMA, Jewel    . Hyperthyroidism   . Memory disorder 04/04/2016  . Obesity   . OBESITY 06/15/2009   Qualifier: Diagnosis of  By: Selena Batten CMA, Jewel    . Seasonal allergies   . SLEEP APNEA 06/16/2009   Qualifier: Diagnosis of  By: Rayann Heman, MD, Jeneen Rinks    . Unspecified hypothyroidism 06/15/2009   Qualifier: Diagnosis of  By: Selena Batten CMA, Jewel      PAST SURGICAL HISTORY: Past Surgical History:  Procedure Laterality Date  . BREAST SURGERY   2012 (June or July)   right  . CHOLECYSTECTOMY  06/28/2011   Procedure: LAPAROSCOPIC CHOLECYSTECTOMY WITH INTRAOPERATIVE CHOLANGIOGRAM;  Surgeon: Merrie Roof, MD;  Location: [place];  Service: General;  Laterality: N/A;  . pharyngeal repair  [id number]   Dr Constance Holster for post-TEE pharygeal tear w neck abscess I&D  . THYROIDECTOMY  1961   80% removed    FAMILY HISTORY: Family History  Problem Relation Age of Onset  . Heart disease Mother   . Heart disease Father   . Cancer Maternal Aunt        breast    SOCIAL HISTORY: Social History   Socioeconomic History  . Marital status: Married    Spouse name: Jeneen Rinks  . Number of children: 2  . Years of education: 72  . Highest education level: Not on file  Occupational History  . Occupation: Retired  Tobacco Use  . Smoking status: Never Smoker  . Smokeless tobacco: Never Used  Substance and Sexual Activity  . Alcohol use: No  . Drug use: No  . Sexual activity: Yes    Birth control/protection: Post-menopausal  Other Topics Concern  . Not on file  Social History Narrative   Lives with husband in [place], [place].   Independent in activities of daily living. Still drives.   Caffeine use: Coffee and Mt Dew daily   Right-handed   Social Determinants of Health   Financial Resource Strain:   . Difficulty of Paying Living Expenses: Not on file  Food Insecurity:   . Worried About Charity fundraiser in the Last Year: Not on file  . Ran Out of Food in the Last Year: Not on file  Transportation Needs:   . Lack of Transportation (Medical): Not on file  . Lack of Transportation (Non-Medical): Not on file  Physical Activity:   . Days of Exercise per Week: Not on file  . Minutes of Exercise per Session: Not on file  Stress:   . Feeling of Stress : Not on file  Social Connections:   . Frequency of Communication with Friends and Family: Not on file  . Frequency of Social Gatherings with Friends and Family: Not on file  . Attends Religious  Services: Not on file  . Active Member of Clubs or Organizations: Not on file  . Attends Archivist Meetings: Not on file  . Marital Status: Not on file  Intimate Partner Violence:   . Fear of Current or Ex-Partner: Not on file  . Emotionally Abused:  Not on file  . Physically Abused: Not on file  . Sexually Abused: Not on file      PHYSICAL EXAM  Vitals:   01/29/19 1451  BP: (!) 214/72  Pulse: 63  Temp: 97.6 F (36.4 C)  Weight: 181 lb (82.1 kg)  Height: 5\' 7"  (1.702 m)   Body mass index is 28.35 kg/m.   MMSE - Mini Mental State Exam 01/29/2019 07/17/2018 11/14/2017  Orientation to time 3 5 4   Orientation to Place 4 4 4   Registration 3 3 3   Attention/ Calculation 2 5 3   Recall 1 0 1  Language- name 2 objects 2 2 2   Language- repeat 1 1 1   Language- follow 3 step command 3 3 3   Language- read & follow direction 1 1 1   Write a sentence 1 1 1   Copy design 0 1 0  Copy design-comments named 6 animals 8 animals -  Total score 21 26 23      Generalized: Well developed, in no acute distress   Neurological examination  Mentation: Alert oriented to time, place, history taking. Follows all commands speech and language fluent Cranial nerve II-XII: Pupils were equal round reactive to light. Extraocular movements were full, visual field were full on confrontational test. Head turning and shoulder shrug  were normal and symmetric. Motor: The motor testing reveals 5 over 5 strength of all 4 extremities. Good symmetric motor tone is noted throughout.  Sensory: Sensory testing is intact to soft touch on all 4 extremities. No evidence of extinction is noted.  Coordination: Cerebellar testing reveals good finger-nose-finger and heel-to-shin bilaterally.  Gait and station: Gait is normal.  Reflexes: Deep tendon reflexes are symmetric and normal bilaterally.   DIAGNOSTIC DATA (LABS, IMAGING, TESTING) - I reviewed patient records, labs, notes, testing and imaging myself where  available.  Lab Results  Component Value Date   WBC 9.8 01/01/2012   HGB 11.8 (L) 01/01/2012   HCT 34.8 (L) 01/01/2012   MCV 85.1 01/01/2012   PLT 225 01/01/2012      Component Value Date/Time   NA 134 02/20/2018 1138   K 4.5 02/20/2018 1138   CL 97 02/20/2018 1138   CO2 22 02/20/2018 1138   GLUCOSE 86 02/20/2018 1138   GLUCOSE 103 (H) 01/01/2012 2140   BUN 9 02/20/2018 1138   CREATININE 0.91 02/20/2018 1138   CALCIUM 9.7 02/20/2018 1138   PROT 7.0 01/01/2012 2140   ALBUMIN 3.7 01/01/2012 2140   AST 18 01/01/2012 2140   ALT 13 01/01/2012 2140   ALKPHOS 83 01/01/2012 2140   BILITOT 0.2 (L) 01/01/2012 2140   GFRNONAA 59 (L) 02/20/2018 1138   GFRAA 67 02/20/2018 1138   Lab Results  Component Value Date   CHOL 303 (H) 06/23/2011   HDL 52 06/23/2011   LDLCALC 239 (H) 06/23/2011   TRIG 61 06/23/2011   CHOLHDL 5.8 06/23/2011   Lab Results  Component Value Date   HGBA1C 5.6 06/23/2011   No results found for: VITAMINB12 Lab Results  Component Value Date   TSH 0.285 (L) 06/23/2011      ASSESSMENT AND PLAN 86 y.o. year old female  has a past medical history of Abdominal pain, Anemia, ANEMIA (06/15/2009), Aortic stenosis, Arthritis, Atrial fibrillation ([place]) (06/15/2009), Cardiac arrhythmia, Gallstones, probable biliary colic (Q000111Q), GERD (gastroesophageal reflux disease), H/O hiatal hernia, Hyperlipidemia, HYPERLIPIDEMIA (06/15/2009), Hypertension, HYPERTENSION (06/15/2009), Hyperthyroidism, Memory disorder (04/04/2016), Obesity, OBESITY (06/15/2009), Seasonal allergies, SLEEP APNEA (06/16/2009), and Unspecified hypothyroidism (06/15/2009). here with :  1.  Memory disturbance  -Memory score slightly decreased from the last visit -Continue to monitor symptoms -Discussed Exelon patch however the patient deferred  Advised if symptoms worsen or she develops new symptoms she should let us know.  She will follow-up in 6 months or sooner if needed.  I spent 15 minutes with the  patient. 50% of this time was spent reviewing plan of care for    Ward Givens, MSN, NP-C 01/29/2019, 2:57 PM [facility] [address] [place], [place] [address] [phone]

## 2019-01-29 NOTE — Patient Instructions (Signed)
Your Plan:  Continue to monitor memory If your symptoms worsen or you develop new symptoms please let us know.    Thank you for coming to see us at [facility]. I hope we have been able to provide you high quality care today.  You may receive a patient satisfaction survey over the next few weeks. We would appreciate your feedback and comments so that we may continue to improve ourselves and the health of our patients.  

## 2019-01-29 NOTE — Progress Notes (Signed)
I have read the note, and I agree with the clinical assessment and plan.  Yochanan Eddleman K Josefine Fuhr   

## 2019-01-29 NOTE — Progress Notes (Signed)
Cardiology Office Note:    Date:  01/29/2019   ID:  Erika [name], DOB 1932/01/19, MRN [id number]  PCP:  Tamsen Roers, MD  Cardiologist:  Buford Dresser, MD PhD  Referring MD: Tamsen Roers, MD   CC: follow up  History of Present Illness:    Erika [name] is a 86 y.o. female with a hx of hypertension, permanent atrial fibrillation on anticoagulation with apixaban, hypothyroidism, obesity, hyperlipidemia, TIA who is seen in follow up. She is previously a Dr. Wynonia Lawman patient..  Per Dr. Thurman Coyer note, had history of esophageal perforation with TEE in 2011. Had cardioversion in 2009 and 2011, cardiolite 2011, echocardiogram 2011 with LVEF 50%.   Today: Here with her daughter today.  Blood pressure running very high. Feels fine, denies all symptoms. Bp was even higher at neuro visit today, Q000111Q systolic. Doesn't take blood pressures routinely at home, but when she does, it has been high. Reviewed all medications today, taking as ordered. She sometimes doesn't wake up until 11 AM or so, so her AM/PM  Routine is offset, but she does take medications routinely.  Discussed options for medications, see below. Instructed on how to take BP properly and log this.  Denies chest pain, shortness of breath at rest or with normal exertion. No PND, orthopnea, LE edema or unexpected weight gain. No syncope or palpitations.   Past Medical History:  Diagnosis Date  . Abdominal pain   . Anemia   . ANEMIA 06/15/2009   Qualifier: Diagnosis of  By: Selena Batten CMA, Jewel    . Aortic stenosis    moderate AS by 06/2011 echo  . Arthritis   . Atrial fibrillation ([place]) 06/15/2009   Qualifier: Diagnosis of  By: Selena Batten CMA, Jewel    . Cardiac arrhythmia   . Gallstones, probable biliary colic Q000111Q  . GERD (gastroesophageal reflux disease)   . H/O hiatal hernia   . Hyperlipidemia   . HYPERLIPIDEMIA 06/15/2009   Qualifier: Diagnosis of  By: Selena Batten CMA, Jewel    . Hypertension   . HYPERTENSION 06/15/2009   Qualifier: Diagnosis of  By: Selena Batten CMA, Jewel    . Hyperthyroidism   . Memory disorder 04/04/2016  . Obesity   . OBESITY 06/15/2009   Qualifier: Diagnosis of  By: Selena Batten CMA, Jewel    . Seasonal allergies   . SLEEP APNEA 06/16/2009   Qualifier: Diagnosis of  By: Rayann Heman, MD, Jeneen Rinks    . Unspecified hypothyroidism 06/15/2009   Qualifier: Diagnosis of  By: Selena Batten CMA, Jewel      Past Surgical History:  Procedure Laterality Date  . BREAST SURGERY  2012 (June or July)   right  . CHOLECYSTECTOMY  06/28/2011   Procedure: LAPAROSCOPIC CHOLECYSTECTOMY WITH INTRAOPERATIVE CHOLANGIOGRAM;  Surgeon: Merrie Roof, MD;  Location: [facility];  Service: General;  Laterality: N/A;  . pharyngeal repair  [id number]   Dr Constance Holster for post-TEE pharygeal tear w neck abscess I&D  . THYROIDECTOMY  1961   80% removed    Current Medications: Current Outpatient Medications on File Prior to Visit  Medication Sig  . ELIQUIS 5 MG TABS tablet TAKE 1 TABLET BY MOUTH TWICE A DAY  . ergocalciferol (VITAMIN D2) 50000 UNITS capsule Take 50,000 Units by mouth once a week. Take on thursday  . escitalopram (LEXAPRO) 10 MG tablet TAKE 1 TABLET BY MOUTH DAILY TO HELP DEPRESSION  . furosemide (LASIX) 20 MG tablet Take 10 mg by mouth.   . levothyroxine (SYNTHROID, LEVOTHROID) 175 MCG tablet Take 175 mcg  by mouth every morning.   Marland Kitchen lisinopril (ZESTRIL) 40 MG tablet Take 1 tablet (40 mg total) by mouth daily.  Marland Kitchen omega-3 acid ethyl esters (LOVAZA) 1 G capsule Take 1 g by mouth 2 (two) times daily.   Marland Kitchen omeprazole (PRILOSEC) 20 MG capsule   . Red Yeast Rice Extract (RED YEAST RICE PO) Take 1 tablet by mouth daily.   No current facility-administered medications on file prior to visit.     Allergies:   Celecoxib, Amiodarone, Dronedarone hydrochloride, Ezetimibe, Flecainide acetate, Namenda [memantine hcl], and Sulfonamide derivatives   Social History   Tobacco Use  . Smoking status: Never Smoker  . Smokeless tobacco: Never Used    Substance Use Topics  . Alcohol use: No  . Drug use: No    Family History: The patient's family history includes Cancer in her maternal aunt; Heart disease in her father and mother. Brother with CAD, another brother with CAD and hyperlipidemia, another brother with CAD. Both father and mother deceased, both had CAD. Sister has cancer and CAD.  ROS:   Please see the history of present illness.  Additional pertinent ROS negative except as documented.  EKGs/Labs/Other Studies Reviewed:    The following studies were reviewed today: Note from Dr. Wynonia Lawman previously  EKG:  EKG is personally reviewed.  The ekg ordered 11/28/17 demonstrates atrial fibrillation with slow ventricular response, heart rate 56  Recent Labs: 02/20/2018: BUN 9; Creatinine, Ser 0.91; Potassium 4.5; Sodium 134  Recent Lipid Panel    Component Value Date/Time   CHOL 303 (H) 06/23/2011 0635   TRIG 61 06/23/2011 0635   HDL 52 06/23/2011 0635   CHOLHDL 5.8 06/23/2011 0635   VLDL 12 06/23/2011 0635   LDLCALC 239 (H) 06/23/2011 0635    Physical Exam:    VS:  BP (!) 195/74   Pulse 69   Temp 97.9 F (36.6 C)   Ht 5\' 7"  (1.702 m)   Wt 181 lb (82.1 kg)   SpO2 95%   BMI 28.35 kg/m     Wt Readings from Last 3 Encounters:  01/29/19 181 lb (82.1 kg)  01/29/19 181 lb (82.1 kg)  07/17/18 192 lb 12.8 oz (87.5 kg)    GEN: Well nourished, well developed in no acute distress HEENT: Normal, moist mucous membranes NECK: No JVD CARDIAC: irregularly irregular rhythm, normal S1 and S2, no rubs or gallops. 2/6 SE murmur, early peaking, appreciated today. VASCULAR: Radial and DP pulses 2+ bilaterally. No carotid bruits RESPIRATORY:  Clear to auscultation without rales, wheezing or rhonchi  ABDOMEN: Soft, non-tender, non-distended MUSCULOSKELETAL:  Ambulates independently SKIN: Warm and dry, trace bilateral LE edema NEUROLOGIC:  Alert and oriented x 3. No focal neuro deficits noted. PSYCHIATRIC:  Normal affect    ASSESSMENT:    1. Uncontrolled hypertension   2. Permanent atrial fibrillation ([place])   3. Bilateral leg edema   4. History of TIA (transient ischemic attack)    PLAN:    Uncontrolled hypertension: much higher than it has been at previous visits. Daughter endorses that on intermittent blood pressure checks at home it has also been elevated. -earlier today at neuro visit, BP 214/72 -here 195/74 -reviewed medications at length today.  -will increase amlodipine from 5 mg to 10 mg today -starting very low dose carvedilol. Need to be cautious given heart rate in the 60s. May not be able to titrate -already on max dose ACEi and diuretic -may need to consider spironolactone. Last K 4.5 -remained alternatives (hydralazine, clonidine) not  ideal -educated on taking blood pressure -close follow up given risk of stroke with uncontrolled blood pressure and blood thinner -counseled on red flag signs that need immediate medical attention  Atrial fibrillation, permanent: history of TIA -CHA2DS2/VAS Stroke Risk Points  = 6 (HTN, age, TIA, gender) -tolerating anticoagulation, on apixaban 5 mg BID. BMET 02/2018 with stable GFR -rate controlled on no AV nodal agents  LE edema: trace, well controlled on 10 mg lasix daily  Plan for follow up: 1 week  Medication Adjustments/Labs and Tests Ordered: Current medicines are reviewed at length with the patient today.  Concerns regarding medicines are outlined above.  No orders of the defined types were placed in this encounter.  Meds ordered this encounter  Medications  . carvedilol (COREG) 3.125 MG tablet    Sig: Take 1 tablet (3.125 mg total) by mouth 2 (two) times daily.    Dispense:  60 tablet    Refill:  11  . amLODipine (NORVASC) 10 MG tablet    Sig: Take 1 tablet (10 mg total) by mouth daily.    Dispense:  90 tablet    Refill:  3    Please wait to fill until patient calls for refill.    Patient Instructions  Medication Instructions:   Start taking amlodipine 10 mg (two of 5 mg pills). Continue taking lisinopril 40 mg daily Continue taking furosemide 10 mg (half of 20 mg pill) Start taking carvedilol 3.125 mg twice daily. *If you need a refill on your cardiac medications before your next appointment, please call your pharmacy*  Lab Work: None  If you have labs (blood work) drawn today and your tests are completely normal, you will receive your results only by: Marland Kitchen MyChart Message (if you have MyChart) OR . A paper copy in the mail If you have any lab test that is abnormal or we need to change your treatment, we will call you to review the results.  Testing/Procedures: None   Follow-Up: At [facility], you and your health needs are our priority.  As part of our continuing mission to provide you with exceptional heart care, we have created designated Provider Care Teams.  These Care Teams include your primary Cardiologist (physician) and Advanced Practice Providers (APPs -  Physician Assistants and Nurse Practitioners) who all work together to provide you with the care you need, when you need it.  Your next appointment:   1 week(s)  The format for your next appointment:   Virtual Visit   Provider:   Buford Dresser, MD  Other Instructions how to check blood pressure:  -sit comfortably in a chair, feet uncrossed and flat on floor, for 5-10 minutes  -arm ideally should rest at the level of the heart. However, arm should be relaxed and not tense (for example, do not hold the arm up unsupported)  -avoid exercise, caffeine, and tobacco for at least 30 minutes prior to BP reading  -don't take BP cuff reading over clothes (always place on skin directly)  -I prefer to know how well the medication is working, so I would like you to take your readings 1-2 hours after taking your blood pressure medication if possible    Signed, Buford Dresser, MD PhD 01/29/2019  [place]

## 2019-01-29 NOTE — Telephone Encounter (Signed)
Informed daughter if pt has a Hx of dementia, she is ok to attend appointment.

## 2019-01-29 NOTE — Patient Instructions (Addendum)
Medication Instructions:  Start taking amlodipine 10 mg (two of 5 mg pills). Continue taking lisinopril 40 mg daily Continue taking furosemide 10 mg (half of 20 mg pill) Start taking carvedilol 3.125 mg twice daily. *If you need a refill on your cardiac medications before your next appointment, please call your pharmacy*  Lab Work: None  If you have labs (blood work) drawn today and your tests are completely normal, you will receive your results only by: Marland Kitchen MyChart Message (if you have MyChart) OR . A paper copy in the mail If you have any lab test that is abnormal or we need to change your treatment, we will call you to review the results.  Testing/Procedures: None   Follow-Up: At [facility], you and your health needs are our priority.  As part of our continuing mission to provide you with exceptional heart care, we have created designated Provider Care Teams.  These Care Teams include your primary Cardiologist (physician) and Advanced Practice Providers (APPs -  Physician Assistants and Nurse Practitioners) who all work together to provide you with the care you need, when you need it.  Your next appointment:   1 week(s)  The format for your next appointment:   Virtual Visit   Provider:   Buford Dresser, MD  Other Instructions how to check blood pressure:  -sit comfortably in a chair, feet uncrossed and flat on floor, for 5-10 minutes  -arm ideally should rest at the level of the heart. However, arm should be relaxed and not tense (for example, do not hold the arm up unsupported)  -avoid exercise, caffeine, and tobacco for at least 30 minutes prior to BP reading  -don't take BP cuff reading over clothes (always place on skin directly)  -I prefer to know how well the medication is working, so I would like you to take your readings 1-2 hours after taking your blood pressure medication if possible

## 2019-02-05 ENCOUNTER — Telehealth (INDEPENDENT_AMBULATORY_CARE_PROVIDER_SITE_OTHER): Payer: Medicare Other | Admitting: Cardiology

## 2019-02-05 ENCOUNTER — Encounter: Payer: Self-pay | Admitting: Cardiology

## 2019-02-05 VITALS — BP 153/61 | HR 42

## 2019-02-05 DIAGNOSIS — I10 Essential (primary) hypertension: Secondary | ICD-10-CM | POA: Diagnosis not present

## 2019-02-05 DIAGNOSIS — Z79899 Other long term (current) drug therapy: Secondary | ICD-10-CM

## 2019-02-05 NOTE — Patient Instructions (Signed)
Medication Instructions:  Stop: Carvedilol   *If you need a refill on your cardiac medications before your next appointment, please call your pharmacy*  Lab Work: Your physician recommends that you return for lab work (BMP).  If you have labs (blood work) drawn today and your tests are completely normal, you will receive your results only by: Marland Kitchen MyChart Message (if you have MyChart) OR . A paper copy in the mail If you have any lab test that is abnormal or we need to change your treatment, we will call you to review the results.  Testing/Procedures: None  Follow-Up: At [facility], you and your health needs are our priority.  As part of our continuing mission to provide you with exceptional heart care, we have created designated Provider Care Teams.  These Care Teams include your primary Cardiologist (physician) and Advanced Practice Providers (APPs -  Physician Assistants and Nurse Practitioners) who all work together to provide you with the care you need, when you need it.  Your next appointment:   3-4 week(s)  The format for your next appointment:   Virtual Visit   Provider:   Buford Dresser, MD

## 2019-02-05 NOTE — Progress Notes (Signed)
Virtual Visit via Video Note   This visit type was conducted due to national recommendations for restrictions regarding the COVID-19 Pandemic (e.g. social distancing) in an effort to limit this patient's exposure and mitigate transmission in our community.  Due to her co-morbid illnesses, this patient is at least at moderate risk for complications without adequate follow up.  This format is felt to be most appropriate for this patient at this time.  All issues noted in this document were discussed and addressed.  A limited physical exam was performed with this format.  Please refer to the patient's chart for her consent to telehealth for [facility].   Date:  02/05/2019   ID:  Erika [name], DOB [birth year], MRN [id number]  Patient Location: Home Provider Location: Office  PCP:  Tamsen Roers, MD  Cardiologist:  Buford Dresser, MD  Electrophysiologist:  None   Evaluation Performed:  Follow-Up Visit  Chief Complaint:  Follow up  History of Present Illness:    Erika [name] is a 86 y.o. female with a hx of hypertension, permanent atrial fibrillation on anticoagulation with apixaban, hypothyroidism, obesity, hyperlipidemia, TIA who is seen in follow up.  The patient does not have symptoms concerning for COVID-19 infection (fever, chills, cough, or new shortness of breath).   Today: Close follow up given visit last week with uncontrolled hypertension. Increased amlodipine and started low dose carvedilol. Daughter has been assisting.  BP log: 1/22 192/67 hr 46 1/23 184/74 hr 49 1/24 167/68 hr 48 1/25 184/71 hr 49 1/26 155/63 hr 44 1/27 147/66 hr 40 didn't get pm carvedilol 1/28 153/61 hr 42  She denies any lightheadedness or presyncope. Discussed that with low heart rate, we can't continue the carvedilol and will need alternative.  Denies chest pain, shortness of breath at rest or with normal exertion. No PND, orthopnea, LE edema or unexpected weight gain. No syncope or  palpitations.   Past Medical History:  Diagnosis Date  . Abdominal pain   . Anemia   . ANEMIA 06/15/2009   Qualifier: Diagnosis of  By: Selena Batten CMA, Jewel    . Aortic stenosis    moderate AS by 06/2011 echo  . Arthritis   . Atrial fibrillation ([place]) 06/15/2009   Qualifier: Diagnosis of  By: Selena Batten CMA, Jewel    . Cardiac arrhythmia   . Gallstones, probable biliary colic Q000111Q  . GERD (gastroesophageal reflux disease)   . H/O hiatal hernia   . Hyperlipidemia   . HYPERLIPIDEMIA 06/15/2009   Qualifier: Diagnosis of  By: Selena Batten CMA, Jewel    . Hypertension   . HYPERTENSION 06/15/2009   Qualifier: Diagnosis of  By: Selena Batten CMA, Jewel    . Hyperthyroidism   . Memory disorder 04/04/2016  . Obesity   . OBESITY 06/15/2009   Qualifier: Diagnosis of  By: Selena Batten CMA, Jewel    . Seasonal allergies   . SLEEP APNEA 06/16/2009   Qualifier: Diagnosis of  By: Rayann Heman, MD, Jeneen Rinks    . Unspecified hypothyroidism 06/15/2009   Qualifier: Diagnosis of  By: Selena Batten CMA, Jewel     Past Surgical History:  Procedure Laterality Date  . BREAST SURGERY  2012 (June or July)   right  . CHOLECYSTECTOMY  06/28/2011   Procedure: LAPAROSCOPIC CHOLECYSTECTOMY WITH INTRAOPERATIVE CHOLANGIOGRAM;  Surgeon: Merrie Roof, MD;  Location: [facility];  Service: General;  Laterality: N/A;  . pharyngeal repair  [id number]   Dr Constance Holster for post-TEE pharygeal tear w neck abscess I&D  . THYROIDECTOMY  1961  80% removed     Current Meds  Medication Sig  . amLODipine (NORVASC) 10 MG tablet Take 1 tablet (10 mg total) by mouth daily.  Marland Kitchen ELIQUIS 5 MG TABS tablet TAKE 1 TABLET BY MOUTH TWICE A DAY  . ergocalciferol (VITAMIN D2) 50000 UNITS capsule Take 50,000 Units by mouth once a week. Take on thursday  . escitalopram (LEXAPRO) 10 MG tablet TAKE 1 TABLET BY MOUTH DAILY TO HELP DEPRESSION  . furosemide (LASIX) 20 MG tablet Take 10 mg by mouth.   . levothyroxine (SYNTHROID, LEVOTHROID) 175 MCG tablet Take 175 mcg by mouth every morning.   Marland Kitchen  lisinopril (ZESTRIL) 40 MG tablet Take 1 tablet (40 mg total) by mouth daily.  Marland Kitchen omega-3 acid ethyl esters (LOVAZA) 1 G capsule Take 1 g by mouth daily.   Marland Kitchen omeprazole (PRILOSEC) 20 MG capsule daily as needed.   . Red Yeast Rice Extract (RED YEAST RICE PO) Take 1 tablet by mouth daily.  . [DISCONTINUED] carvedilol (COREG) 3.125 MG tablet Take 1 tablet (3.125 mg total) by mouth 2 (two) times daily.     Allergies:   Celecoxib, Amiodarone, Dronedarone hydrochloride, Ezetimibe, Flecainide acetate, Namenda [memantine hcl], and Sulfonamide derivatives   Social History   Tobacco Use  . Smoking status: Never Smoker  . Smokeless tobacco: Never Used  Substance Use Topics  . Alcohol use: No  . Drug use: No     Family Hx: The patient's family history includes Cancer in her maternal aunt; Heart disease in her father and mother.  ROS:   Please see the history of present illness.    All other systems reviewed and are negative.   Prior CV studies:   The following studies were reviewed today: No new since last visit  Labs/Other Tests and Data Reviewed:    EKG:  An ECG dated 11/28/17 was personally reviewed today and demonstrated:  afib with slow response, HR 56  Recent Labs: 02/20/2018: BUN 9; Creatinine, Ser 0.91; Potassium 4.5; Sodium 134   Recent Lipid Panel Lab Results  Component Value Date/Time   CHOL 303 (H) 06/23/2011 06:35 AM   TRIG 61 06/23/2011 06:35 AM   HDL 52 06/23/2011 06:35 AM   CHOLHDL 5.8 06/23/2011 06:35 AM   LDLCALC 239 (H) 06/23/2011 06:35 AM    Wt Readings from Last 3 Encounters:  01/29/19 181 lb (82.1 kg)  01/29/19 181 lb (82.1 kg)  07/17/18 192 lb 12.8 oz (87.5 kg)     Objective:    Vital Signs:  BP (!) 153/61   Pulse (!) 42    In no acute distress Alert and oriented  ASSESSMENT & PLAN:    Hypertension: -improving control compared to last week, when BP was 214/72 at neurology appt -continue amlodipine 10 mg daily -HR too low to continue  carvedilol. Will stop today -already on max dose ACEi and diuretic -may need to consider spironolactone. Will get baseline labs next week. If acceptable Cr/K, start spironolactone, follow up repeat BMET in 2 weeks. -remaining alternatives (hydralazine, clonidine) not ideal. We discussed these today -continue taking daily blood pressures -counseled on red flag signs that need immediate medical attention  COVID-19 Education: The signs and symptoms of COVID-19 were discussed with the patient and how to seek care for testing (follow up with PCP or arrange E-visit).  The importance of social distancing was discussed today.  Time:   Today, I have spent 18 minutes face to face via video with the patient with telehealth technology discussing  the above problems.  Total time including documentation 23 minutes.   Medication Adjustments/Labs and Tests Ordered: Current medicines are reviewed at length with the patient today.  Concerns regarding medicines are outlined above.   Tests Ordered: Orders Placed This Encounter  Procedures  . Basic metabolic panel   Patient Instructions  Medication Instructions:  Stop: Carvedilol   *If you need a refill on your cardiac medications before your next appointment, please call your pharmacy*  Lab Work: Your physician recommends that you return for lab work (BMP).  If you have labs (blood work) drawn today and your tests are completely normal, you will receive your results only by: Marland Kitchen MyChart Message (if you have MyChart) OR . A paper copy in the mail If you have any lab test that is abnormal or we need to change your treatment, we will call you to review the results.  Testing/Procedures: None  Follow-Up: At [facility], you and your health needs are our priority.  As part of our continuing mission to provide you with exceptional heart care, we have created designated Provider Care Teams.  These Care Teams include your primary Cardiologist (physician)  and Advanced Practice Providers (APPs -  Physician Assistants and Nurse Practitioners) who all work together to provide you with the care you need, when you need it.  Your next appointment:   3-4 week(s)  The format for your next appointment:   Virtual Visit   Provider:   Buford Dresser, MD    Signed, Buford Dresser, MD  02/05/2019 6:09 PM    [place]

## 2019-02-13 LAB — BASIC METABOLIC PANEL
BUN/Creatinine Ratio: 14 (ref 12–28)
BUN: 14 mg/dL (ref 8–27)
CO2: 24 mmol/L (ref 20–29)
Calcium: 9.8 mg/dL (ref 8.7–10.3)
Chloride: 93 mmol/L — ABNORMAL LOW (ref 96–106)
Creatinine, Ser: 1.03 mg/dL — ABNORMAL HIGH (ref 0.57–1.00)
GFR calc Af Amer: 58 mL/min/{1.73_m2} — ABNORMAL LOW (ref >59)
GFR calc non Af Amer: 50 mL/min/{1.73_m2} — ABNORMAL LOW (ref >59)
Glucose: 94 mg/dL (ref 65–99)
Potassium: 5.2 mmol/L (ref 3.5–5.2)
Sodium: 132 mmol/L — ABNORMAL LOW (ref 134–144)

## 2019-02-17 ENCOUNTER — Other Ambulatory Visit: Payer: Self-pay

## 2019-02-17 MED ORDER — HYDRALAZINE HCL 10 MG PO TABS: 10.0000 mg | ORAL_TABLET | Freq: Three times a day (TID) | ORAL | 11 refills | Status: DC

## 2019-02-17 NOTE — Progress Notes (Signed)
We have limited options left--we are down to hydralazine and clonidine. We will start with hydralazine as it is less risky. We can start with 10 mg hydralazine three times a day. It's best if they can do it three times daily, but even twice a day may make an improvement. That is a low dose and we can go up, but I would start there to make sure she tolerates it. We can then adjust it at her followup visit. Thanks.

## 2019-03-11 ENCOUNTER — Other Ambulatory Visit: Payer: Self-pay | Admitting: Cardiology

## 2019-03-12 ENCOUNTER — Ambulatory Visit (INDEPENDENT_AMBULATORY_CARE_PROVIDER_SITE_OTHER): Payer: Medicare Other | Admitting: Cardiology

## 2019-03-12 ENCOUNTER — Encounter: Payer: Self-pay | Admitting: Cardiology

## 2019-03-12 ENCOUNTER — Other Ambulatory Visit: Payer: Self-pay

## 2019-03-12 VITALS — BP 150/65 | HR 55 | Temp 97.2°F | Ht 67.0 in | Wt 179.0 lb

## 2019-03-12 DIAGNOSIS — R062 Wheezing: Secondary | ICD-10-CM

## 2019-03-12 DIAGNOSIS — I4821 Permanent atrial fibrillation: Secondary | ICD-10-CM

## 2019-03-12 DIAGNOSIS — Z79899 Other long term (current) drug therapy: Secondary | ICD-10-CM | POA: Diagnosis not present

## 2019-03-12 DIAGNOSIS — I10 Essential (primary) hypertension: Secondary | ICD-10-CM

## 2019-03-12 NOTE — Patient Instructions (Addendum)
Medication Instructions:  We will try to gradually increase the hydralazine to get to a good number. Long term average goal is 140/90. I want the top number to stay above 110 as much as possible. We also want to avoid lightheadedness.   What I recommend: Start increasing the hydralazine gradually. Increase to 20 mg (2 pills) twice a day for a week. Then if not at goal (and not too low), increase to 30 mg twice a day for a week. You can continue to go up by 10 mg each dose once a week as long as she is not having low numbers or lightheadedness. If noticed Blood pressure running low, you can decrease to previous dose.  *If you need a refill on your cardiac medications before your next appointment, please call your pharmacy*   Lab Work: None   Testing/Procedures: None   Follow-Up: At [facility], you and your health needs are our priority.  As part of our continuing mission to provide you with exceptional heart care, we have created designated Provider Care Teams.  These Care Teams include your primary Cardiologist (physician) and Advanced Practice Providers (APPs -  Physician Assistants and Nurse Practitioners) who all work together to provide you with the care you need, when you need it.  We recommend signing up for the patient portal called "MyChart".  Sign up information is provided on this After Visit Summary.  MyChart is used to connect with patients for Virtual Visits (Telemedicine).  Patients are able to view lab/test results, encounter notes, upcoming appointments, etc.  Non-urgent messages can be sent to your provider as well.   To learn more about what you can do with MyChart, go to [email or web address].    Your next appointment:   4 week(s)  The format for your next appointment:   Virtual Visit   Provider:   Buford Dresser, MD

## 2019-03-12 NOTE — Progress Notes (Signed)
Cardiology Office Note:    Date:  03/12/2019   ID:  Erika [name], DOB 12/12/1932, MRN [id number]  PCP:  Erika Roers, MD  Cardiologist:  Erika Dresser, MD PhD  Referring MD: Erika Roers, MD   CC: follow up  History of Present Illness:    Erika [name] is a 86 y.o. female with a hx of hypertension, permanent atrial fibrillation on anticoagulation with apixaban, hypothyroidism, obesity, hyperlipidemia, TIA who is seen in follow up. She is previously a Erika. Wynonia [name] patient..  Per Erika. Thurman [name] note, had history of esophageal perforation with TEE in 2011. Had cardioversion in 2009 and 2011, cardiolite 2011, echocardiogram 2011 with LVEF 50%.   Today: Here for hypertension follow up with her daughter today. See last visit note from 02/05/19. Limited options remaining for BP control.   Sleeps a lot per the daughter, patient denies. Daughter feels she is napping more during the day. Blood pressure have been ranging from 147/66-181/61, HR 40-50s. No syncope, no dizziness. Getting in hydralazine about twice a day on average.   Denies chest pain, shortness of breath at rest or with normal exertion. No PND, orthopnea, LE edema or unexpected weight gain. No syncope or palpitations.  Past Medical History:  Diagnosis Date  . Abdominal pain   . Anemia   . ANEMIA 06/15/2009   Qualifier: Diagnosis of  By: Erika [name]    . Aortic stenosis    moderate AS by 06/2011 echo  . Arthritis   . Atrial fibrillation ([place]) 06/15/2009   Qualifier: Diagnosis of  By: Erika [name]    . Cardiac arrhythmia   . Gallstones, probable biliary colic [id number]  . GERD (gastroesophageal reflux disease)   . H/O hiatal hernia   . Hyperlipidemia   . HYPERLIPIDEMIA 06/15/2009   Qualifier: Diagnosis of  By: Erika [name]    . Hypertension   . HYPERTENSION 06/15/2009   Qualifier: Diagnosis of  By: Erika [name]    . Hyperthyroidism   . Memory disorder 04/04/2016  . Obesity   . OBESITY 06/15/2009   Qualifier: Diagnosis of  By: Erika [name]    . Seasonal allergies   . SLEEP APNEA 06/16/2009   Qualifier: Diagnosis of  By: Erika Heman, MD, Jeneen Rinks    . Unspecified hypothyroidism 06/15/2009   Qualifier: Diagnosis of  By: Erika [name]      Past Surgical History:  Procedure Laterality Date  . BREAST SURGERY  2012 (June or July)   right  . CHOLECYSTECTOMY  06/28/2011   Procedure: LAPAROSCOPIC CHOLECYSTECTOMY WITH INTRAOPERATIVE CHOLANGIOGRAM;  Surgeon: Merrie Roof, MD;  Location: [facility];  Service: General;  Laterality: N/A;  . pharyngeal repair  [id number]   Erika [name] for post-TEE pharygeal tear w neck abscess I&D  . THYROIDECTOMY  1961   80% removed    Current Medications: Current Outpatient Medications on File Prior to Visit  Medication Sig  . amLODipine (NORVASC) 10 MG tablet Take 1 tablet (10 mg total) by mouth daily.  Marland Kitchen ELIQUIS 5 MG TABS tablet TAKE 1 TABLET BY MOUTH TWICE A DAY  . ergocalciferol (VITAMIN D2) 50000 UNITS capsule Take 50,000 Units by mouth once a week. Take on thursday  . escitalopram (LEXAPRO) 10 MG tablet TAKE 1 TABLET BY MOUTH DAILY TO HELP DEPRESSION  . furosemide (LASIX) 20 MG tablet Take 10 mg by mouth.   . hydrALAZINE (APRESOLINE) 10 MG tablet Take 1 tablet (10 mg total) by mouth 3 (three) times daily.  Marland Kitchen  levothyroxine (SYNTHROID, LEVOTHROID) 175 MCG tablet Take 175 mcg by mouth every morning.   Marland Kitchen lisinopril (ZESTRIL) 40 MG tablet TAKE 1 TABLET BY MOUTH EVERY DAY  . omega-3 acid ethyl esters (LOVAZA) 1 G capsule Take 1 g by mouth daily.   Marland Kitchen omeprazole (PRILOSEC) 20 MG capsule daily as needed.   . Red Yeast Rice Extract (RED YEAST RICE PO) Take 1 tablet by mouth daily.   No current facility-administered medications on file prior to visit.     Allergies:   Celecoxib, Amiodarone, Dronedarone hydrochloride, Ezetimibe, Flecainide acetate, Namenda [memantine hcl], and Sulfonamide derivatives   Social History   Tobacco Use  . Smoking status: Never Smoker  .  Smokeless tobacco: Never Used  Substance Use Topics  . Alcohol use: No  . Drug use: No    Family History: The patient's family history includes Cancer in her maternal aunt; Heart disease in her father and mother. Brother with CAD, another brother with CAD and hyperlipidemia, another brother with CAD. Both father and mother deceased, both had CAD. Sister has cancer and CAD.  ROS:   Please see the history of present illness.  Additional pertinent ROS negative except as documented.  EKGs/Labs/Other Studies Reviewed:    The following studies were reviewed today: Notes/studies from Erika. Wynonia [name] previously  EKG:  EKG is personally reviewed.  The ekg ordered today demonstrates atrial fibrillation with slow ventricular response, heart rate 55 bpm  Recent Labs: 02/12/2019: BUN 14; Creatinine, Ser 1.03; Potassium 5.2; Sodium 132  Recent Lipid Panel    Component Value Date/Time   CHOL 303 (H) 06/23/2011 0635   TRIG 61 06/23/2011 0635   HDL 52 06/23/2011 0635   CHOLHDL 5.8 06/23/2011 0635   VLDL 12 06/23/2011 0635   LDLCALC 239 (H) 06/23/2011 0635    Physical Exam:    VS:  BP (!) 150/65   Pulse (!) 55   Temp (!) 97.2 F (36.2 C)   Ht 5\' 7"  (1.702 m)   Wt 179 lb (81.2 kg)   SpO2 95%   BMI 28.04 kg/m     Wt Readings from Last 3 Encounters:  03/12/19 179 lb (81.2 kg)  01/29/19 181 lb (82.1 kg)  01/29/19 181 lb (82.1 kg)    GEN: Well nourished, well developed in no acute distress HEENT: Normal, moist mucous membranes NECK: No JVD CARDIAC: irregularly irregular rhythm, normal S1 and S2, no rubs or gallops. 2/6 SEM, early peaking.  VASCULAR: Radial and DP pulses 2+ bilaterally. RESPIRATORY:  Clear to auscultation, but there are fine expiratory wheezes throughout the upper airway fields ABDOMEN: Soft, non-tender, non-distended MUSCULOSKELETAL:  Ambulates independently SKIN: Warm and dry, trace nonpitting bilateral edema NEUROLOGIC:  Alert and oriented x 3. No focal neuro deficits  noted. PSYCHIATRIC:  Normal affect    ASSESSMENT:    1. Essential hypertension   2. Permanent atrial fibrillation ([place])   3. Medication management   4. Wheeze    PLAN:    hypertension: difficult to control -earlier today at neuro visit, BP 214/72 -continue amlodipine 10 mg daily -had to stop carvedilol due to bradycardia. -continue lisinopril 40 mg daily -continue furosemide 10 mg daily -K was 5.2, unable to start spironolactone -started 10 mg hydralazine, ideally TID but at least BID, after last visit. We discussed a slow ramp up of hydralazine dosing: Start increasing the hydralazine gradually. Increase to 20 mg (2 pills) twice a day for a week. Then if not at goal (and not too low), increase to  30 mg twice a day for a week. You can continue to go up by 10 mg each dose once a week as long as she is not having low numbers or lightheadedness. If noticed Blood pressure running low, you can decrease to previous dose. -continue taking daily blood pressures -counseled on red flag signs that need immediate medical attention  Wheeze: fine expiratory wheeze, diffuse -daughter reports a history of spring allergies. They will try allergy medication, and if it does not improve will speak to PCP  Atrial fibrillation, permanent: history of TIA -CHA2DS2/VAS Stroke Risk Points  = 6 (HTN, age, TIA, gender) -tolerating anticoagulation, on apixaban 5 mg BID.  -rate controlled on no AV nodal agents, was bradycardic with low dose carvedilol  LE edema: trace, well controlled on 10 mg lasix daily  Plan for follow up: 4 weeks  Medication Adjustments/Labs and Tests Ordered: Current medicines are reviewed at length with the patient today.  Concerns regarding medicines are outlined above.  Orders Placed This Encounter  Procedures  . EKG 12-Lead   No orders of the defined types were placed in this encounter.   Patient Instructions  Medication Instructions:  We will try to gradually increase the  hydralazine to get to a good number. Long term average goal is 140/90. I want the top number to stay above 110 as much as possible. We also want to avoid lightheadedness.   What I recommend: Start increasing the hydralazine gradually. Increase to 20 mg (2 pills) twice a day for a week. Then if not at goal (and not too low), increase to 30 mg twice a day for a week. You can continue to go up by 10 mg each dose once a week as long as she is not having low numbers or lightheadedness. If noticed Blood pressure running low, you can decrease to previous dose.  *If you need a refill on your cardiac medications before your next appointment, please call your pharmacy*   Lab Work: None   Testing/Procedures: None   Follow-Up: At [facility], you and your health needs are our priority.  As part of our continuing mission to provide you with exceptional heart care, we have created designated Provider Care Teams.  These Care Teams include your primary Cardiologist (physician) and Advanced Practice Providers (APPs -  Physician Assistants and Nurse Practitioners) who all work together to provide you with the care you need, when you need it.  We recommend signing up for the patient portal called "MyChart".  Sign up information is provided on this After Visit Summary.  MyChart is used to connect with patients for Virtual Visits (Telemedicine).  Patients are able to view lab/test results, encounter notes, upcoming appointments, etc.  Non-urgent messages can be sent to your provider as well.   To learn more about what you can do with MyChart, go to [email or web address].    Your next appointment:   4 week(s)  The format for your next appointment:   Virtual Visit   Provider:   Buford Dresser, MD      Signed, Erika Dresser, MD PhD 03/12/2019  [place]

## 2019-04-08 ENCOUNTER — Telehealth (INDEPENDENT_AMBULATORY_CARE_PROVIDER_SITE_OTHER): Payer: Medicare Other | Admitting: Cardiology

## 2019-04-08 VITALS — BP 162/64 | HR 58 | Ht 67.0 in | Wt 189.0 lb

## 2019-04-08 DIAGNOSIS — R600 Localized edema: Secondary | ICD-10-CM

## 2019-04-08 DIAGNOSIS — Z79899 Other long term (current) drug therapy: Secondary | ICD-10-CM

## 2019-04-08 DIAGNOSIS — R001 Bradycardia, unspecified: Secondary | ICD-10-CM

## 2019-04-08 DIAGNOSIS — I10 Essential (primary) hypertension: Secondary | ICD-10-CM

## 2019-04-08 DIAGNOSIS — Z7189 Other specified counseling: Secondary | ICD-10-CM

## 2019-04-08 DIAGNOSIS — I4821 Permanent atrial fibrillation: Secondary | ICD-10-CM | POA: Diagnosis not present

## 2019-04-08 DIAGNOSIS — T447X5A Adverse effect of beta-adrenoreceptor antagonists, initial encounter: Secondary | ICD-10-CM

## 2019-04-08 MED ORDER — HYDRALAZINE HCL 50 MG PO TABS: 50.0000 mg | ORAL_TABLET | Freq: Two times a day (BID) | ORAL | 3 refills | Status: DC

## 2019-04-08 NOTE — Patient Instructions (Signed)
Medication Instructions:  Increase Hydralazine to 50 mg twice a day. May take additional 10 mg as needed for systolic (top number) greater than 180.  *If you need a refill on your cardiac medications before your next appointment, please call your pharmacy*   Lab Work: None   Testing/Procedures: None   Follow-Up: At [facility], you and your health needs are our priority.  As part of our continuing mission to provide you with exceptional heart care, we have created designated Provider Care Teams.  These Care Teams include your primary Cardiologist (physician) and Advanced Practice Providers (APPs -  Physician Assistants and Nurse Practitioners) who all work together to provide you with the care you need, when you need it.  We recommend signing up for the patient portal called "MyChart".  Sign up information is provided on this After Visit Summary.  MyChart is used to connect with patients for Virtual Visits (Telemedicine).  Patients are able to view lab/test results, encounter notes, upcoming appointments, etc.  Non-urgent messages can be sent to your provider as well.   To learn more about what you can do with MyChart, go to [email or web address].    Your next appointment:   6 week(s)  The format for your next appointment:   Either In Person or Virtual  Provider:   Buford Dresser, MD

## 2019-04-08 NOTE — Progress Notes (Signed)
Virtual Visit via Telephone Note   This visit type was conducted due to national recommendations for restrictions regarding the COVID-19 Pandemic (e.g. social distancing) in an effort to limit this patient's exposure and mitigate transmission in our community.  Due to her co-morbid illnesses, this patient is at least at moderate risk for complications without adequate follow up.  This format is felt to be most appropriate for this patient at this time.  The patient did not have access to video technology/had technical difficulties with video requiring transitioning to audio format only (telephone).  All issues noted in this document were discussed and addressed.  No physical exam could be performed with this format.  Please refer to the patient's chart for her  consent to telehealth for [facility].   The patient was identified using 2 identifiers.  Date:  04/08/2019   ID:  Erika [name], DOB 1932-03-26, MRN [id number]  Patient Location: Home Provider Location: Home  PCP:  Tamsen Roers, MD  Cardiologist:  Buford Dresser, MD  Electrophysiologist:  None   Evaluation Performed:  Follow-Up Visit  Chief Complaint:  Follow up  History of Present Illness:    Erika [name] is a 86 y.o. female with a hx of hypertension, permanent atrial fibrillation on anticoagulation with apixaban, hypothyroidism, obesity, hyperlipidemia, TIA who is seen in follow up. She is previously a Dr. Wynonia Lawman patient..  Per Dr. Thurman Coyer note, had history of esophageal perforation with TEE in 2011. Had cardioversion in 2009 and 2011, cardiolite 2011, echocardiogram 2011 with LVEF 50%.   The patient does not have symptoms concerning for COVID-19 infection (fever, chills, cough, or new shortness of breath).   Today: Daughter assisting with call today.  Up to 50 mg hydralazine total every day: 30 mg in the AM and 20 mg in the PM after dinner. Continuing gradual titration.  BP 160s/60s now   Wheezing improved  on allergy medication  Denies chest pain, shortness of breath at rest or with normal exertion. No PND, orthopnea, LE edema or unexpected weight gain. No syncope or palpitations.  Past Medical History:  Diagnosis Date  . Abdominal pain   . Anemia   . ANEMIA 06/15/2009   Qualifier: Diagnosis of  By: Selena Batten CMA, Jewel    . Aortic stenosis    moderate AS by 06/2011 echo  . Arthritis   . Atrial fibrillation ([place]) 06/15/2009   Qualifier: Diagnosis of  By: Selena Batten CMA, Jewel    . Cardiac arrhythmia   . Gallstones, probable biliary colic Q000111Q  . GERD (gastroesophageal reflux disease)   . H/O hiatal hernia   . Hyperlipidemia   . HYPERLIPIDEMIA 06/15/2009   Qualifier: Diagnosis of  By: Selena Batten CMA, Jewel    . Hypertension   . HYPERTENSION 06/15/2009   Qualifier: Diagnosis of  By: Selena Batten CMA, Jewel    . Hyperthyroidism   . Memory disorder 04/04/2016  . Obesity   . OBESITY 06/15/2009   Qualifier: Diagnosis of  By: Selena Batten CMA, Jewel    . Seasonal allergies   . SLEEP APNEA 06/16/2009   Qualifier: Diagnosis of  By: Rayann Heman, MD, Jeneen Rinks    . Unspecified hypothyroidism 06/15/2009   Qualifier: Diagnosis of  By: Selena Batten CMA, Jewel     Past Surgical History:  Procedure Laterality Date  . BREAST SURGERY  2012 (June or July)   right  . CHOLECYSTECTOMY  06/28/2011   Procedure: LAPAROSCOPIC CHOLECYSTECTOMY WITH INTRAOPERATIVE CHOLANGIOGRAM;  Surgeon: Merrie Roof, MD;  Location: [facility];  Service:  General;  Laterality: N/A;  . pharyngeal repair  [id number]   Dr Constance Holster for post-TEE pharygeal tear w neck abscess I&D  . THYROIDECTOMY  1961   80% removed     Current Meds  Medication Sig  . amLODipine (NORVASC) 10 MG tablet Take 1 tablet (10 mg total) by mouth daily.  Marland Kitchen ELIQUIS 5 MG TABS tablet TAKE 1 TABLET BY MOUTH TWICE A DAY  . ergocalciferol (VITAMIN D2) 50000 UNITS capsule Take 50,000 Units by mouth once a week. Take on thursday  . escitalopram (LEXAPRO) 10 MG tablet TAKE 1 TABLET BY MOUTH DAILY TO HELP  DEPRESSION  . furosemide (LASIX) 20 MG tablet Take 10 mg by mouth.   . hydrALAZINE (APRESOLINE) 10 MG tablet Take 30 mg by mouth daily. 20 mg every evening  . levothyroxine (SYNTHROID, LEVOTHROID) 175 MCG tablet Take 175 mcg by mouth every morning.   Marland Kitchen lisinopril (ZESTRIL) 40 MG tablet TAKE 1 TABLET BY MOUTH EVERY DAY  . loratadine (CLARITIN) 10 MG tablet Take 10 mg by mouth daily as needed for allergies.  Marland Kitchen omega-3 acid ethyl esters (LOVAZA) 1 G capsule Take 1 g by mouth daily.   Marland Kitchen omeprazole (PRILOSEC) 20 MG capsule Take 20 mg by mouth daily.  . Red Yeast Rice Extract (RED YEAST RICE PO) Take 1 tablet by mouth daily.  . [DISCONTINUED] omeprazole (PRILOSEC) 20 MG capsule daily as needed.      Allergies:   Celecoxib, Amiodarone, Dronedarone hydrochloride, Ezetimibe, Flecainide acetate, Namenda [memantine hcl], and Sulfonamide derivatives   Social History   Tobacco Use  . Smoking status: Never Smoker  . Smokeless tobacco: Never Used  Substance Use Topics  . Alcohol use: No  . Drug use: No     Family Hx: The patient's family history includes Cancer in her maternal aunt; Heart disease in her father and mother.  ROS:   Please see the history of present illness.    All other systems reviewed and are negative.   Prior CV studies:   The following studies were reviewed today:  No new  Labs/Other Tests and Data Reviewed:    EKG:  An ECG dated 03/12/19 was personally reviewed today and demonstrated:  afib with slow ventricular response  Recent Labs: 02/12/2019: BUN 14; Creatinine, Ser 1.03; Potassium 5.2; Sodium 132   Recent Lipid Panel Lab Results  Component Value Date/Time   CHOL 303 (H) 06/23/2011 06:35 AM   TRIG 61 06/23/2011 06:35 AM   HDL 52 06/23/2011 06:35 AM   CHOLHDL 5.8 06/23/2011 06:35 AM   LDLCALC 239 (H) 06/23/2011 06:35 AM    Wt Readings from Last 3 Encounters:  04/08/19 189 lb (85.7 kg)  03/12/19 179 lb (81.2 kg)  01/29/19 181 lb (82.1 kg)     Objective:     Vital Signs:  BP (!) 162/64   Pulse (!) 58   Ht 5\' 7"  (1.702 m)   Wt 189 lb (85.7 kg)   BMI 29.60 kg/m    Speaking comfortably on the phone, no audible wheezing In no acute distress Alert and oriented Normal affect Normal speech  ASSESSMENT & PLAN:    hypertension: difficult to control -continue amlodipine 10 mg daily -had to stop carvedilol due to bradycardia. -continue lisinopril 40 mg daily -continue furosemide 10 mg daily -K was 5.2, unable to start spironolactone -ramping hydralazine. Goal is 50 mg BID, can change to this pill dose when she gets there. -ok to use 10 mg PRN hydralazine for elevated systolic blood  pressures -continue taking daily blood pressures -counseled on red flag signs that need immediate medical attention  Wheeze: resolved on allergy medications  Atrial fibrillation, permanent: history of TIA -CHA2DS2/VAS Stroke Risk Points  = 6 (HTN, age, TIA, gender) -tolerating anticoagulation, on apixaban 5 mg BID.  -rate controlled on no AV nodal agents, was bradycardic with low dose carvedilol  LE edema: trace, well controlled on 10 mg lasix daily  COVID-19 Education: The signs and symptoms of COVID-19 were discussed with the patient and how to seek care for testing (follow up with PCP or arrange E-visit).  The importance of social distancing was discussed today.  Time:   Today, I have spent 17 minutes with the patient with telehealth technology discussing the above problems.    Patient Instructions  Medication Instructions:  Increase Hydralazine to 50 mg twice a day. May take additional 10 mg as needed for systolic (top number) greater than 180.  *If you need a refill on your cardiac medications before your next appointment, please call your pharmacy*   Lab Work: None   Testing/Procedures: None   Follow-Up: At [facility], you and your health needs are our priority.  As part of our continuing mission to provide you with exceptional  heart care, we have created designated Provider Care Teams.  These Care Teams include your primary Cardiologist (physician) and Advanced Practice Providers (APPs -  Physician Assistants and Nurse Practitioners) who all work together to provide you with the care you need, when you need it.  We recommend signing up for the patient portal called "MyChart".  Sign up information is provided on this After Visit Summary.  MyChart is used to connect with patients for Virtual Visits (Telemedicine).  Patients are able to view lab/test results, encounter notes, upcoming appointments, etc.  Non-urgent messages can be sent to your provider as well.   To learn more about what you can do with MyChart, go to [email or web address].    Your next appointment:   6 week(s)  The format for your next appointment:   Either In Person or Virtual  Provider:   Buford Dresser, MD       Signed, Buford Dresser, MD  04/08/2019 9:45 AM    [place]

## 2019-04-10 ENCOUNTER — Other Ambulatory Visit: Payer: Self-pay | Admitting: Cardiology

## 2019-05-18 ENCOUNTER — Ambulatory Visit: Payer: Medicare Other | Admitting: Cardiology

## 2019-05-19 ENCOUNTER — Encounter: Payer: Self-pay | Admitting: Cardiology

## 2019-05-24 ENCOUNTER — Other Ambulatory Visit: Payer: Self-pay | Admitting: Cardiology

## 2019-05-27 ENCOUNTER — Encounter: Payer: Self-pay | Admitting: Cardiology

## 2019-05-27 ENCOUNTER — Other Ambulatory Visit: Payer: Self-pay

## 2019-05-27 ENCOUNTER — Ambulatory Visit (INDEPENDENT_AMBULATORY_CARE_PROVIDER_SITE_OTHER): Payer: Medicare Other | Admitting: Cardiology

## 2019-05-27 VITALS — BP 142/44 | HR 57 | Ht 67.0 in | Wt 169.6 lb

## 2019-05-27 DIAGNOSIS — I10 Essential (primary) hypertension: Secondary | ICD-10-CM

## 2019-05-27 DIAGNOSIS — I4821 Permanent atrial fibrillation: Secondary | ICD-10-CM | POA: Diagnosis not present

## 2019-05-27 DIAGNOSIS — Z8673 Personal history of transient ischemic attack (TIA), and cerebral infarction without residual deficits: Secondary | ICD-10-CM | POA: Diagnosis not present

## 2019-05-27 DIAGNOSIS — R600 Localized edema: Secondary | ICD-10-CM

## 2019-05-27 DIAGNOSIS — R634 Abnormal weight loss: Secondary | ICD-10-CM

## 2019-05-27 NOTE — Progress Notes (Signed)
Cardiology Office Note:    Date:  05/27/2019   ID:  Erika [name], DOB [birth year], MRN [id number]  PCP:  Tamsen Roers, MD  Cardiologist:  Buford Dresser, MD PhD  Referring MD: Tamsen Roers, MD   CC: follow up  History of Present Illness:    Erika [name] is a 86 y.o. female with a hx of hypertension, permanent atrial fibrillation on anticoagulation with apixaban, hypothyroidism, obesity, hyperlipidemia, TIA who is seen in follow up. She is previously a Dr. Wynonia Lawman patient..  Per Dr. Thurman Coyer note, had history of esophageal perforation with TEE in 2011. Had cardioversion in 2009 and 2011, cardiolite 2011, echocardiogram 2011 with LVEF 50%.   Today: Here with daughter. Overall doing ok. Has lost >30 lbs without trying over the last year. No appetite.   BP remains [id number] systolic. No log today. Diastolic remains around 50. Tolerating 50 mg of hydralazine twice a day. No dizziness/lightheadedness/syncope.  We discussed that I am concerned about the weight loss, recommended discussing with her PCP. Also reviewed that based on her low diastolic numbers, do not want to be overaggressive with blood pressure management. They agree.  Denies chest pain, shortness of breath at rest or with normal exertion. No PND, orthopnea, change in LE edema or unexpected weight gain. No syncope or palpitations.  Past Medical History:  Diagnosis Date  . Abdominal pain   . Anemia   . ANEMIA 06/15/2009   Qualifier: Diagnosis of  By: Selena Batten CMA, Jewel    . Aortic stenosis    moderate AS by 06/2011 echo  . Arthritis   . Atrial fibrillation ([place]) 06/15/2009   Qualifier: Diagnosis of  By: Selena Batten CMA, Jewel    . Cardiac arrhythmia   . Gallstones, probable biliary colic [id number]  . GERD (gastroesophageal reflux disease)   . H/O hiatal hernia   . Hyperlipidemia   . HYPERLIPIDEMIA 06/15/2009   Qualifier: Diagnosis of  By: Selena Batten CMA, Jewel    . Hypertension   . HYPERTENSION 06/15/2009   Qualifier: Diagnosis  of  By: Selena Batten CMA, Jewel    . Hyperthyroidism   . Memory disorder 04/04/2016  . Obesity   . OBESITY 06/15/2009   Qualifier: Diagnosis of  By: Selena Batten CMA, Jewel    . Seasonal allergies   . SLEEP APNEA 06/16/2009   Qualifier: Diagnosis of  By: Rayann Heman, MD, Jeneen Rinks    . Unspecified hypothyroidism 06/15/2009   Qualifier: Diagnosis of  By: Selena Batten CMA, Jewel      Past Surgical History:  Procedure Laterality Date  . BREAST SURGERY  2012 (June or July)   right  . CHOLECYSTECTOMY  06/28/2011   Procedure: LAPAROSCOPIC CHOLECYSTECTOMY WITH INTRAOPERATIVE CHOLANGIOGRAM;  Surgeon: Merrie Roof, MD;  Location: [place];  Service: General;  Laterality: N/A;  . pharyngeal repair  [id number]   Dr Constance Holster for post-TEE pharygeal tear w neck abscess I&D  . THYROIDECTOMY  1961   80% removed    Current Medications: Current Outpatient Medications on File Prior to Visit  Medication Sig  . amLODipine (NORVASC) 10 MG tablet Take 1 tablet (10 mg total) by mouth daily.  Marland Kitchen ELIQUIS 5 MG TABS tablet TAKE 1 TABLET BY MOUTH TWICE A DAY  . ergocalciferol (VITAMIN D2) 50000 UNITS capsule Take 50,000 Units by mouth once a week. Take on thursday  . escitalopram (LEXAPRO) 10 MG tablet TAKE 1 TABLET BY MOUTH DAILY TO HELP DEPRESSION  . furosemide (LASIX) 20 MG tablet Take 10 mg by mouth.   . hydrALAZINE (  APRESOLINE) 50 MG tablet Take 1 tablet (50 mg total) by mouth in the morning and at bedtime.  Marland Kitchen levothyroxine (SYNTHROID, LEVOTHROID) 175 MCG tablet Take 175 mcg by mouth every morning.   Marland Kitchen lisinopril (ZESTRIL) 40 MG tablet TAKE 1 TABLET BY MOUTH EVERY DAY  . loratadine (CLARITIN) 10 MG tablet Take 10 mg by mouth daily as needed for allergies.  Marland Kitchen omega-3 acid ethyl esters (LOVAZA) 1 G capsule Take 1 g by mouth daily.   Marland Kitchen omeprazole (PRILOSEC) 20 MG capsule Take 20 mg by mouth daily.  . Red Yeast Rice Extract (RED YEAST RICE PO) Take 1 tablet by mouth daily.   No current facility-administered medications on file prior to visit.      Allergies:   Celecoxib, Amiodarone, Dronedarone hydrochloride, Ezetimibe, Flecainide acetate, Namenda [memantine hcl], and Sulfonamide derivatives   Social History   Tobacco Use  . Smoking status: Never Smoker  . Smokeless tobacco: Never Used  Substance Use Topics  . Alcohol use: No  . Drug use: No    Family History: The patient's family history includes Cancer in her maternal aunt; Heart disease in her father and mother. Brother with CAD, another brother with CAD and hyperlipidemia, another brother with CAD. Both father and mother deceased, both had CAD. Sister has cancer and CAD.  ROS:   Please see the history of present illness.  Additional pertinent ROS negative except as documented.  EKGs/Labs/Other Studies Reviewed:    The following studies were reviewed today: Notes/studies from Dr. Wynonia Lawman previously  EKG:  EKG is personally reviewed.  The ekg ordered 03/12/19 demonstrates atrial fibrillation with slow ventricular response, heart rate 55 bpm  Recent Labs: 02/12/2019: BUN 14; Creatinine, Ser 1.03; Potassium 5.2; Sodium 132  Recent Lipid Panel    Component Value Date/Time   CHOL 303 (H) 06/23/2011 0635   TRIG 61 06/23/2011 0635   HDL 52 06/23/2011 0635   CHOLHDL 5.8 06/23/2011 0635   VLDL 12 06/23/2011 0635   LDLCALC 239 (H) 06/23/2011 0635    Physical Exam:    VS:  BP (!) 142/44   Pulse (!) 57   Ht 5\' 7"  (1.702 m)   Wt 169 lb 9.6 oz (76.9 kg)   SpO2 97%   BMI 26.56 kg/m     Wt Readings from Last 3 Encounters:  05/27/19 169 lb 9.6 oz (76.9 kg)  04/08/19 189 lb (85.7 kg)  03/12/19 179 lb (81.2 kg)    GEN: Well nourished, well developed in no acute distress HEENT: Normal, moist mucous membranes NECK: No JVD CARDIAC: irregularly irregular rhythm, normal S1 and S2, no rubs or gallops. 2/6 systolic murmur, early peaking. VASCULAR: Radial and DP pulses 2+ bilaterally. No carotid bruits RESPIRATORY:  Clear to auscultation without rales, wheezing or rhonchi   ABDOMEN: Soft, non-tender, non-distended MUSCULOSKELETAL:  Ambulates independently SKIN: Warm and dry, trivial bilateral LE edema NEUROLOGIC:  No focal neuro deficits noted. PSYCHIATRIC:  Normal affect   ASSESSMENT:    1. Essential hypertension   2. Permanent atrial fibrillation ([place])   3. Bilateral leg edema   4. History of TIA (transient ischemic attack)   5. Unintentional weight loss    PLAN:    hypertension:historically difficult to control, but improved on current regimen. Will not be over aggressive given low diastolic pressure to keep MAPs normal and prevent symptoms -continue amlodipine 10 mg daily -had to stop carvedilol due to bradycardia. -continue lisinopril 40 mg daily -continue furosemide 10 mg daily -K was 5.2, unable  to start spironolactone -continue hydralazine 50 mg BID -ok to use 10 mg PRN hydralazine for elevated systolic blood pressures -continue taking daily blood pressures -counseled on red flag signs that need immediate medical attention -discussed that with low diastolic pressure, we wants MAP to stay >65-70.   Atrial fibrillation, permanent: history of TIA -CHA2DS2/VAS Stroke Risk Points = 6 (HTN, age, TIA, gender) -tolerating anticoagulation, on apixaban 5 mg BID.  -rate controlled on no AV nodal agents, was bradycardic with low dose carvedilol  LE edema: trace, well controlled on 10 mg lasix daily  Unintentional weight loss: recommend they discuss more with their PCP  Plan for follow up: 3 mos, then stretch to 6 mos if all is well  Medication Adjustments/Labs and Tests Ordered: Current medicines are reviewed at length with the patient today.  Concerns regarding medicines are outlined above.  No orders of the defined types were placed in this encounter.  No orders of the defined types were placed in this encounter.   Patient Instructions  Medication Instructions:  Your Physician recommend you continue on your current medication as  directed.    *If you need a refill on your cardiac medications before your next appointment, please call your pharmacy*   Lab Work: None  Testing/Procedures: None   Follow-Up: At [facility], you and your health needs are our priority.  As part of our continuing mission to provide you with exceptional heart care, we have created designated Provider Care Teams.  These Care Teams include your primary Cardiologist (physician) and Advanced Practice Providers (APPs -  Physician Assistants and Nurse Practitioners) who all work together to provide you with the care you need, when you need it.  We recommend signing up for the patient portal called "MyChart".  Sign up information is provided on this After Visit Summary.  MyChart is used to connect with patients for Virtual Visits (Telemedicine).  Patients are able to view lab/test results, encounter notes, upcoming appointments, etc.  Non-urgent messages can be sent to your provider as well.   To learn more about what you can do with MyChart, go to [email or web address].    Your next appointment:   3 month(s)  The format for your next appointment:   In Person  Provider:   Buford Dresser, MD       Signed, Buford Dresser, MD PhD 05/27/2019  [place]

## 2019-05-27 NOTE — Patient Instructions (Signed)

## 2019-06-03 ENCOUNTER — Encounter: Payer: Self-pay | Admitting: Cardiology

## 2019-07-09 ENCOUNTER — Other Ambulatory Visit: Payer: Self-pay | Admitting: Cardiology

## 2019-07-27 ENCOUNTER — Other Ambulatory Visit: Payer: Self-pay | Admitting: Cardiology

## 2019-08-13 ENCOUNTER — Encounter: Payer: Self-pay | Admitting: Adult Health

## 2019-08-13 ENCOUNTER — Ambulatory Visit (INDEPENDENT_AMBULATORY_CARE_PROVIDER_SITE_OTHER): Payer: Medicare Other | Admitting: Adult Health

## 2019-08-13 VITALS — BP 147/55 | HR 55 | Ht 67.0 in | Wt 165.0 lb

## 2019-08-13 DIAGNOSIS — R413 Other amnesia: Secondary | ICD-10-CM | POA: Diagnosis not present

## 2019-08-13 NOTE — Patient Instructions (Signed)
Memory score stable Continue to monitor symptoms If your symptoms worsen or you develop new symptoms please let us know.

## 2019-08-13 NOTE — Progress Notes (Signed)
PATIENT: Erika [name] DOB: 11-Sep-1932  REASON FOR VISIT: follow up HISTORY FROM: patient  HISTORY OF PRESENT ILLNESS: Today 08/13/19:  Erika [name] is an 86 year old female with a history of memory disturbance.  She returns today for follow-up.  She continues to live at home with her husband.  She is able to complete all ADLs independently.  She no longer cooks.  Her daughter helps them with her finances.  She is currently not on any medication for her memory.  Was unable to tolerate Aricept and Namenda in the past.  Daughter reports that she has lost weight.  Reports that she has very little appetite.  She does get her to drink 2 ensures a day.   she returns today for an evaluation.  HISTORY 01/29/19:  Erika [name] is an 86 year old female with a history of memory disturbance.  She returns today for follow-up.  She is here with her daughter.  She continues to live at home with her husband.  She is able to complete all ADLs independently.  She no longer operates a motor vehicle.  Her daughter continues to help with her finances.  She does not prepare any meals.  They either pick up food or she has family that will drop something off.  She has tried Aricept and Namenda in the past but was unable to tolerate the medication.  She does not wish to start any new medication.  REVIEW OF SYSTEMS: Out of a complete 14 system review of symptoms, the patient complains only of the following symptoms, and all other reviewed systems are negative.  ALLERGIES: Allergies  Allergen Reactions  . Celecoxib Hives and Itching    Face only  . Amiodarone     Ineffective   . Dronedarone Hydrochloride     Edema   . Ezetimibe     Fatigue   . Flecainide Acetate     fatigue  . Namenda [Memantine Hcl] Other (See Comments)    Slept alot, felt terrible  . Sulfonamide Derivatives Rash    "Mouth breaks out"    HOME MEDICATIONS: Outpatient Medications Prior to Visit  Medication Sig Dispense Refill  .  amLODipine (NORVASC) 10 MG tablet Take 1 tablet (10 mg total) by mouth daily. 90 tablet 3  . ELIQUIS 5 MG TABS tablet TAKE 1 TABLET BY MOUTH TWICE A DAY 180 tablet 1  . ergocalciferol (VITAMIN D2) 50000 UNITS capsule Take 50,000 Units by mouth once a week. Take on thursday    . escitalopram (LEXAPRO) 10 MG tablet TAKE 1 TABLET BY MOUTH DAILY TO HELP DEPRESSION  3  . furosemide (LASIX) 20 MG tablet Take 10 mg by mouth.     . hydrALAZINE (APRESOLINE) 50 MG tablet Take 1 tablet (50 mg total) by mouth in the morning and at bedtime. 180 tablet 3  . levothyroxine (SYNTHROID, LEVOTHROID) 175 MCG tablet Take 200 mcg by mouth every morning.     Marland Kitchen lisinopril (ZESTRIL) 40 MG tablet TAKE 1 TABLET BY MOUTH EVERY DAY 90 tablet 2  . loratadine (CLARITIN) 10 MG tablet Take 10 mg by mouth daily as needed for allergies.    Marland Kitchen omega-3 acid ethyl esters (LOVAZA) 1 G capsule Take 1 g by mouth daily.     Marland Kitchen omeprazole (PRILOSEC) 20 MG capsule Take 20 mg by mouth daily.    . Red Yeast Rice Extract (RED YEAST RICE PO) Take 1 tablet by mouth daily.    Marland Kitchen amLODipine (NORVASC) 5 MG tablet TAKE 1 TABLET  BY MOUTH EVERY DAY 90 tablet 3   No facility-administered medications prior to visit.    PAST MEDICAL HISTORY: Past Medical History:  Diagnosis Date  . Abdominal pain   . Anemia   . ANEMIA 06/15/2009   Qualifier: Diagnosis of  By: Selena Batten CMA, Jewel    . Aortic stenosis    moderate AS by 06/2011 echo  . Arthritis   . Atrial fibrillation ([place]) 06/15/2009   Qualifier: Diagnosis of  By: Selena Batten CMA, Jewel    . Cardiac arrhythmia   . Gallstones, probable biliary colic 0/02/5850  . GERD (gastroesophageal reflux disease)   . H/O hiatal hernia   . Hyperlipidemia   . HYPERLIPIDEMIA 06/15/2009   Qualifier: Diagnosis of  By: Selena Batten CMA, Jewel    . Hypertension   . HYPERTENSION 06/15/2009   Qualifier: Diagnosis of  By: Selena Batten CMA, Jewel    . Hyperthyroidism   . Memory disorder 04/04/2016  . Obesity   . OBESITY 06/15/2009   Qualifier:  Diagnosis of  By: Selena Batten CMA, Jewel    . Seasonal allergies   . SLEEP APNEA 06/16/2009   Qualifier: Diagnosis of  By: Rayann Heman, MD, Jeneen Rinks    . Unspecified hypothyroidism 06/15/2009   Qualifier: Diagnosis of  By: Selena Batten CMA, Jewel      PAST SURGICAL HISTORY: Past Surgical History:  Procedure Laterality Date  . BREAST SURGERY  2012 (June or July)   right  . CHOLECYSTECTOMY  06/28/2011   Procedure: LAPAROSCOPIC CHOLECYSTECTOMY WITH INTRAOPERATIVE CHOLANGIOGRAM;  Surgeon: Merrie Roof, MD;  Location: [place];  Service: General;  Laterality: N/A;  . pharyngeal repair  [id number]   Dr Constance Holster for post-TEE pharygeal tear w neck abscess I&D  . THYROIDECTOMY  1961   80% removed    FAMILY HISTORY: Family History  Problem Relation Age of Onset  . Heart disease Mother   . Heart disease Father   . Cancer Maternal Aunt        breast    SOCIAL HISTORY: Social History   Socioeconomic History  . Marital status: Married    Spouse name: Jeneen Rinks  . Number of children: 2  . Years of education: 75  . Highest education level: Not on file  Occupational History  . Occupation: Retired  Tobacco Use  . Smoking status: Never Smoker  . Smokeless tobacco: Never Used  Substance and Sexual Activity  . Alcohol use: No  . Drug use: No  . Sexual activity: Yes    Birth control/protection: Post-menopausal  Other Topics Concern  . Not on file  Social History Narrative   Lives with husband in [place], [place].   Independent in activities of daily living. Still drives.   Caffeine use: Coffee and Mt Dew daily   Right-handed   Social Determinants of Health   Financial Resource Strain:   . Difficulty of Paying Living Expenses:   Food Insecurity:   . Worried About Charity fundraiser in the Last Year:   . Arboriculturist in the Last Year:   Transportation Needs:   . Film/video editor (Medical):   Marland Kitchen Lack of Transportation (Non-Medical):   Physical Activity:   . Days of Exercise per Week:   . Minutes of  Exercise per Session:   Stress:   . Feeling of Stress :   Social Connections:   . Frequency of Communication with Friends and Family:   . Frequency of Social Gatherings with Friends and Family:   . Attends Religious Services:   .  Active Member of Clubs or Organizations:   . Attends Archivist Meetings:   Marland Kitchen Marital Status:   Intimate Partner Violence:   . Fear of Current or Ex-Partner:   . Emotionally Abused:   Marland Kitchen Physically Abused:   . Sexually Abused:       PHYSICAL EXAM  Vitals:   08/13/19 1429  BP: (!) 147/55  Pulse: (!) 55  Weight: 169 lb (76.7 kg)  Height: 5\' 7"  (1.702 m)   Body mass index is 26.47 kg/m.   MMSE - Mini Mental State Exam 08/13/2019 01/29/2019 07/17/2018  Orientation to time 5 3 5   Orientation to Place 5 4 4   Registration 3 3 3   Attention/ Calculation 2 2 5   Recall 0 1 0  Language- name 2 objects 2 2 2   Language- repeat 0 1 1  Language- follow 3 step command 3 3 3   Language- read & follow direction 1 1 1   Write a sentence 1 1 1   Copy design 1 0 1  Copy design-comments 8 animals named 6 animals 8 animals  Total score 23 21 26      Generalized: Well developed, in no acute distress   Neurological examination  Mentation: Alert oriented to time, place, history taking. Follows all commands speech and language fluent Cranial nerve II-XII: Pupils were equal round reactive to light. Extraocular movements were full, visual field were full on confrontational test.e. Head turning and shoulder shrug  were normal and symmetric. Motor: The motor testing reveals 5 over 5 strength of all 4 extremities. Good symmetric motor tone is noted throughout.  Sensory: Sensory testing is intact to soft touch on all 4 extremities. No evidence of extinction is noted.  Coordination: Cerebellar testing reveals good finger-nose-finger and heel-to-shin bilaterally.  Gait and station: in a wheelchair today. Needs assist with standing Reflexes: Deep tendon reflexes are  symmetric and normal bilaterally.   DIAGNOSTIC DATA (LABS, IMAGING, TESTING) - I reviewed patient records, labs, notes, testing and imaging myself where available.  Lab Results  Component Value Date   WBC 9.8 01/01/2012   HGB 11.8 (L) 01/01/2012   HCT 34.8 (L) 01/01/2012   MCV 85.1 01/01/2012   PLT 225 01/01/2012      Component Value Date/Time   NA 132 (L) 02/12/2019 1526   K 5.2 02/12/2019 1526   CL 93 (L) 02/12/2019 1526   CO2 24 02/12/2019 1526   GLUCOSE 94 02/12/2019 1526   GLUCOSE 103 (H) 01/01/2012 2140   BUN 14 02/12/2019 1526   CREATININE 1.03 (H) 02/12/2019 1526   CALCIUM 9.8 02/12/2019 1526   PROT 7.0 01/01/2012 2140   ALBUMIN 3.7 01/01/2012 2140   AST 18 01/01/2012 2140   ALT 13 01/01/2012 2140   ALKPHOS 83 01/01/2012 2140   BILITOT 0.2 (L) 01/01/2012 2140   GFRNONAA 50 (L) 02/12/2019 1526   GFRAA 58 (L) 02/12/2019 1526   Lab Results  Component Value Date   CHOL 303 (H) 06/23/2011   HDL 52 06/23/2011   LDLCALC 239 (H) 06/23/2011   TRIG 61 06/23/2011   CHOLHDL 5.8 06/23/2011   Lab Results  Component Value Date   HGBA1C 5.6 06/23/2011   No results found for: VITAMINB12 Lab Results  Component Value Date   TSH 0.285 (L) 06/23/2011      ASSESSMENT AND PLAN 86 y.o. year old female  has a past medical history of Abdominal pain, Anemia, ANEMIA (06/15/2009), Aortic stenosis, Arthritis, Atrial fibrillation ([place]) (06/15/2009), Cardiac arrhythmia, Gallstones, probable biliary colic (  05/09/2011), GERD (gastroesophageal reflux disease), H/O hiatal hernia, Hyperlipidemia, HYPERLIPIDEMIA (06/15/2009), Hypertension, HYPERTENSION (06/15/2009), Hyperthyroidism, Memory disorder (04/04/2016), Obesity, OBESITY (06/15/2009), Seasonal allergies, SLEEP APNEA (06/16/2009), and Unspecified hypothyroidism (06/15/2009). here with:  Memory disturbance   MMSE 23/30  Stable  Advised if symptoms worsen or she develops new symptoms she should let us know  Follow-up in 1 year or sooner if  needed  I spent 25 minutes of face-to-face and non-face-to-face time with patient.  This included previsit chart review, lab review, study review, order entry, electronic health record documentation, patient education.  Ward Givens, MSN, NP-C 08/13/2019, 2:36 PM [facility] [address] [place], [place] [address] [phone]

## 2019-09-09 ENCOUNTER — Encounter: Payer: Self-pay | Admitting: Cardiology

## 2019-09-09 ENCOUNTER — Ambulatory Visit (INDEPENDENT_AMBULATORY_CARE_PROVIDER_SITE_OTHER): Payer: Medicare Other | Admitting: Cardiology

## 2019-09-09 ENCOUNTER — Other Ambulatory Visit: Payer: Self-pay

## 2019-09-09 VITALS — BP 132/32 | HR 54 | Ht 67.0 in | Wt 158.4 lb

## 2019-09-09 DIAGNOSIS — Z8673 Personal history of transient ischemic attack (TIA), and cerebral infarction without residual deficits: Secondary | ICD-10-CM | POA: Diagnosis not present

## 2019-09-09 DIAGNOSIS — R634 Abnormal weight loss: Secondary | ICD-10-CM | POA: Diagnosis not present

## 2019-09-09 DIAGNOSIS — I4821 Permanent atrial fibrillation: Secondary | ICD-10-CM

## 2019-09-09 DIAGNOSIS — I350 Nonrheumatic aortic (valve) stenosis: Secondary | ICD-10-CM

## 2019-09-09 DIAGNOSIS — R011 Cardiac murmur, unspecified: Secondary | ICD-10-CM

## 2019-09-09 DIAGNOSIS — I10 Essential (primary) hypertension: Secondary | ICD-10-CM | POA: Diagnosis not present

## 2019-09-09 NOTE — Progress Notes (Signed)
Cardiology Office Note:    Date:  09/09/2019   ID:  Erika [name], DOB 1932/02/20, MRN [id number]  PCP:  Tamsen Roers, MD  Cardiologist:  Buford Dresser, MD PhD  Referring MD: Tamsen Roers, MD   CC: follow up  History of Present Illness:    Erika [name] is a 86 y.o. female with a hx of hypertension, permanent atrial fibrillation on anticoagulation with apixaban, hypothyroidism, obesity, hyperlipidemia, TIA who is seen in follow up. She is previously a Dr. Wynonia Lawman patient..  Per Dr. Thurman Coyer note, had history of esophageal perforation with TEE in 2011. Had cardioversion in 2009 and 2011, cardiolite 2011, echocardiogram 2011 with LVEF 50%.   Today: Here with her daughter today, who is caring for both Erika [name] and her husband.  Feels unchanged. Minimal activity, in wheelchair today and largely sedentary at home. Still with no appetite, recommended to try ensure by PCP. Daughter thinks she is down 50 lbs (was 30 at the last visit). Diastolic numbers remain low, asymptomatic.  BP yesterday 157/64, before medications are due. Having issues with diarrhea, shortly after breakfast. Sometimes cannot make it to the bathroom. Denies abdominal pain after eating.  No pitting edema. Bled somewhat after a cut on her leg, but no other significant bleeding.  Denies chest pain, shortness of breath at rest or with normal exertion. No PND, orthopnea, LE edema or unexpected weight gain. No syncope or palpitations.  Past Medical History:  Diagnosis Date  . Abdominal pain   . Anemia   . ANEMIA 06/15/2009   Qualifier: Diagnosis of  By: Selena Batten CMA, Jewel    . Aortic stenosis    moderate AS by 06/2011 echo  . Arthritis   . Atrial fibrillation ([place]) 06/15/2009   Qualifier: Diagnosis of  By: Selena Batten CMA, Jewel    . Cardiac arrhythmia   . Gallstones, probable biliary colic 03/10/6710  . GERD (gastroesophageal reflux disease)   . H/O hiatal hernia   . Hyperlipidemia   . HYPERLIPIDEMIA 06/15/2009   Qualifier:  Diagnosis of  By: Selena Batten CMA, Jewel    . Hypertension   . HYPERTENSION 06/15/2009   Qualifier: Diagnosis of  By: Selena Batten CMA, Jewel    . Hyperthyroidism   . Memory disorder 04/04/2016  . Obesity   . OBESITY 06/15/2009   Qualifier: Diagnosis of  By: Selena Batten CMA, Jewel    . Seasonal allergies   . SLEEP APNEA 06/16/2009   Qualifier: Diagnosis of  By: Rayann Heman, MD, Jeneen Rinks    . Unspecified hypothyroidism 06/15/2009   Qualifier: Diagnosis of  By: Selena Batten CMA, Jewel      Past Surgical History:  Procedure Laterality Date  . BREAST SURGERY  2012 (June or July)   right  . CHOLECYSTECTOMY  06/28/2011   Procedure: LAPAROSCOPIC CHOLECYSTECTOMY WITH INTRAOPERATIVE CHOLANGIOGRAM;  Surgeon: Merrie Roof, MD;  Location: [facility];  Service: General;  Laterality: N/A;  . pharyngeal repair  [id number]   Dr Constance Holster for post-TEE pharygeal tear w neck abscess I&D  . THYROIDECTOMY  1961   80% removed    Current Medications: Current Outpatient Medications on File Prior to Visit  Medication Sig  . amLODipine (NORVASC) 10 MG tablet Take 1 tablet (10 mg total) by mouth daily.  Marland Kitchen ELIQUIS 5 MG TABS tablet TAKE 1 TABLET BY MOUTH TWICE A DAY  . ergocalciferol (VITAMIN D2) 50000 UNITS capsule Take 50,000 Units by mouth once a week. Take on thursday  . escitalopram (LEXAPRO) 10 MG tablet TAKE 1 TABLET BY MOUTH DAILY  TO HELP DEPRESSION  . furosemide (LASIX) 20 MG tablet Take 10 mg by mouth.   . hydrALAZINE (APRESOLINE) 50 MG tablet Take 1 tablet (50 mg total) by mouth in the morning and at bedtime.  Marland Kitchen levothyroxine (SYNTHROID, LEVOTHROID) 175 MCG tablet Take 200 mcg by mouth every morning.   Marland Kitchen lisinopril (ZESTRIL) 40 MG tablet TAKE 1 TABLET BY MOUTH EVERY DAY  . loratadine (CLARITIN) 10 MG tablet Take 10 mg by mouth daily as needed for allergies.  Marland Kitchen omega-3 acid ethyl esters (LOVAZA) 1 G capsule Take 1 g by mouth daily.   Marland Kitchen omeprazole (PRILOSEC) 20 MG capsule Take 20 mg by mouth daily.  . Red Yeast Rice Extract (RED YEAST RICE PO)  Take 2 tablets by mouth daily.    No current facility-administered medications on file prior to visit.     Allergies:   Celecoxib, Amiodarone, Dronedarone hydrochloride, Ezetimibe, Flecainide acetate, Namenda [memantine hcl], and Sulfonamide derivatives   Social History   Tobacco Use  . Smoking status: Never Smoker  . Smokeless tobacco: Never Used  Substance Use Topics  . Alcohol use: No  . Drug use: No    Family History: The patient's family history includes Cancer in her maternal aunt; Heart disease in her father and mother. Brother with CAD, another brother with CAD and hyperlipidemia, another brother with CAD. Both father and mother deceased, both had CAD. Sister has cancer and CAD.  ROS:   Please see the history of present illness.  Additional pertinent ROS negative except as documented.  EKGs/Labs/Other Studies Reviewed:    The following studies were reviewed today: Notes/studies from Dr. Wynonia Lawman previously  EKG:  EKG is personally reviewed.  The ekg ordered 03/12/19 demonstrates atrial fibrillation with slow ventricular response, heart rate 55 bpm  Recent Labs: 02/12/2019: BUN 14; Creatinine, Ser 1.03; Potassium 5.2; Sodium 132  Recent Lipid Panel    Component Value Date/Time   CHOL 303 (H) 06/23/2011 0635   TRIG 61 06/23/2011 0635   HDL 52 06/23/2011 0635   CHOLHDL 5.8 06/23/2011 0635   VLDL 12 06/23/2011 0635   LDLCALC 239 (H) 06/23/2011 0635    Physical Exam:    VS:  BP (!) 132/32   Pulse (!) 54   Ht 5\' 7"  (1.702 m)   Wt 158 lb 6.4 oz (71.8 kg)   SpO2 98%   BMI 24.81 kg/m     Wt Readings from Last 3 Encounters:  09/09/19 158 lb 6.4 oz (71.8 kg)  08/13/19 165 lb (74.8 kg)  05/27/19 169 lb 9.6 oz (76.9 kg)    GEN: Well nourished, well developed in no acute distress HEENT: Normal, moist mucous membranes NECK: No JVD CARDIAC: regular rhythm, normal S1 and S2, no rubs or gallops. 2/6 early systolic murmur. VASCULAR: Radial and DP pulses 2+ bilaterally. No  carotid bruits RESPIRATORY:  Clear to auscultation without rales, wheezing or rhonchi  ABDOMEN: Soft, non-tender, non-distended MUSCULOSKELETAL:  Ambulates independently SKIN: Warm and dry, bilateral trace nonpitting edema NEUROLOGIC:  Alert and oriented x 3. No focal neuro deficits noted. PSYCHIATRIC:  Normal affect   ASSESSMENT:    1. Permanent atrial fibrillation ([place])   2. Essential hypertension   3. History of TIA (transient ischemic attack)   4. Unintentional weight loss   5. Aortic valve stenosis, etiology of cardiac valve disease unspecified   6. Murmur, cardiac    PLAN:    hypertension: -has been difficult to manage, balance between elevated systolic pressures and low diastolic numbers -  continue amlodipine 10 mg daily. Trivial nonpitting bilateral LE edema -had to stop carvedilol due to bradycardia. -continue lisinopril 40 mg daily -continue furosemide 10 mg daily -K was 5.2, unable to start spironolactone -continue hydralazine 50 mg BID. Gave instructions today on how to manage if BP low. -ok to use 10 mg PRN hydralazine for elevated systolic blood pressures -continue taking daily blood pressures -counseled on red flag signs that need immediate medical attention  Atrial fibrillation, permanent: history of TIA -CHA2DS2/VAS Stroke Risk Points = 6 (HTN, age, TIA, gender) -tolerating anticoagulation, on apixaban 5 mg BID.  -rate controlled on no AV nodal agents, was bradycardic with low dose carvedilol  Murmur: consistent with aortic stenosis murmur. Early peaking.  -last echo 2013 with moderate stenosis -asymptomatic -repeat echo for worsening symptoms -would be high risk TEE for TAVR given prior perforation  LE edema: trace, well controlled on 10 mg lasix daily  Unintentional weight loss: working on improving nutrition with ensure. Down about 50 lbs unintentionally. Has discussed with PCP.  Plan for follow up: 6 mos or sooner as needed.  Medication  Adjustments/Labs and Tests Ordered: Current medicines are reviewed at length with the patient today.  Concerns regarding medicines are outlined above.  No orders of the defined types were placed in this encounter.  No orders of the defined types were placed in this encounter.   Patient Instructions  Medication Instructions:  Your Physician recommend you continue on your current medication as directed.    If blood pressure numbers are less than 110 on the top number or 40 on the bottom number, would not given upcoming hydralazine. If top number is more than 160 but bottom pressure is less than 40, would take only 10 mg hydralazine.  *If you need a refill on your cardiac medications before your next appointment, please call your pharmacy*   Lab Work: None   Testing/Procedures: None   Follow-Up: At [facility], you and your health needs are our priority.  As part of our continuing mission to provide you with exceptional heart care, we have created designated Provider Care Teams.  These Care Teams include your primary Cardiologist (physician) and Advanced Practice Providers (APPs -  Physician Assistants and Nurse Practitioners) who all work together to provide you with the care you need, when you need it.  We recommend signing up for the patient portal called "MyChart".  Sign up information is provided on this After Visit Summary.  MyChart is used to connect with patients for Virtual Visits (Telemedicine).  Patients are able to view lab/test results, encounter notes, upcoming appointments, etc.  Non-urgent messages can be sent to your provider as well.   To learn more about what you can do with MyChart, go to [email or web address].    Your next appointment:   6 month(s)  The format for your next appointment:   In Person  Provider:   Buford Dresser, MD       Signed, Buford Dresser, MD PhD 09/09/2019  [place]

## 2019-09-09 NOTE — Patient Instructions (Addendum)
Medication Instructions:  Your Physician recommend you continue on your current medication as directed.    If blood pressure numbers are less than 110 on the top number or 40 on the bottom number, would not given upcoming hydralazine. If top number is more than 160 but bottom pressure is less than 40, would take only 10 mg hydralazine.  *If you need a refill on your cardiac medications before your next appointment, please call your pharmacy*   Lab Work: None   Testing/Procedures: None   Follow-Up: At [facility], you and your health needs are our priority.  As part of our continuing mission to provide you with exceptional heart care, we have created designated Provider Care Teams.  These Care Teams include your primary Cardiologist (physician) and Advanced Practice Providers (APPs -  Physician Assistants and Nurse Practitioners) who all work together to provide you with the care you need, when you need it.  We recommend signing up for the patient portal called "MyChart".  Sign up information is provided on this After Visit Summary.  MyChart is used to connect with patients for Virtual Visits (Telemedicine).  Patients are able to view lab/test results, encounter notes, upcoming appointments, etc.  Non-urgent messages can be sent to your provider as well.   To learn more about what you can do with MyChart, go to [email or web address].    Your next appointment:   6 month(s)  The format for your next appointment:   In Person  Provider:   Buford Dresser, MD

## 2019-10-02 ENCOUNTER — Emergency Department (HOSPITAL_COMMUNITY)
Admission: EM | Admit: 2019-10-02 | Discharge: 2019-10-03 | Disposition: A | Discharge status: Home / Self Care | Payer: Medicare Other | Attending: Emergency Medicine | Admitting: Emergency Medicine

## 2019-10-02 ENCOUNTER — Other Ambulatory Visit: Payer: Self-pay

## 2019-10-02 ENCOUNTER — Emergency Department (HOSPITAL_COMMUNITY): Payer: Medicare Other

## 2019-10-02 DIAGNOSIS — Y9389 Activity, other specified: Secondary | ICD-10-CM | POA: Insufficient documentation

## 2019-10-02 DIAGNOSIS — Z79899 Other long term (current) drug therapy: Secondary | ICD-10-CM | POA: Insufficient documentation

## 2019-10-02 DIAGNOSIS — I10 Essential (primary) hypertension: Secondary | ICD-10-CM | POA: Diagnosis not present

## 2019-10-02 DIAGNOSIS — Y92002 Bathroom of unspecified non-institutional (private) residence single-family (private) house as the place of occurrence of the external cause: Secondary | ICD-10-CM | POA: Diagnosis not present

## 2019-10-02 DIAGNOSIS — M25551 Pain in right hip: Secondary | ICD-10-CM | POA: Diagnosis not present

## 2019-10-02 DIAGNOSIS — Z7901 Long term (current) use of anticoagulants: Secondary | ICD-10-CM | POA: Insufficient documentation

## 2019-10-02 DIAGNOSIS — W010XXA Fall on same level from slipping, tripping and stumbling without subsequent striking against object, initial encounter: Secondary | ICD-10-CM | POA: Diagnosis not present

## 2019-10-02 DIAGNOSIS — M25559 Pain in unspecified hip: Secondary | ICD-10-CM

## 2019-10-02 DIAGNOSIS — W19XXXA Unspecified fall, initial encounter: Secondary | ICD-10-CM

## 2019-10-02 NOTE — ED Triage Notes (Addendum)
To ED for eval after slipping in bathroom, falling in tub, and now with left hip pain. Pt was able to ambulate after fall but pain increased with time. No head injury. Pt alert and oriented. Denies feeling dizzy or sob prior to fall.  HOH. Sitting in wheelchair. With family at side.

## 2019-10-03 DIAGNOSIS — M25551 Pain in right hip: Secondary | ICD-10-CM | POA: Diagnosis not present

## 2019-10-03 MED ORDER — HYDROCODONE-ACETAMINOPHEN 5-325 MG PO TABS: 1.0000 | ORAL_TABLET | ORAL | 0 refills | Status: DC | PRN

## 2019-10-03 MED ORDER — OXYCODONE-ACETAMINOPHEN 5-325 MG PO TABS
1.0000 | ORAL_TABLET | ORAL | Status: DC | PRN
  Administered 2019-10-03: 1 via ORAL
  Filled 2019-10-03: qty 1

## 2019-10-03 NOTE — ED Provider Notes (Signed)
[facility] EMERGENCY DEPARTMENT Provider Note   CSN: [id number] Arrival date & time: 10/02/19  2106     History Chief Complaint  Patient presents with  . Fall  . Hip Pain    Erika [name] is a 86 y.o. female.  86 year old female presents from home, was in the bathroom last night getting ready for bed when she fell and hit her right pelvis on the tub. Denies feeling weak or dizzy prior to the fall, did not hit head or loss of consciousness; is anticoagulated on Eliquis for a fib. Reported worsening right hip pain at home which prompted ER visit. Patient is ambulatory with a walker and assistance at home. No other complaints or concerns.         Past Medical History:  Diagnosis Date  . Abdominal pain   . Anemia   . ANEMIA 06/15/2009   Qualifier: Diagnosis of  By: Selena Batten CMA, Jewel    . Aortic stenosis    moderate AS by 06/2011 echo  . Arthritis   . Atrial fibrillation ([place]) 06/15/2009   Qualifier: Diagnosis of  By: Selena Batten CMA, Jewel    . Cardiac arrhythmia   . Gallstones, probable biliary colic 03/09/9516  . GERD (gastroesophageal reflux disease)   . H/O hiatal hernia   . Hyperlipidemia   . HYPERLIPIDEMIA 06/15/2009   Qualifier: Diagnosis of  By: Selena Batten CMA, Jewel    . Hypertension   . HYPERTENSION 06/15/2009   Qualifier: Diagnosis of  By: Selena Batten CMA, Jewel    . Hyperthyroidism   . Memory disorder 04/04/2016  . Obesity   . OBESITY 06/15/2009   Qualifier: Diagnosis of  By: Selena Batten CMA, Jewel    . Seasonal allergies   . SLEEP APNEA 06/16/2009   Qualifier: Diagnosis of  By: Rayann Heman, MD, Jeneen Rinks    . Unspecified hypothyroidism 06/15/2009   Qualifier: Diagnosis of  By: Selena Batten CMA, Jewel      Patient Active Problem List   Diagnosis Date Noted  . History of TIA (transient ischemic attack) 01/29/2019  . Bilateral leg edema 01/29/2019  . Memory disorder 04/04/2016  . Gallstones, probable biliary colic 84/16/6063  . GERD (gastroesophageal reflux disease) 05/09/2011  . SLEEP  APNEA 06/16/2009  . UNSPECIFIED HYPOTHYROIDISM 06/15/2009  . Uncontrolled hypertension 06/15/2009  . OBESITY 06/15/2009  . ANEMIA 06/15/2009  . Essential hypertension 06/15/2009  . ATRIAL FIBRILLATION 06/15/2009    Past Surgical History:  Procedure Laterality Date  . BREAST SURGERY  2012 (June or July)   right  . CHOLECYSTECTOMY  06/28/2011   Procedure: LAPAROSCOPIC CHOLECYSTECTOMY WITH INTRAOPERATIVE CHOLANGIOGRAM;  Surgeon: Merrie Roof, MD;  Location: [facility];  Service: General;  Laterality: N/A;  . pharyngeal repair  [id number]   Dr Constance Holster for post-TEE pharygeal tear w neck abscess I&D  . THYROIDECTOMY  1961   80% removed     OB History   No obstetric history on file.     Family History  Problem Relation Age of Onset  . Heart disease Mother   . Heart disease Father   . Cancer Maternal Aunt        breast    Social History   Tobacco Use  . Smoking status: Never Smoker  . Smokeless tobacco: Never Used  Substance Use Topics  . Alcohol use: No  . Drug use: No    Home Medications Prior to Admission medications   Medication Sig Start Date End Date Taking? Authorizing Provider  amLODipine (NORVASC) 10  MG tablet Take 1 tablet (10 mg total) by mouth daily. 01/29/19   Buford Dresser, MD  ELIQUIS 5 MG TABS tablet TAKE 1 TABLET BY MOUTH TWICE A DAY 05/25/19   Buford Dresser, MD  ergocalciferol (VITAMIN D2) 50000 UNITS capsule Take 50,000 Units by mouth once a week. Take on thursday    [provider]  escitalopram (LEXAPRO) 10 MG tablet TAKE 1 TABLET BY MOUTH DAILY TO HELP DEPRESSION 01/23/17   [provider]  furosemide (LASIX) 20 MG tablet Take 10 mg by mouth.     [provider]  hydrALAZINE (APRESOLINE) 50 MG tablet Take 1 tablet (50 mg total) by mouth in the morning and at bedtime. 04/08/19   Buford Dresser, MD  HYDROcodone-acetaminophen (NORCO/VICODIN) 5-325 MG tablet Take 1 tablet by mouth every 4 (four) hours as needed.  10/03/19   Tacy Learn, PA-C  levothyroxine (SYNTHROID, LEVOTHROID) 175 MCG tablet Take 200 mcg by mouth every morning.     [provider]  lisinopril (ZESTRIL) 40 MG tablet TAKE 1 TABLET BY MOUTH EVERY DAY 03/11/19   Buford Dresser, MD  loratadine (CLARITIN) 10 MG tablet Take 10 mg by mouth daily as needed for allergies.    [provider]  omega-3 acid ethyl esters (LOVAZA) 1 G capsule Take 1 g by mouth daily.     [provider]  omeprazole (PRILOSEC) 20 MG capsule Take 20 mg by mouth daily.    [provider]  Red Yeast Rice Extract (RED YEAST RICE PO) Take 2 tablets by mouth daily.     [provider]    Allergies    Celecoxib, Amiodarone, Dronedarone hydrochloride, Ezetimibe, Flecainide acetate, Namenda [memantine hcl], and Sulfonamide derivatives  Review of Systems   Review of Systems  Constitutional: Negative for fever.  Gastrointestinal: Negative for abdominal pain.  Musculoskeletal: Positive for arthralgias. Negative for back pain, gait problem, neck pain and neck stiffness.  Skin: Negative for rash and wound.  Allergic/Immunologic: Negative for immunocompromised state.  Neurological: Negative for dizziness and weakness.  Hematological: Bruises/bleeds easily.  Psychiatric/Behavioral: Negative for confusion.  All other systems reviewed and are negative.   Physical Exam Updated Vital Signs BP (!) 154/81   Pulse 63   Temp 97.6 F (36.4 C) (Oral)   Resp 12   SpO2 97%   Physical Exam Vitals and nursing note reviewed.  Constitutional:      General: She is not in acute distress.    Appearance: She is well-developed. She is not diaphoretic.  HENT:     Head: Normocephalic and atraumatic.  Cardiovascular:     Pulses: Normal pulses.  Pulmonary:     Effort: Pulmonary effort is normal.  Musculoskeletal:        General: No swelling, tenderness or deformity. Normal range of motion.     Thoracic back: No tenderness or  bony tenderness.     Lumbar back: No tenderness or bony tenderness.     Right lower leg: No edema.     Left lower leg: No edema.     Comments: Indicates area of pain over right iliac crest area, non tender on exam, no pain with log roll/abduction/adduction of right hip. Patient is able to bear weight on leg without pain.  Skin:    General: Skin is warm and dry.     Findings: No bruising, erythema or rash.  Neurological:     Mental Status: She is alert and oriented to person, place, and time.  Sensory: No sensory deficit.     Motor: No weakness.  Psychiatric:        Behavior: Behavior normal.     ED Results / Procedures / Treatments   Labs (all labs ordered are listed, but only abnormal results are displayed) Labs Reviewed - No data to display  EKG None  Radiology DG HIP UNILAT WITH PELVIS 2-3 VIEWS RIGHT  Result Date: 10/02/2019 CLINICAL DATA:  Right hip pain after a fall EXAM: DG HIP (WITH OR WITHOUT PELVIS) 2-3V RIGHT COMPARISON:  None. FINDINGS: Degenerative changes in the lower lumbar spine and right hip. No evidence of acute fracture or dislocation. No focal bone lesion or bone destruction. SI joints and symphysis pubis are nondisplaced. Vascular calcifications. Soft tissues are unremarkable. IMPRESSION: Degenerative changes in the lower lumbar spine and right hip. No acute bony abnormalities. Electronically Signed   By: Lucienne Capers M.D.   On: 10/02/2019 22:43    Procedures Procedures (including critical care time)  Medications Ordered in ED Medications  oxyCODONE-acetaminophen (PERCOCET/ROXICET) 5-325 MG per tablet 1 tablet (1 tablet Oral Given 10/03/19 0040)    ED Course  I have reviewed the triage vital signs and the nursing notes.  Pertinent labs & imaging results that were available during my care of the patient were reviewed by me and considered in my medical decision making (see chart for details).  Clinical Course as of Oct 02 844  Sat Sep 25, 20252    426 86 year old female presents for evaluation after a fall.  On exam, no pain with ROM right hip, no pelvic pain or instability. No midline low back pain.  Reviewed xr results with patient and daughter. Patient is ambulatory with a walker and minimal assistance (baseline). Patient given prescription for Norco for pain not controlled with Tylenol at home.  Recommend follow-up with PCP for recheck.  Return to ER for worsening or concerning symptoms. Case discussed with Dr. Roslynn Amble, ER attending, who has seen the patient and agrees with plan of care.   [LM]    Clinical Course User Index [LM] Roque Lias   MDM Rules/Calculators/A&P                          Final Clinical Impression(s) / ED Diagnoses Final diagnoses:  Fall, initial encounter  Hip pain    Rx / DC Orders ED Discharge Orders         Ordered    HYDROcodone-acetaminophen (NORCO/VICODIN) 5-325 MG tablet  Every 4 hours PRN        10/03/19 0843           Tacy Learn, PA-C 10/03/19 6579    Lucrezia Starch, MD 10/03/19 1327

## 2019-10-03 NOTE — Discharge Instructions (Addendum)
Follow up with your doctor for recheck.  Take Norco for pain not controlled with Tylenol. Norco can cause constipation, take Colace if needed.  Return to the ER for worsening or concerning symptoms.

## 2019-10-03 NOTE — ED Notes (Signed)
Pt able to ambulate independently with walker. Pt did require one person assistance getting up off of the stretcher.

## 2019-10-03 NOTE — ED Notes (Signed)
Patient Alert and oriented to baseline. Stable and ambulatory to baseline. Patient verbalized understanding of the discharge instructions.  Patient belongings were taken by the patient.   

## 2019-10-26 ENCOUNTER — Emergency Department (HOSPITAL_BASED_OUTPATIENT_CLINIC_OR_DEPARTMENT_OTHER): Payer: Medicare Other

## 2019-10-26 ENCOUNTER — Encounter (HOSPITAL_BASED_OUTPATIENT_CLINIC_OR_DEPARTMENT_OTHER): Payer: Self-pay | Admitting: Emergency Medicine

## 2019-10-26 ENCOUNTER — Other Ambulatory Visit: Payer: Self-pay

## 2019-10-26 ENCOUNTER — Inpatient Hospital Stay (HOSPITAL_BASED_OUTPATIENT_CLINIC_OR_DEPARTMENT_OTHER)
Admission: EM | Admit: 2019-10-26 | Discharge: 2019-11-01 | DRG: 291 | Disposition: A | Discharge status: Home Health | Payer: Medicare Other | Attending: Internal Medicine | Admitting: Internal Medicine

## 2019-10-26 DIAGNOSIS — Z9049 Acquired absence of other specified parts of digestive tract: Secondary | ICD-10-CM

## 2019-10-26 DIAGNOSIS — K219 Gastro-esophageal reflux disease without esophagitis: Secondary | ICD-10-CM | POA: Diagnosis present

## 2019-10-26 DIAGNOSIS — I5033 Acute on chronic diastolic (congestive) heart failure: Secondary | ICD-10-CM | POA: Insufficient documentation

## 2019-10-26 DIAGNOSIS — J90 Pleural effusion, not elsewhere classified: Secondary | ICD-10-CM

## 2019-10-26 DIAGNOSIS — Z7901 Long term (current) use of anticoagulants: Secondary | ICD-10-CM

## 2019-10-26 DIAGNOSIS — R319 Hematuria, unspecified: Secondary | ICD-10-CM | POA: Diagnosis present

## 2019-10-26 DIAGNOSIS — N1831 Chronic kidney disease, stage 3a: Secondary | ICD-10-CM | POA: Diagnosis present

## 2019-10-26 DIAGNOSIS — Z79890 Hormone replacement therapy (postmenopausal): Secondary | ICD-10-CM

## 2019-10-26 DIAGNOSIS — R059 Cough, unspecified: Secondary | ICD-10-CM | POA: Diagnosis not present

## 2019-10-26 DIAGNOSIS — I130 Hypertensive heart and chronic kidney disease with heart failure and stage 1 through stage 4 chronic kidney disease, or unspecified chronic kidney disease: Principal | ICD-10-CM | POA: Diagnosis present

## 2019-10-26 DIAGNOSIS — E785 Hyperlipidemia, unspecified: Secondary | ICD-10-CM | POA: Diagnosis present

## 2019-10-26 DIAGNOSIS — I482 Chronic atrial fibrillation, unspecified: Secondary | ICD-10-CM | POA: Diagnosis present

## 2019-10-26 DIAGNOSIS — Z888 Allergy status to other drugs, medicaments and biological substances status: Secondary | ICD-10-CM

## 2019-10-26 DIAGNOSIS — I082 Rheumatic disorders of both aortic and tricuspid valves: Secondary | ICD-10-CM | POA: Diagnosis present

## 2019-10-26 DIAGNOSIS — Y92239 Unspecified place in hospital as the place of occurrence of the external cause: Secondary | ICD-10-CM | POA: Diagnosis not present

## 2019-10-26 DIAGNOSIS — E871 Hypo-osmolality and hyponatremia: Secondary | ICD-10-CM

## 2019-10-26 DIAGNOSIS — I10 Essential (primary) hypertension: Secondary | ICD-10-CM | POA: Diagnosis present

## 2019-10-26 DIAGNOSIS — Z882 Allergy status to sulfonamides status: Secondary | ICD-10-CM

## 2019-10-26 DIAGNOSIS — I509 Heart failure, unspecified: Secondary | ICD-10-CM

## 2019-10-26 DIAGNOSIS — E039 Hypothyroidism, unspecified: Secondary | ICD-10-CM | POA: Diagnosis present

## 2019-10-26 DIAGNOSIS — Z8673 Personal history of transient ischemic attack (TIA), and cerebral infarction without residual deficits: Secondary | ICD-10-CM

## 2019-10-26 DIAGNOSIS — Z8249 Family history of ischemic heart disease and other diseases of the circulatory system: Secondary | ICD-10-CM

## 2019-10-26 DIAGNOSIS — G4730 Sleep apnea, unspecified: Secondary | ICD-10-CM | POA: Diagnosis present

## 2019-10-26 DIAGNOSIS — Z79899 Other long term (current) drug therapy: Secondary | ICD-10-CM

## 2019-10-26 DIAGNOSIS — Z20822 Contact with and (suspected) exposure to covid-19: Secondary | ICD-10-CM | POA: Diagnosis present

## 2019-10-26 DIAGNOSIS — E876 Hypokalemia: Secondary | ICD-10-CM | POA: Diagnosis not present

## 2019-10-26 DIAGNOSIS — T502X5A Adverse effect of carbonic-anhydrase inhibitors, benzothiadiazides and other diuretics, initial encounter: Secondary | ICD-10-CM | POA: Diagnosis not present

## 2019-10-26 LAB — BASIC METABOLIC PANEL
Anion gap: 12 (ref 5–15)
BUN: 16 mg/dL (ref 8–23)
CO2: 23 mmol/L (ref 22–32)
Calcium: 9.6 mg/dL (ref 8.9–10.3)
Chloride: 89 mmol/L — ABNORMAL LOW (ref 98–111)
Creatinine, Ser: 0.97 mg/dL (ref 0.44–1.00)
GFR, Estimated: 53 mL/min — ABNORMAL LOW (ref >60)
Glucose, Bld: 115 mg/dL — ABNORMAL HIGH (ref 70–99)
Potassium: 4 mmol/L (ref 3.5–5.1)
Sodium: 124 mmol/L — ABNORMAL LOW (ref 135–145)

## 2019-10-26 LAB — CBC WITH DIFFERENTIAL/PLATELET
Abs Immature Granulocytes: 0.04 10*3/uL (ref 0.00–0.07)
Basophils Absolute: 0 10*3/uL (ref 0.0–0.1)
Basophils Relative: 0 %
Eosinophils Absolute: 0.1 10*3/uL (ref 0.0–0.5)
Eosinophils Relative: 1 %
HCT: 34.2 % — ABNORMAL LOW (ref 36.0–46.0)
Hemoglobin: 11.6 g/dL — ABNORMAL LOW (ref 12.0–15.0)
Immature Granulocytes: 0 %
Lymphocytes Relative: 16 %
Lymphs Abs: 1.7 10*3/uL (ref 0.7–4.0)
MCH: 28.4 pg (ref 26.0–34.0)
MCHC: 33.9 g/dL (ref 30.0–36.0)
MCV: 83.6 fL (ref 80.0–100.0)
Monocytes Absolute: 1 10*3/uL (ref 0.1–1.0)
Monocytes Relative: 10 %
Neutro Abs: 7.9 10*3/uL — ABNORMAL HIGH (ref 1.7–7.7)
Neutrophils Relative %: 73 %
Platelets: 333 10*3/uL (ref 150–400)
RBC: 4.09 MIL/uL (ref 3.87–5.11)
RDW: 14.7 % (ref 11.5–15.5)
WBC: 10.9 10*3/uL — ABNORMAL HIGH (ref 4.0–10.5)
nRBC: 0 % (ref 0.0–0.2)

## 2019-10-26 LAB — BRAIN NATRIURETIC PEPTIDE: B Natriuretic Peptide: 252.2 pg/mL — ABNORMAL HIGH (ref 0.0–100.0)

## 2019-10-26 LAB — TROPONIN I (HIGH SENSITIVITY)
Troponin I (High Sensitivity): 62 ng/L — ABNORMAL HIGH (ref <18)
Troponin I (High Sensitivity): 53 ng/L — ABNORMAL HIGH (ref <18)

## 2019-10-26 LAB — RESPIRATORY PANEL BY RT PCR (FLU A&B, COVID)
Influenza A by PCR: NEGATIVE
Influenza B by PCR: NEGATIVE
SARS Coronavirus 2 by RT PCR: NEGATIVE

## 2019-10-26 MED ORDER — ESCITALOPRAM OXALATE 10 MG PO TABS
10.0000 mg | ORAL_TABLET | Freq: Every day | ORAL | Status: DC
  Administered 2019-10-27 – 2019-10-31 (×5): 10 mg via ORAL
  Filled 2019-10-26 (×6): qty 1

## 2019-10-26 MED ORDER — APIXABAN 5 MG PO TABS
5.0000 mg | ORAL_TABLET | Freq: Two times a day (BID) | ORAL | Status: DC
  Administered 2019-10-26 – 2019-11-01 (×12): 5 mg via ORAL
  Filled 2019-10-26 (×3): qty 1
  Filled 2019-10-26: qty 2
  Filled 2019-10-26: qty 1
  Filled 2019-10-26: qty 2
  Filled 2019-10-26: qty 1
  Filled 2019-10-26: qty 2
  Filled 2019-10-26 (×4): qty 1

## 2019-10-26 MED ORDER — FUROSEMIDE 10 MG/ML IJ SOLN
40.0000 mg | Freq: Once | INTRAMUSCULAR | Status: AC
  Administered 2019-10-26: 40 mg via INTRAVENOUS
  Filled 2019-10-26: qty 4

## 2019-10-26 MED ORDER — LISINOPRIL 40 MG PO TABS
40.0000 mg | ORAL_TABLET | Freq: Every day | ORAL | Status: DC
  Administered 2019-10-27 – 2019-10-30 (×4): 40 mg via ORAL
  Filled 2019-10-26 (×2): qty 1
  Filled 2019-10-26: qty 4
  Filled 2019-10-26: qty 1

## 2019-10-26 MED ORDER — LEVOTHYROXINE SODIUM 100 MCG PO TABS
200.0000 ug | ORAL_TABLET | Freq: Every morning | ORAL | Status: DC
  Administered 2019-10-27: 200 ug via ORAL
  Filled 2019-10-26: qty 2

## 2019-10-26 MED ORDER — AMLODIPINE BESYLATE 10 MG PO TABS
10.0000 mg | ORAL_TABLET | Freq: Every day | ORAL | Status: DC
  Administered 2019-10-26 – 2019-10-27 (×2): 10 mg via ORAL
  Filled 2019-10-26 (×2): qty 2

## 2019-10-26 MED ORDER — HYDRALAZINE HCL 50 MG PO TABS
50.0000 mg | ORAL_TABLET | Freq: Two times a day (BID) | ORAL | Status: DC
  Administered 2019-10-26 – 2019-10-27 (×2): 50 mg via ORAL
  Filled 2019-10-26 (×2): qty 2

## 2019-10-26 NOTE — ED Provider Notes (Signed)
[facility] EMERGENCY DEPARTMENT Provider Note   CSN: [id number] Arrival date & time: 10/26/19  1557     History Chief Complaint  Patient presents with  . Cough    Erika [name] is a 86 y.o. female.  Presents to ER with concern for cough, shortness of breath.  Patient reports that over the last few days she has noted significant increase in her difficulty breathing.  States she normally has difficulty breathing with exertion however this seems to be worse.  She also over the last week he has noted some swelling to her lower legs.  Normally has mild swelling but this is worse than normal.  Patient has history of atrial fibrillation, on long-term anticoagulation.  Per review of chart Echocardiogram June 2013 LVEF 50 to 55%, aortic valve with moderate stenosis.   HPI     Past Medical History:  Diagnosis Date  . Abdominal pain   . Anemia   . ANEMIA 06/15/2009   Qualifier: Diagnosis of  By: Selena Batten CMA, Jewel    . Aortic stenosis    moderate AS by 06/2011 echo  . Arthritis   . Atrial fibrillation ([place]) 06/15/2009   Qualifier: Diagnosis of  By: Selena Batten CMA, Jewel    . Cardiac arrhythmia   . Gallstones, probable biliary colic 02/12/2351  . GERD (gastroesophageal reflux disease)   . H/O hiatal hernia   . Hyperlipidemia   . HYPERLIPIDEMIA 06/15/2009   Qualifier: Diagnosis of  By: Selena Batten CMA, Jewel    . Hypertension   . HYPERTENSION 06/15/2009   Qualifier: Diagnosis of  By: Selena Batten CMA, Jewel    . Hyperthyroidism   . Memory disorder 04/04/2016  . Obesity   . OBESITY 06/15/2009   Qualifier: Diagnosis of  By: Selena Batten CMA, Jewel    . Seasonal allergies   . SLEEP APNEA 06/16/2009   Qualifier: Diagnosis of  By: Rayann Heman, MD, Jeneen Rinks    . Unspecified hypothyroidism 06/15/2009   Qualifier: Diagnosis of  By: Selena Batten CMA, Jewel      Patient Active Problem List   Diagnosis Date Noted  . History of TIA (transient ischemic attack) 01/29/2019  . Bilateral leg edema 01/29/2019  . Memory disorder  04/04/2016  . Gallstones, probable biliary colic 61/44/3154  . GERD (gastroesophageal reflux disease) 05/09/2011  . SLEEP APNEA 06/16/2009  . UNSPECIFIED HYPOTHYROIDISM 06/15/2009  . Uncontrolled hypertension 06/15/2009  . OBESITY 06/15/2009  . ANEMIA 06/15/2009  . Essential hypertension 06/15/2009  . ATRIAL FIBRILLATION 06/15/2009    Past Surgical History:  Procedure Laterality Date  . BREAST SURGERY  2012 (June or July)   right  . CHOLECYSTECTOMY  06/28/2011   Procedure: LAPAROSCOPIC CHOLECYSTECTOMY WITH INTRAOPERATIVE CHOLANGIOGRAM;  Surgeon: Merrie Roof, MD;  Location: [facility];  Service: General;  Laterality: N/A;  . pharyngeal repair  [id number]   Dr Constance Holster for post-TEE pharygeal tear w neck abscess I&D  . THYROIDECTOMY  1961   80% removed     OB History   No obstetric history on file.     Family History  Problem Relation Age of Onset  . Heart disease Mother   . Heart disease Father   . Cancer Maternal Aunt        breast    Social History   Tobacco Use  . Smoking status: Never Smoker  . Smokeless tobacco: Never Used  Substance Use Topics  . Alcohol use: No  . Drug use: No    Home Medications Prior to Admission medications  Medication Sig Start Date End Date Taking? Authorizing Provider  amLODipine (NORVASC) 10 MG tablet Take 1 tablet (10 mg total) by mouth daily. 01/29/19  Yes Buford Dresser, MD  ELIQUIS 5 MG TABS tablet TAKE 1 TABLET BY MOUTH TWICE A DAY 05/25/19  Yes Buford Dresser, MD  ergocalciferol (VITAMIN D2) 50000 UNITS capsule Take 50,000 Units by mouth once a week. Take on thursday   Yes [provider]  escitalopram (LEXAPRO) 10 MG tablet TAKE 1 TABLET BY MOUTH DAILY TO HELP DEPRESSION 01/23/17  Yes [provider]  furosemide (LASIX) 20 MG tablet Take 10 mg by mouth.    Yes [provider]  hydrALAZINE (APRESOLINE) 50 MG tablet Take 1 tablet (50 mg total) by mouth in the morning and at bedtime. 04/08/19  Yes  Buford Dresser, MD  HYDROcodone-acetaminophen (NORCO/VICODIN) 5-325 MG tablet Take 1 tablet by mouth every 4 (four) hours as needed. 10/03/19  Yes Tacy Learn, PA-C  levothyroxine (SYNTHROID, LEVOTHROID) 175 MCG tablet Take 200 mcg by mouth every morning.    Yes [provider]  lisinopril (ZESTRIL) 40 MG tablet TAKE 1 TABLET BY MOUTH EVERY DAY 03/11/19  Yes Buford Dresser, MD  loratadine (CLARITIN) 10 MG tablet Take 10 mg by mouth daily as needed for allergies.   Yes [provider]  omega-3 acid ethyl esters (LOVAZA) 1 G capsule Take 1 g by mouth daily.     [provider]  omeprazole (PRILOSEC) 20 MG capsule Take 20 mg by mouth daily.    [provider]  Red Yeast Rice Extract (RED YEAST RICE PO) Take 2 tablets by mouth daily.     [provider]    Allergies    Celecoxib, Amiodarone, Dronedarone hydrochloride, Ezetimibe, Flecainide acetate, Namenda [memantine hcl], and Sulfonamide derivatives  Review of Systems   Review of Systems  Constitutional: Negative for chills and fever.  HENT: Negative for ear pain and sore throat.   Eyes: Negative for pain and visual disturbance.  Respiratory: Positive for cough and shortness of breath.   Cardiovascular: Positive for leg swelling. Negative for chest pain and palpitations.  Gastrointestinal: Negative for abdominal pain and vomiting.  Genitourinary: Negative for dysuria and hematuria.  Musculoskeletal: Negative for arthralgias and back pain.  Skin: Negative for color change and rash.  Neurological: Negative for seizures and syncope.  All other systems reviewed and are negative.   Physical Exam Updated Vital Signs BP (!) 169/54   Pulse 60   Temp 98.7 F (37.1 C) (Oral)   Resp 19   Ht 5\' 7"  (1.702 m)   Wt 74.8 kg   SpO2 96%   BMI 25.84 kg/m   Physical Exam Vitals and nursing note reviewed.  Constitutional:      General: She is not in acute distress.    Appearance: She  is well-developed.  HENT:     Head: Normocephalic and atraumatic.  Eyes:     Conjunctiva/sclera: Conjunctivae normal.  Cardiovascular:     Rate and Rhythm: Normal rate and regular rhythm.     Heart sounds: No murmur heard.   Pulmonary:     Effort: Pulmonary effort is normal. No respiratory distress.     Comments: Somewhat decreased breath sounds on left side Abdominal:     Palpations: Abdomen is soft.     Tenderness: There is no abdominal tenderness.  Musculoskeletal:     Cervical back: Neck supple.     Comments: Bilateral pitting edema throughout lower legs  Skin:  General: Skin is warm and dry.     Capillary Refill: Capillary refill takes less than 2 seconds.  Neurological:     General: No focal deficit present.     Mental Status: She is alert and oriented to person, place, and time.  Psychiatric:        Mood and Affect: Mood normal.     ED Results / Procedures / Treatments   Labs (all labs ordered are listed, but only abnormal results are displayed) Labs Reviewed  CBC WITH DIFFERENTIAL/PLATELET - Abnormal; Notable for the following components:      Result Value   WBC 10.9 (*)    Hemoglobin 11.6 (*)    HCT 34.2 (*)    Neutro Abs 7.9 (*)    All other components within normal limits  BASIC METABOLIC PANEL - Abnormal; Notable for the following components:   Sodium 124 (*)    Chloride 89 (*)    Glucose, Bld 115 (*)    GFR, Estimated 53 (*)    All other components within normal limits  BRAIN NATRIURETIC PEPTIDE - Abnormal; Notable for the following components:   B Natriuretic Peptide 252.2 (*)    All other components within normal limits  TROPONIN I (HIGH SENSITIVITY) - Abnormal; Notable for the following components:   Troponin I (High Sensitivity) 62 (*)    All other components within normal limits  RESPIRATORY PANEL BY RT PCR (FLU A&B, COVID)  TROPONIN I (HIGH SENSITIVITY)    EKG EKG Interpretation  Date/Time:  Monday October 26 2019 18:40:48  EDT Ventricular Rate:  68 PR Interval:    QRS Duration: 88 QT Interval:  398 QTC Calculation: 424 R Axis:   85 Text Interpretation: Atrial fibrillation Anterior infarct, old Nonspecific T abnormalities, lateral leads Confirmed by Madalyn Rob [phone]) on 10/26/2019 9:23:31 PM   Radiology DG Chest Port 1 View  Result Date: 10/26/2019 CLINICAL DATA:  Cough.  Shortness of breath with exertion. EXAM: PORTABLE CHEST 1 VIEW COMPARISON:  01/01/2012 FINDINGS: The cardiac silhouette remains enlarged. There is a new small to moderate-sized left pleural effusion with associated left basilar atelectasis, and there may be a trace right pleural effusion. There is central pulmonary vascular congestion without overt edema. No pneumothorax is identified. Thoracic dextroscoliosis is noted. IMPRESSION: 1. New left pleural effusion and left basilar atelectasis. 2. Cardiomegaly and pulmonary vascular congestion. Electronically Signed   By: Logan Bores M.D.   On: 10/26/2019 16:34    Procedures .Critical Care Performed by: Lucrezia Starch, MD Authorized by: Lucrezia Starch, MD   Critical care provider statement:    Critical care time (minutes):  35   Critical care was necessary to treat or prevent imminent or life-threatening deterioration of the following conditions:  Metabolic crisis, respiratory failure and cardiac failure   Critical care was time spent personally by me on the following activities:  Discussions with consultants, evaluation of patient's response to treatment, examination of patient, ordering and performing treatments and interventions, ordering and review of laboratory studies, ordering and review of radiographic studies, pulse oximetry, re-evaluation of patient's condition, obtaining history from patient or surrogate and review of old charts   (including critical care time)  Medications Ordered in ED Medications  furosemide (LASIX) injection 40 mg (has no administration in time  range)    ED Course  I have reviewed the triage vital signs and the nursing notes.  Pertinent labs & imaging results that were available during my care of the patient were reviewed  by me and considered in my medical decision making (see chart for details).    MDM Rules/Calculators/A&P                         86 year old lady presents to the emergency room with concern for shortness of breath.  On physical exam, patient noted to have mild tachypnea but no respiratory distress at rest.  However, patient had significant dyspnea with minimal exertion and borderline hypoxia. Per my review of chest x-ray and per radiology, patient has new left pleural effusion, some increased pulmonary congestion.  Based on this finding and her lower legs, concern for heart failure exacerbation.  EKG without obvious ischemic changes, initial troponin was mildly elevated.  Suspect this is related to her heart failure and lower suspicion for ACS.  Will trend troponins.  BNP elevated consistent with heart failure. BMP notable for hyponatremia.  For now, will fluid restrict, diuresis and monitor electrolytes.  If not improving with these measures, then would consider starting low hypertonic saline.  Will consult [facility] for admit.   Final Clinical Impression(s) / ED Diagnoses Final diagnoses:  Heart failure, unspecified HF chronicity, unspecified heart failure type ([place])  Pleural effusion  Hyponatremia    Rx / DC Orders ED Discharge Orders    None       Lucrezia Starch, MD 10/26/19 2129

## 2019-10-26 NOTE — ED Notes (Signed)
Pt states she did not take her BP medication today. Complaining of dyspnea on exertion as well as when lying flat. Family member states pt unable to walk far distances without dyspnea. Having coughing spells at home, describes as dry cough. Denies fevers. BLE edema noted, family states is new finding. Denies chest pain.

## 2019-10-26 NOTE — ED Notes (Signed)
Pt. Has been short of breath and coughing for the past 2-3 days with shortness of breath.

## 2019-10-26 NOTE — ED Notes (Signed)
Pt. Has had swelling in legs and has had for approx. A week worse than normal per daughter.  This for a week worse than normal.

## 2019-10-26 NOTE — ED Notes (Signed)
Pt. Just returned from walking to restroom and sat was 92% when she immediately returned

## 2019-10-26 NOTE — ED Notes (Signed)
ED Provider at bedside. 

## 2019-10-26 NOTE — ED Triage Notes (Signed)
Cough all the time d/t Lisinopril.  Worse since Friday (3 days).  Sob with exertion. [facility] from [facility].

## 2019-10-27 ENCOUNTER — Encounter (HOSPITAL_COMMUNITY): Payer: Self-pay | Admitting: Internal Medicine

## 2019-10-27 ENCOUNTER — Other Ambulatory Visit (HOSPITAL_COMMUNITY): Payer: Medicare Other

## 2019-10-27 DIAGNOSIS — Z7901 Long term (current) use of anticoagulants: Secondary | ICD-10-CM | POA: Diagnosis not present

## 2019-10-27 DIAGNOSIS — E871 Hypo-osmolality and hyponatremia: Secondary | ICD-10-CM

## 2019-10-27 DIAGNOSIS — N1831 Chronic kidney disease, stage 3a: Secondary | ICD-10-CM | POA: Diagnosis present

## 2019-10-27 DIAGNOSIS — E039 Hypothyroidism, unspecified: Secondary | ICD-10-CM | POA: Diagnosis present

## 2019-10-27 DIAGNOSIS — E876 Hypokalemia: Secondary | ICD-10-CM | POA: Diagnosis not present

## 2019-10-27 DIAGNOSIS — I509 Heart failure, unspecified: Secondary | ICD-10-CM

## 2019-10-27 DIAGNOSIS — G4730 Sleep apnea, unspecified: Secondary | ICD-10-CM | POA: Diagnosis present

## 2019-10-27 DIAGNOSIS — E785 Hyperlipidemia, unspecified: Secondary | ICD-10-CM | POA: Diagnosis present

## 2019-10-27 DIAGNOSIS — I130 Hypertensive heart and chronic kidney disease with heart failure and stage 1 through stage 4 chronic kidney disease, or unspecified chronic kidney disease: Secondary | ICD-10-CM | POA: Diagnosis present

## 2019-10-27 DIAGNOSIS — I10 Essential (primary) hypertension: Secondary | ICD-10-CM | POA: Diagnosis not present

## 2019-10-27 DIAGNOSIS — R059 Cough, unspecified: Secondary | ICD-10-CM | POA: Diagnosis present

## 2019-10-27 DIAGNOSIS — J90 Pleural effusion, not elsewhere classified: Secondary | ICD-10-CM

## 2019-10-27 DIAGNOSIS — Z20822 Contact with and (suspected) exposure to covid-19: Secondary | ICD-10-CM | POA: Diagnosis present

## 2019-10-27 DIAGNOSIS — Z8249 Family history of ischemic heart disease and other diseases of the circulatory system: Secondary | ICD-10-CM | POA: Diagnosis not present

## 2019-10-27 DIAGNOSIS — K219 Gastro-esophageal reflux disease without esophagitis: Secondary | ICD-10-CM | POA: Diagnosis present

## 2019-10-27 DIAGNOSIS — I482 Chronic atrial fibrillation, unspecified: Secondary | ICD-10-CM | POA: Diagnosis present

## 2019-10-27 DIAGNOSIS — Z888 Allergy status to other drugs, medicaments and biological substances status: Secondary | ICD-10-CM | POA: Diagnosis not present

## 2019-10-27 DIAGNOSIS — I082 Rheumatic disorders of both aortic and tricuspid valves: Secondary | ICD-10-CM | POA: Diagnosis present

## 2019-10-27 DIAGNOSIS — T502X5A Adverse effect of carbonic-anhydrase inhibitors, benzothiadiazides and other diuretics, initial encounter: Secondary | ICD-10-CM | POA: Diagnosis not present

## 2019-10-27 DIAGNOSIS — Z79899 Other long term (current) drug therapy: Secondary | ICD-10-CM | POA: Diagnosis not present

## 2019-10-27 DIAGNOSIS — R319 Hematuria, unspecified: Secondary | ICD-10-CM | POA: Diagnosis present

## 2019-10-27 DIAGNOSIS — Y92239 Unspecified place in hospital as the place of occurrence of the external cause: Secondary | ICD-10-CM | POA: Diagnosis not present

## 2019-10-27 DIAGNOSIS — Z882 Allergy status to sulfonamides status: Secondary | ICD-10-CM | POA: Diagnosis not present

## 2019-10-27 DIAGNOSIS — Z9049 Acquired absence of other specified parts of digestive tract: Secondary | ICD-10-CM | POA: Diagnosis not present

## 2019-10-27 DIAGNOSIS — I5031 Acute diastolic (congestive) heart failure: Secondary | ICD-10-CM | POA: Diagnosis not present

## 2019-10-27 DIAGNOSIS — I5033 Acute on chronic diastolic (congestive) heart failure: Secondary | ICD-10-CM | POA: Diagnosis present

## 2019-10-27 DIAGNOSIS — Z8673 Personal history of transient ischemic attack (TIA), and cerebral infarction without residual deficits: Secondary | ICD-10-CM | POA: Diagnosis not present

## 2019-10-27 DIAGNOSIS — Z79890 Hormone replacement therapy (postmenopausal): Secondary | ICD-10-CM | POA: Diagnosis not present

## 2019-10-27 LAB — BASIC METABOLIC PANEL
Anion gap: 12 (ref 5–15)
BUN: 12 mg/dL (ref 8–23)
CO2: 23 mmol/L (ref 22–32)
Calcium: 9 mg/dL (ref 8.9–10.3)
Chloride: 89 mmol/L — ABNORMAL LOW (ref 98–111)
Creatinine, Ser: 0.98 mg/dL (ref 0.44–1.00)
GFR, Estimated: 53 mL/min — ABNORMAL LOW (ref >60)
Glucose, Bld: 105 mg/dL — ABNORMAL HIGH (ref 70–99)
Potassium: 3.7 mmol/L (ref 3.5–5.1)
Sodium: 124 mmol/L — ABNORMAL LOW (ref 135–145)

## 2019-10-27 LAB — URINALYSIS, ROUTINE W REFLEX MICROSCOPIC
Bilirubin Urine: NEGATIVE
Glucose, UA: NEGATIVE mg/dL
Hgb urine dipstick: NEGATIVE
Ketones, ur: NEGATIVE mg/dL
Nitrite: NEGATIVE
Protein, ur: 100 mg/dL — AB
Specific Gravity, Urine: 1.009 (ref 1.005–1.030)
WBC, UA: >50 WBC/hpf — ABNORMAL HIGH (ref 0–5)
pH: 6 (ref 5.0–8.0)

## 2019-10-27 LAB — TSH: TSH: 0.043 u[IU]/mL — ABNORMAL LOW (ref 0.350–4.500)

## 2019-10-27 MED ORDER — ACETAMINOPHEN 325 MG PO TABS
650.0000 mg | ORAL_TABLET | ORAL | Status: DC | PRN
  Filled 2019-10-27: qty 2

## 2019-10-27 MED ORDER — SODIUM CHLORIDE 0.9% FLUSH: 3.0000 mL | INTRAVENOUS | Status: DC | PRN

## 2019-10-27 MED ORDER — HYDRALAZINE HCL 50 MG PO TABS
100.0000 mg | ORAL_TABLET | Freq: Two times a day (BID) | ORAL | Status: DC
  Administered 2019-10-28: 100 mg via ORAL
  Filled 2019-10-27: qty 2

## 2019-10-27 MED ORDER — INFLUENZA VAC A&B SA ADJ QUAD 0.5 ML IM PRSY
0.5000 mL | PREFILLED_SYRINGE | INTRAMUSCULAR | Status: DC
  Filled 2019-10-27: qty 0.5

## 2019-10-27 MED ORDER — LEVOTHYROXINE SODIUM 100 MCG PO TABS
200.0000 ug | ORAL_TABLET | Freq: Every day | ORAL | Status: DC
  Administered 2019-10-28 – 2019-11-01 (×5): 200 ug via ORAL
  Filled 2019-10-27 (×6): qty 2

## 2019-10-27 MED ORDER — ONDANSETRON HCL 4 MG/2ML IJ SOLN: 4.0000 mg | Freq: Four times a day (QID) | INTRAMUSCULAR | Status: DC | PRN

## 2019-10-27 MED ORDER — FUROSEMIDE 10 MG/ML IJ SOLN
40.0000 mg | Freq: Every day | INTRAMUSCULAR | Status: DC
  Administered 2019-10-27: 40 mg via INTRAVENOUS
  Filled 2019-10-27: qty 4

## 2019-10-27 MED ORDER — SODIUM CHLORIDE 0.9% FLUSH
3.0000 mL | Freq: Two times a day (BID) | INTRAVENOUS | Status: DC
  Administered 2019-10-28 – 2019-10-31 (×9): 3 mL via INTRAVENOUS

## 2019-10-27 MED ORDER — SODIUM CHLORIDE 0.9 % IV SOLN: 250.0000 mL | INTRAVENOUS | Status: DC | PRN

## 2019-10-27 NOTE — Plan of Care (Signed)
  Problem: Clinical Measurements: Goal: Respiratory complications will improve Outcome: Progressing   Problem: Safety: Goal: Ability to remain free from injury will improve Outcome: Progressing   

## 2019-10-27 NOTE — Plan of Care (Signed)
  Problem: Education: Goal: Knowledge of General Education information will improve Description: Including pain rating scale, medication(s)/side effects and non-pharmacologic comfort measures 10/27/2019 1636 by Abner Greenspan, RN Outcome: Progressing 10/27/2019 1635 by Abner Greenspan, RN Outcome: Progressing   Problem: Nutrition: Goal: Adequate nutrition will be maintained 10/27/2019 1636 by Abner Greenspan, RN Outcome: Progressing 10/27/2019 1635 by Abner Greenspan, RN Outcome: Progressing

## 2019-10-27 NOTE — H&P (Addendum)
History and Physical    Erika [name] IWL:[id number] DOB: April 07, 1932 DOA: 10/26/2019  PCP: Tamsen Roers, MD (Confirm with patient/family/NH records and if not entered, this has to be entered at [facility] point of entry) Patient coming from: Home  I have personally briefly reviewed patient's old medical records in [place]  Chief Complaint: SOB and leg swelling  HPI: Erika [name] is a 86 y.o. female with medical history significant of chronic diastolic CHF, refractory HTN, chronic A. fib, hypothyroidism, GERD presented with increasing shortness of breath and leg swelling.  Symptoms started about 1 month ago, gradually getting worse.  Denies any chest pain.  Dyspnea was initially exertional now became more frequent and now easily getting shortness of breath with minimal activity, no cough. But yesterday morning, patient complained about "unable to breath" and was found in fast shallow breathing and wheezing, and was not able to take her BP meds. On arrival at [facility] ED, it was found that pt's BP in 200s.  Daughter at bedside reported patient had mild hematuria overnight after given Lasix. Pt reported no dysuria. ED Course: Chest x-ray showed pulmonary congestion and left sided pleural effusion.  Patient was given 40 mg Lasix IV symptoms improved overnight.  Blood work BMP: Sodium 124, potassium 4.0, creatinine 0.9.  Review of Systems: As per HPI otherwise 14 point review of systems negative.    Past Medical History:  Diagnosis Date  . Abdominal pain   . Anemia   . ANEMIA 06/15/2009   Qualifier: Diagnosis of  By: Selena Batten CMA, Jewel    . Aortic stenosis    moderate AS by 06/2011 echo  . Arthritis   . Atrial fibrillation ([place]) 06/15/2009   Qualifier: Diagnosis of  By: Selena Batten CMA, Jewel    . Cardiac arrhythmia   . Gallstones, probable biliary colic 01/15/4079  . GERD (gastroesophageal reflux disease)   . H/O hiatal hernia   . Hyperlipidemia   . HYPERLIPIDEMIA 06/15/2009   Qualifier: Diagnosis  of  By: Selena Batten CMA, Jewel    . Hypertension   . HYPERTENSION 06/15/2009   Qualifier: Diagnosis of  By: Selena Batten CMA, Jewel    . Hyperthyroidism   . Memory disorder 04/04/2016  . Obesity   . OBESITY 06/15/2009   Qualifier: Diagnosis of  By: Selena Batten CMA, Jewel    . Seasonal allergies   . SLEEP APNEA 06/16/2009   Qualifier: Diagnosis of  By: Rayann Heman, MD, Jeneen Rinks    . Unspecified hypothyroidism 06/15/2009   Qualifier: Diagnosis of  By: Selena Batten CMA, Jewel      Past Surgical History:  Procedure Laterality Date  . BREAST SURGERY  2012 (June or July)   right  . CHOLECYSTECTOMY  06/28/2011   Procedure: LAPAROSCOPIC CHOLECYSTECTOMY WITH INTRAOPERATIVE CHOLANGIOGRAM;  Surgeon: Merrie Roof, MD;  Location: [facility];  Service: General;  Laterality: N/A;  . pharyngeal repair  [id number]   Dr Constance Holster for post-TEE pharygeal tear w neck abscess I&D  . THYROIDECTOMY  1961   80% removed     reports that she has never smoked. She has never used smokeless tobacco. She reports that she does not drink alcohol and does not use drugs.  Allergies  Allergen Reactions  . Celecoxib Hives and Itching    Face only  . Amiodarone     Ineffective   . Dronedarone Hydrochloride     Edema   . Ezetimibe     Fatigue   . Flecainide Acetate     fatigue  .  Namenda [Memantine Hcl] Other (See Comments)    Slept alot, felt terrible  . Sulfonamide Derivatives Rash    "Mouth breaks out"    Family History  Problem Relation Age of Onset  . Heart disease Mother   . Heart disease Father   . Cancer Maternal Aunt        breast     Prior to Admission medications   Medication Sig Start Date End Date Taking? Authorizing Provider  amLODipine (NORVASC) 10 MG tablet Take 1 tablet (10 mg total) by mouth daily. 01/29/19  Yes Buford Dresser, MD  ELIQUIS 5 MG TABS tablet TAKE 1 TABLET BY MOUTH TWICE A DAY 05/25/19  Yes Buford Dresser, MD  ergocalciferol (VITAMIN D2) 50000 UNITS capsule Take 50,000 Units by mouth once a week.  Take on thursday   Yes [provider]  escitalopram (LEXAPRO) 10 MG tablet TAKE 1 TABLET BY MOUTH DAILY TO HELP DEPRESSION 01/23/17  Yes [provider]  furosemide (LASIX) 20 MG tablet Take 10 mg by mouth.    Yes [provider]  hydrALAZINE (APRESOLINE) 50 MG tablet Take 1 tablet (50 mg total) by mouth in the morning and at bedtime. 04/08/19  Yes Buford Dresser, MD  HYDROcodone-acetaminophen (NORCO/VICODIN) 5-325 MG tablet Take 1 tablet by mouth every 4 (four) hours as needed. 10/03/19  Yes Tacy Learn, PA-C  levothyroxine (SYNTHROID, LEVOTHROID) 175 MCG tablet Take 200 mcg by mouth every morning.    Yes [provider]  lisinopril (ZESTRIL) 40 MG tablet TAKE 1 TABLET BY MOUTH EVERY DAY 03/11/19  Yes Buford Dresser, MD  loratadine (CLARITIN) 10 MG tablet Take 10 mg by mouth daily as needed for allergies.   Yes [provider]  omega-3 acid ethyl esters (LOVAZA) 1 G capsule Take 1 g by mouth daily.     [provider]  omeprazole (PRILOSEC) 20 MG capsule Take 20 mg by mouth daily.    [provider]  Red Yeast Rice Extract (RED YEAST RICE PO) Take 2 tablets by mouth daily.     [provider]    Physical Exam: Vitals:   10/27/19 1230 10/27/19 1245 10/27/19 1300 10/27/19 1359  BP: (!) 150/102  (!) 158/53 (!) 160/57  Pulse:  (!) 59 (!) 56 (!) 59  Resp: 16 (!) 23 19 16   Temp:    97.8 F (36.6 C)  TempSrc:    Oral  SpO2:  98% 99%   Weight:      Height:        Constitutional: NAD, calm, comfortable Vitals:   10/27/19 1230 10/27/19 1245 10/27/19 1300 10/27/19 1359  BP: (!) 150/102  (!) 158/53 (!) 160/57  Pulse:  (!) 59 (!) 56 (!) 59  Resp: 16 (!) 23 19 16   Temp:    97.8 F (36.6 C)  TempSrc:    Oral  SpO2:  98% 99%   Weight:      Height:       Eyes: PERRL, lids and conjunctivae normal ENMT: Mucous membranes are moist. Posterior pharynx clear of any exudate or lesions.Normal dentition.  Neck:  normal, supple, no masses, no thyromegaly Respiratory: clear to auscultation bilaterally, no wheezing, no crackles. Normal respiratory effort. No accessory muscle use.  Cardiovascular: Irregular rate, Systolic murmur on heart base. 2+ extremity edema. 2+ pedal pulses. No carotid bruits.  Abdomen: no tenderness, no masses palpated. No hepatosplenomegaly. Bowel sounds positive.  Musculoskeletal: no clubbing / cyanosis. No joint deformity upper and lower extremities. Good ROM,  no contractures. Normal muscle tone.  Skin: no rashes, lesions, ulcers. No induration Neurologic: CN 2-12 grossly intact. Sensation intact, DTR normal. Strength 5/5 in all 4.  Psychiatric: Normal judgment and insight. Alert and oriented x 3. Normal mood.     Labs on Admission: I have personally reviewed following labs and imaging studies  CBC: Recent Labs  Lab 10/26/19 1939  WBC 10.9*  NEUTROABS 7.9*  HGB 11.6*  HCT 34.2*  MCV 83.6  PLT 245   Basic Metabolic Panel: Recent Labs  Lab 10/26/19 1939  NA 124*  K 4.0  CL 89*  CO2 23  GLUCOSE 115*  BUN 16  CREATININE 0.97  CALCIUM 9.6   GFR: Estimated Creatinine Clearance: 44.8 mL/min (by C-G formula based on SCr of 0.97 mg/dL). Liver Function Tests: No results for input(s): AST, ALT, ALKPHOS, BILITOT, PROT, ALBUMIN in the last 168 hours. No results for input(s): LIPASE, AMYLASE in the last 168 hours. No results for input(s): AMMONIA in the last 168 hours. Coagulation Profile: No results for input(s): INR, PROTIME in the last 168 hours. Cardiac Enzymes: No results for input(s): CKTOTAL, CKMB, CKMBINDEX, TROPONINI in the last 168 hours. BNP (last 3 results) No results for input(s): PROBNP in the last 8760 hours. HbA1C: No results for input(s): HGBA1C in the last 72 hours. CBG: No results for input(s): GLUCAP in the last 168 hours. Lipid Profile: No results for input(s): CHOL, HDL, LDLCALC, TRIG, CHOLHDL, LDLDIRECT in the last 72 hours. Thyroid  Function Tests: No results for input(s): TSH, T4TOTAL, FREET4, T3FREE, THYROIDAB in the last 72 hours. Anemia Panel: No results for input(s): VITAMINB12, FOLATE, FERRITIN, TIBC, IRON, RETICCTPCT in the last 72 hours. Urine analysis:    Component Value Date/Time   COLORURINE YELLOW 01/01/2012 2347   APPEARANCEUR CLEAR 01/01/2012 2347   LABSPEC 1.007 01/01/2012 2347   PHURINE 5.5 01/01/2012 2347   GLUCOSEU NEGATIVE 01/01/2012 2347   HGBUR NEGATIVE 01/01/2012 2347   BILIRUBINUR negative 06/17/2015 1613   BILIRUBINUR neg 04/20/2012 1054   KETONESUR negative 06/17/2015 [place] 01/01/2012 2347   PROTEINUR negative 06/17/2015 1613   PROTEINUR neg 04/20/2012 1054   PROTEINUR NEGATIVE 01/01/2012 2347   UROBILINOGEN 0.2 06/17/2015 1613   UROBILINOGEN 0.2 01/01/2012 2347   NITRITE Negative 06/17/2015 1613   NITRITE neg 04/20/2012 1054   NITRITE NEGATIVE 01/01/2012 2347   LEUKOCYTESUR Negative 06/17/2015 1613    Radiological Exams on Admission: DG Chest Port 1 View  Result Date: 10/26/2019 CLINICAL DATA:  Cough.  Shortness of breath with exertion. EXAM: PORTABLE CHEST 1 VIEW COMPARISON:  01/01/2012 FINDINGS: The cardiac silhouette remains enlarged. There is a new small to moderate-sized left pleural effusion with associated left basilar atelectasis, and there may be a trace right pleural effusion. There is central pulmonary vascular congestion without overt edema. No pneumothorax is identified. Thoracic dextroscoliosis is noted. IMPRESSION: 1. New left pleural effusion and left basilar atelectasis. 2. Cardiomegaly and pulmonary vascular congestion. Electronically Signed   By: Logan Bores M.D.   On: 10/26/2019 16:34    EKG: Independently reviewed.  A. fib, nonspecific T wave changes multiple chest leads  Assessment/Plan Active Problems:   CHF (congestive heart failure) ([place])  (please populate well all problems here in Problem List. (For example, if patient is on BP meds  at home and you resume or decide to hold them, it is a problem that needs to be her. Same for CAD, COPD, HLD and so on)  Acute on chronic diastolic  CHF decompensation -Still has signs of fluid overload, symptoms responded somewhat to IV Lasix.  Continue daily 40 mg diuretics IV.  Repeat echo -Blood pressure still significantly elevated, will titrate up her hydralazine, discussed with daughter at bedside, patient unable to be compliant with 3 times a day hydralazine, will increase her hydralazine from 10 mg twice daily to 100 mg twice daily. -Appears that the patient has been taking 10 mg of Lasix daily, upon discharge will recommend increasing to 20 mg daily.  Discussed with daughter at bedside who understood and agreed. -Repeat chest x-ray tomorrow  Hyponatremia acute on chronic -Baseline line 127-134, yesterday's reading 124 -With underlying hypervolumia secondary to CHF decompensation and fluid overload -Continue daily Lasix -Daily BMP -Fluid restriction 1800 mL  Left pleural effusion -Likely from CHF, plan to treat with aggressive diuresis.  Uncontrolled hypertension -As above  Chronic A. Fib -Rate controlled, borderline bradycardia, avoid beta blocker. -Continue Eliquis  Brief hematuria -Check UA -Continue Eliquis  Hx of moderate aortic stenosis (Most recent Echo 2013) -Denied any syncope, angina, and her CHF likely from uncontrolled hypertension and fluid overload -Recheck echo.  Hypothyroidism -Continue Synthroid  DVT prophylaxis: Eliquis Code Status: Full Code Family Communication: Daughter at bedside Disposition Plan: Expect more than 2 midnight hospital stay to treat CHF decompensation and correct hyponatremia Consults called: None Admission status: Tele admit   Lequita Halt MD [facility] Pager [phone]  10/27/2019, 3:09 PM

## 2019-10-27 NOTE — ED Notes (Signed)
Report given to floor

## 2019-10-27 NOTE — Discharge Instructions (Signed)

## 2019-10-28 ENCOUNTER — Inpatient Hospital Stay (HOSPITAL_COMMUNITY): Payer: Medicare Other

## 2019-10-28 DIAGNOSIS — I5033 Acute on chronic diastolic (congestive) heart failure: Secondary | ICD-10-CM

## 2019-10-28 DIAGNOSIS — I5031 Acute diastolic (congestive) heart failure: Secondary | ICD-10-CM | POA: Diagnosis not present

## 2019-10-28 DIAGNOSIS — I10 Essential (primary) hypertension: Secondary | ICD-10-CM

## 2019-10-28 DIAGNOSIS — E871 Hypo-osmolality and hyponatremia: Secondary | ICD-10-CM

## 2019-10-28 DIAGNOSIS — I509 Heart failure, unspecified: Secondary | ICD-10-CM

## 2019-10-28 LAB — BASIC METABOLIC PANEL
Anion gap: 9 (ref 5–15)
BUN: 15 mg/dL (ref 8–23)
CO2: 25 mmol/L (ref 22–32)
Calcium: 8.6 mg/dL — ABNORMAL LOW (ref 8.9–10.3)
Chloride: 91 mmol/L — ABNORMAL LOW (ref 98–111)
Creatinine, Ser: 1.13 mg/dL — ABNORMAL HIGH (ref 0.44–1.00)
GFR, Estimated: 44 mL/min — ABNORMAL LOW (ref >60)
Glucose, Bld: 82 mg/dL (ref 70–99)
Potassium: 3.6 mmol/L (ref 3.5–5.1)
Sodium: 125 mmol/L — ABNORMAL LOW (ref 135–145)

## 2019-10-28 LAB — ECHOCARDIOGRAM COMPLETE
AR max vel: 1.21 cm2
AV Area VTI: 1.26 cm2
AV Area mean vel: 1.28 cm2
AV Mean grad: 18.5 mmHg
AV Peak grad: 34 mmHg
Ao pk vel: 2.92 m/s
Area-P 1/2: 5.13 cm2
Height: 67 in
S' Lateral: 4 cm
Weight: 2448 oz

## 2019-10-28 MED ORDER — FUROSEMIDE 10 MG/ML IJ SOLN
40.0000 mg | Freq: Two times a day (BID) | INTRAMUSCULAR | Status: DC
  Administered 2019-10-28 – 2019-10-29 (×4): 40 mg via INTRAVENOUS
  Filled 2019-10-28 (×4): qty 4

## 2019-10-28 MED ORDER — ADULT MULTIVITAMIN W/MINERALS CH
1.0000 | ORAL_TABLET | Freq: Every day | ORAL | Status: DC
  Administered 2019-10-28 – 2019-11-01 (×5): 1 via ORAL
  Filled 2019-10-28 (×4): qty 1

## 2019-10-28 MED ORDER — ENSURE ENLIVE PO LIQD
237.0000 mL | Freq: Two times a day (BID) | ORAL | Status: DC
  Administered 2019-10-29 – 2019-11-01 (×5): 237 mL via ORAL

## 2019-10-28 NOTE — Evaluation (Addendum)
Physical Therapy Evaluation Patient Details Name: Erika [name] MRN: [id number] DOB: Feb 27, 1932 Today's Date: 10/28/2019   History of Present Illness  Erika [name] is an 86 y.o. female past medical history significant for chronic diastolic heart failure refractory hypertension, chronic atrial fibrillation on Eliquis, hypothyroidism comes in for 1 month of shortness of breath and increased lower extremity swelling.  In the ED chest x-ray was done that showed bilateral pulmonary infiltrates and left-sided pleural effusion was given IV Lasix. Admitted with acute on chronic CHF.  Clinical Impression  Prior to admission, pt lives with her spouse and states she is independent with ADL's/mobility. Of note, pt oriented to self and place, but not oriented to time and situation. Per chart review, pt with history of "memory disorder." Pt ambulating 160 feet with a walker at a min guard assist level, SpO2 96%, HR 57-81 bpm. Pt displays shuffle type gait pattern with decreased bilateral foot clearance. Pt reports "not feeling well," but unable to further describe symptoms. Will benefit from follow up HHPT to address deficits and maximize functional mobility.     Follow Up Recommendations Home health PT;Supervision/Assistance - 24 hour    Equipment Recommendations  None recommended by PT    Recommendations for Other Services   OT consul    Precautions / Restrictions Precautions Precautions: Fall Restrictions Weight Bearing Restrictions: No      Mobility  Bed Mobility Overal bed mobility: Needs Assistance Bed Mobility: Supine to Sit     Supine to sit: Supervision     General bed mobility comments: HOB slightly elevated    Transfers Overall transfer level: Needs assistance Equipment used: Rolling walker (2 wheeled) Transfers: Sit to/from Stand Sit to Stand: Supervision            Ambulation/Gait Ambulation/Gait assistance: Min guard Gait Distance (Feet): 160 Feet Assistive  device: Rolling walker (2 wheeled) Gait Pattern/deviations: Step-through pattern;Decreased stride length;Shuffle;Decreased dorsiflexion - right;Decreased dorsiflexion - left Gait velocity: decreased Gait velocity interpretation: <1.8 ft/sec, indicate of risk for recurrent falls General Gait Details: Pt with shuffle type gait pattern, decreased bilateral foot clearance and heel strike at initial contact, cues for walker proximity. Min guard for safety, no overt LOB  Stairs            Wheelchair Mobility    Modified Rankin (Stroke Patients Only)       Balance Overall balance assessment: Needs assistance Sitting-balance support: Feet supported Sitting balance-Leahy Scale: Good     Standing balance support: Bilateral upper extremity supported Standing balance-Leahy Scale: Poor                               Pertinent Vitals/Pain Pain Assessment: No/denies pain    Home Living Family/patient expects to be discharged to:: Private residence Living Arrangements: Spouse/significant other Available Help at Discharge: Family;Available 24 hours/day Type of Home: House Home Access: Stairs to enter   [employer] of Steps: 1 Home Layout: One level Home Equipment: Walker - 2 wheels;Shower seat      Prior Function Level of Independence: Independent with assistive device(s)         Comments: Uses walker intermittently, denies history of falls. Unsure of accuracy given cognitive impairment     Hand Dominance        Extremity/Trunk Assessment   Upper Extremity Assessment Upper Extremity Assessment: Overall WFL for tasks assessed    Lower Extremity Assessment Lower Extremity Assessment: RLE deficits/detail;LLE deficits/detail RLE Deficits /  Details: Grossly 4/5 LLE Deficits / Details: Grossly 4/5    Cervical / Trunk Assessment Cervical / Trunk Assessment: Kyphotic  Communication   Communication: No difficulties  Cognition Arousal/Alertness:  Awake/alert Behavior During Therapy: WFL for tasks assessed/performed Overall Cognitive Status: History of cognitive impairments - at baseline                                 General Comments: Pt with history of "memory disorder," per chart review. Pt oriented to self, place, not oriented to time. Able to correctly guess October with choices; unable to state year. Pt repeatedly asking, "did I come here because I passed out?" Follows all 1 step commands       General Comments      Exercises     Assessment/Plan    PT Assessment Patient needs continued PT services  PT Problem List Decreased strength;Decreased activity tolerance;Decreased balance;Decreased mobility;Decreased cognition       PT Treatment Interventions DME instruction;Gait training;Functional mobility training;Therapeutic exercise;Therapeutic activities;Balance training;Stair training;Patient/family education    PT Goals (Current goals can be found in the Care Plan section)  Acute Rehab PT Goals Patient Stated Goal: did not state PT Goal Formulation: With patient Time For Goal Achievement: 11/11/19 Potential to Achieve Goals: Good    Frequency Min 3X/week   Barriers to discharge        Co-evaluation               AM-PAC PT "6 Clicks" Mobility  Outcome Measure Help needed turning from your back to your side while in a flat bed without using bedrails?: None Help needed moving from lying on your back to sitting on the side of a flat bed without using bedrails?: None Help needed moving to and from a bed to a chair (including a wheelchair)?: None Help needed standing up from a chair using your arms (e.g., wheelchair or bedside chair)?: None Help needed to walk in hospital room?: A Little Help needed climbing 3-5 steps with a railing? : A Lot 6 Click Score: 21    End of Session Equipment Utilized During Treatment: Gait belt Activity Tolerance: Patient tolerated treatment well Patient left:  in chair;with call bell/phone within reach;with chair alarm set Nurse Communication: Mobility status PT Visit Diagnosis: Unsteadiness on feet (R26.81);Other abnormalities of gait and mobility (R26.89);Difficulty in walking, not elsewhere classified (R26.2)    Time: 4696-2952 PT Time Calculation (min) (ACUTE ONLY): 25 min   Charges:   PT Evaluation $PT Eval Moderate Complexity: 1 Mod PT Treatments $Gait Training: 8-22 mins        Wyona Almas, PT, DPT Acute Rehabilitation Services Pager [phone] Office [phone]   Deno Etienne 10/28/2019, 11:01 AM

## 2019-10-28 NOTE — Progress Notes (Signed)
°  Echocardiogram 2D Echocardiogram has been performed.  Erika [name] 10/28/2019, 11:42 AM

## 2019-10-28 NOTE — Progress Notes (Addendum)
[facility] PROGRESS NOTE    Progress Note  Erika [name]  YDX:[id number] DOB: [birth year] DOA: 10/26/2019 PCP: Tamsen Roers, MD     Brief Narrative:   Erika [name] is an 86 y.o. female past medical history significant for chronic diastolic heart failure refractory hypertension, chronic atrial fibrillation on Eliquis, hypothyroidism comes in for 1 month of shortness of breath and increased lower extremity swelling.  In the ED chest x-ray was done that showed bilateral pulmonary infiltrates and left-sided pleural effusion was given IV Lasix.  Assessment/Plan:   Acute on chronic diastolic heart failure: She appears fluid overloaded on physical exam, with positive JVD and lower extremity edema. She was started on IV Lasix. Her blood pressure is improved she is currently on lisinopril at this point will discontinue hydralazine and Norvasc and increase her Lasix. There has been mild increase in her creatinine will continue to IV diuresis. Continue strict I's and O's and daily standing weights restrict her fluids to 1800.  Hypervolemic hyponatremia: Back in February 2020 her sodium was 134, will continue aggressive IV diuresis recheck a basic metabolic panel daily.  Left-sided pleural effusion: Likely due to heart failure continue IV diuresis.    Essential hypertension: Continue IV diuresis and ARB.  Chronic atrial fibrillation: Rate controlled on Toprol continue Eliquis.  History of moderate aortic stenosis: She denies any syncope anginal symptoms, 2D echo is pending.  Hypothyroidism: Continue Synthroid.  CKD IIIa: Cr at baseline cont to monitor, with diuresis.   DVT prophylaxis: Eliquis Family Communication:none Status is: Inpatient  Remains inpatient appropriate because:Hemodynamically unstable   Dispo: The patient is from: SNF              Anticipated d/c is to: SNF              Anticipated d/c date is: 3 days              Patient currently is not medically  stable to d/c.        Code Status:     Code Status Orders  (From admission, onward)         Start     Ordered   10/27/19 1502  Full code  Continuous        10/27/19 1503        Code Status History    This patient has a current code status but no historical code status.   Advance Care Planning Activity        IV Access:    Peripheral IV   Procedures and diagnostic studies:   DG Chest Port 1 View  Result Date: 10/28/2019 CLINICAL DATA:  Congestive heart failure EXAM: PORTABLE CHEST 1 VIEW COMPARISON:  10/26/2019 FINDINGS: Diffuse cardiac enlargement. Small bilateral pleural effusions. Pulmonary vascular congestion with interstitial infiltrates, likely representing edema. Mild progression since prior study. Calcification of the aorta. IMPRESSION: Cardiac enlargement with pulmonary vascular congestion, interstitial edema, and small bilateral pleural effusions. Electronically Signed   By: Lucienne Capers M.D.   On: 10/28/2019 05:52   DG Chest Port 1 View  Result Date: 10/26/2019 CLINICAL DATA:  Cough.  Shortness of breath with exertion. EXAM: PORTABLE CHEST 1 VIEW COMPARISON:  01/01/2012 FINDINGS: The cardiac silhouette remains enlarged. There is a new small to moderate-sized left pleural effusion with associated left basilar atelectasis, and there may be a trace right pleural effusion. There is central pulmonary vascular congestion without overt edema. No pneumothorax is identified. Thoracic dextroscoliosis is noted. IMPRESSION: 1. New left pleural effusion  and left basilar atelectasis. 2. Cardiomegaly and pulmonary vascular congestion. Electronically Signed   By: Logan Bores M.D.   On: 10/26/2019 16:34     Medical Consultants:    None.  Anti-Infectives:   none  Subjective:    Erika [name] she relates her breathing is not improved still relates orthopnea.  Objective:    Vitals:   10/27/19 1359 10/27/19 1941 10/28/19 0021 10/28/19 0324  BP: (!)  160/57 (!) 155/49 (!) 125/45 (!) 120/43  Pulse: (!) 59 (!) 54 (!) 49 (!) 53  Resp: 16 20 16 17   Temp: 97.8 F (36.6 C) 98.5 F (36.9 C) 98.4 F (36.9 C) 98.6 F (37 C)  TempSrc: Oral Oral Oral Oral  SpO2: 99% 96% 93% 99%  Weight: 69.6 kg  69.4 kg   Height: 5\' 7"  (1.702 m)      SpO2: 99 %   Intake/Output Summary (Last 24 hours) at 10/28/2019 0747 Last data filed at 10/28/2019 0300 Gross per 24 hour  Intake 240 ml  Output 400 ml  Net -160 ml   Filed Weights   10/26/19 1606 10/27/19 1359 10/28/19 0021  Weight: 74.8 kg 69.6 kg 69.4 kg    Exam: General exam: In no acute distress. Respiratory system: Good air movement and decreased sounds on the left side Cardiovascular system: S1 & S2 heard, RRR.  Positive JVD Gastrointestinal system: Abdomen is nondistended, soft and nontender.  Extremities: 1+ edema Skin: No rashes, lesions or ulcers Psychiatry: Judgement and insight appear normal. Mood & affect appropriate.    Data Reviewed:    Labs: Basic Metabolic Panel: Recent Labs  Lab 10/26/19 1939 10/26/19 1939 10/27/19 1456 10/28/19 0400  NA 124*  --  124* 125*  K 4.0   < > 3.7 3.6  CL 89*  --  89* 91*  CO2 23  --  23 25  GLUCOSE 115*  --  105* 82  BUN 16  --  12 15  CREATININE 0.97  --  0.98 1.13*  CALCIUM 9.6  --  9.0 8.6*   < > = values in this interval not displayed.   GFR Estimated Creatinine Clearance: 35.4 mL/min (A) (by C-G formula based on SCr of 1.13 mg/dL (H)). Liver Function Tests: No results for input(s): AST, ALT, ALKPHOS, BILITOT, PROT, ALBUMIN in the last 168 hours. No results for input(s): LIPASE, AMYLASE in the last 168 hours. No results for input(s): AMMONIA in the last 168 hours. Coagulation profile No results for input(s): INR, PROTIME in the last 168 hours. COVID-19 Labs  No results for input(s): DDIMER, FERRITIN, LDH, CRP in the last 72 hours.  Lab Results  Component Value Date   [place] NEGATIVE 10/26/2019    CBC: Recent  Labs  Lab 10/26/19 1939  WBC 10.9*  NEUTROABS 7.9*  HGB 11.6*  HCT 34.2*  MCV 83.6  PLT 333   Cardiac Enzymes: No results for input(s): CKTOTAL, CKMB, CKMBINDEX, TROPONINI in the last 168 hours. BNP (last 3 results) No results for input(s): PROBNP in the last 8760 hours. CBG: No results for input(s): GLUCAP in the last 168 hours. D-Dimer: No results for input(s): DDIMER in the last 72 hours. Hgb A1c: No results for input(s): HGBA1C in the last 72 hours. Lipid Profile: No results for input(s): CHOL, HDL, LDLCALC, TRIG, CHOLHDL, LDLDIRECT in the last 72 hours. Thyroid function studies: Recent Labs    10/27/19 1456  TSH 0.043*   Anemia work up: No results for input(s): VITAMINB12, FOLATE, FERRITIN, TIBC, IRON,  RETICCTPCT in the last 72 hours. Sepsis Labs: Recent Labs  Lab 10/26/19 1939  WBC 10.9*   Microbiology Recent Results (from the past 240 hour(s))  Respiratory Panel by RT PCR (Flu A&B, Covid) - Nasopharyngeal Swab     Status: None   Collection Time: 10/26/19  9:51 PM   Specimen: Nasopharyngeal Swab  Result Value Ref Range Status   SARS Coronavirus 2 by RT PCR NEGATIVE NEGATIVE Final    Comment: (NOTE) SARS-CoV-2 target nucleic acids are NOT DETECTED.  The SARS-CoV-2 RNA is generally detectable in upper respiratoy specimens during the acute phase of infection. The lowest concentration of SARS-CoV-2 viral copies this assay can detect is 131 copies/mL. A negative result does not preclude SARS-Cov-2 infection and should not be used as the sole basis for treatment or other patient management decisions. A negative result may occur with  improper specimen collection/handling, submission of specimen other than nasopharyngeal swab, presence of viral mutation(s) within the areas targeted by this assay, and inadequate number of viral copies (<131 copies/mL). A negative result must be combined with clinical observations, patient history, and epidemiological information.  The expected result is Negative.  Fact Sheet for Patients:  PinkCheek.be  Fact Sheet for Healthcare Providers:  GravelBags.it  This test is no t yet approved or cleared by the [place] FDA and  has been authorized for detection and/or diagnosis of SARS-CoV-2 by FDA under an Emergency Use Authorization (EUA). This EUA will remain  in effect (meaning this test can be used) for the duration of the COVID-19 declaration under Section 564(b)(1) of the Act, 21 U.S.C. section 360bbb-3(b)(1), unless the authorization is terminated or revoked sooner.     Influenza A by PCR NEGATIVE NEGATIVE Final   Influenza B by PCR NEGATIVE NEGATIVE Final    Comment: (NOTE) The Xpert Xpress SARS-CoV-2/FLU/RSV assay is intended as an aid in  the diagnosis of influenza from Nasopharyngeal swab specimens and  should not be used as a sole basis for treatment. Nasal washings and  aspirates are unacceptable for Xpert Xpress SARS-CoV-2/FLU/RSV  testing.  Fact Sheet for Patients: PinkCheek.be  Fact Sheet for Healthcare Providers: GravelBags.it  This test is not yet approved or cleared by the [place] FDA and  has been authorized for detection and/or diagnosis of SARS-CoV-2 by  FDA under an Emergency Use Authorization (EUA). This EUA will remain  in effect (meaning this test can be used) for the duration of the  Covid-19 declaration under Section 564(b)(1) of the Act, 21  U.S.C. section 360bbb-3(b)(1), unless the authorization is  terminated or revoked. Performed at [facility], [address] [place], [place] [address]      Medications:   . amLODipine  10 mg Oral Daily  . apixaban  5 mg Oral BID  . escitalopram  10 mg Oral Daily  . furosemide  40 mg Intravenous Daily  . hydrALAZINE  100 mg Oral BID  . influenza vaccine adjuvanted  0.5 mL Intramuscular  Tomorrow-1000  . levothyroxine  200 mcg Oral Q0600  . lisinopril  40 mg Oral Daily  . sodium chloride flush  3 mL Intravenous Q12H   Continuous Infusions: . sodium chloride        LOS: 1 day   Charlynne Cousins  [facility]  10/28/2019, 7:47 AM

## 2019-10-28 NOTE — Progress Notes (Signed)
Initial Nutrition Assessment  DOCUMENTATION CODES:   Not applicable  INTERVENTION:   -Ensure Enlive po BID, each supplement provides 350 kcal and 20 grams of protein -MVI with minerals daily  NUTRITION DIAGNOSIS:   Inadequate oral intake related to decreased appetite as evidenced by per patient/family report.  GOAL:   Patient will meet greater than or equal to 90% of their needs   MONITOR:   PO intake, Supplement acceptance, Labs, Weight trends, Skin, I & O's  REASON FOR ASSESSMENT:   Malnutrition Screening Tool    ASSESSMENT:   Erika [name] is a 86 y.o. female with medical history significant of chronic diastolic CHF, refractory HTN, chronic A. fib, hypothyroidism, GERD presented with increasing shortness of breath and leg swelling.  Symptoms started about 1 month ago, gradually getting worse.  Pt admitted with CHF.   Reviewed I/O's: -160 ml x 24 hours  UOP: 400 ml x 24 hours  Attempted to speak with pt x 2, however, pt unavailable at times of both visits. No family present to provide further history.   No meal completion data available to assess at this time.   Reviewed wt hx; pt has experienced a 7.2% wt loss over the past 3 months, which while not significant for time frame is concerning given advanced age. Wt changes difficult to interpret secondary to CHF.   Pt would greatly benefit from addition of oral nutrition supplements.   Labs reviewed: Na: 125.   Diet Order:   Diet Order            Diet Heart Room service appropriate? Yes; Fluid consistency: Thin; Fluid restriction: 1800 mL Fluid  Diet effective now                 EDUCATION NEEDS:   No education needs have been identified at this time  Skin:  Skin Assessment: Reviewed RN Assessment  Last BM:  10/27/19  Height:   Ht Readings from Last 1 Encounters:  10/27/19 5\' 7"  (1.702 m)    Weight:   Wt Readings from Last 1 Encounters:  10/28/19 69.4 kg    Ideal Body Weight:  61.4  kg  BMI:  Body mass index is 23.96 kg/m.  Estimated Nutritional Needs:   Kcal:  1700-1900  Protein:  85-100 grams  Fluid:  1.8 L    Loistine Chance, RD, LDN, [place] Registered Dietitian II Certified Diabetes Care and Education Specialist Please refer to [facility] for RD and/or RD on-call/weekend/after hours pager

## 2019-10-29 LAB — BASIC METABOLIC PANEL
Anion gap: 11 (ref 5–15)
BUN: 13 mg/dL (ref 8–23)
CO2: 25 mmol/L (ref 22–32)
Calcium: 8.8 mg/dL — ABNORMAL LOW (ref 8.9–10.3)
Chloride: 88 mmol/L — ABNORMAL LOW (ref 98–111)
Creatinine, Ser: 0.96 mg/dL (ref 0.44–1.00)
GFR, Estimated: 54 mL/min — ABNORMAL LOW (ref >60)
Glucose, Bld: 86 mg/dL (ref 70–99)
Potassium: 3.4 mmol/L — ABNORMAL LOW (ref 3.5–5.1)
Sodium: 124 mmol/L — ABNORMAL LOW (ref 135–145)

## 2019-10-29 MED ORDER — POTASSIUM CHLORIDE CRYS ER 20 MEQ PO TBCR
40.0000 meq | EXTENDED_RELEASE_TABLET | Freq: Two times a day (BID) | ORAL | Status: AC
  Administered 2019-10-29 (×2): 40 meq via ORAL
  Filled 2019-10-29 (×2): qty 2

## 2019-10-29 MED ORDER — HALOPERIDOL LACTATE 5 MG/ML IJ SOLN
1.0000 mg | Freq: Four times a day (QID) | INTRAMUSCULAR | Status: DC | PRN
  Administered 2019-10-29 – 2019-10-31 (×2): 1 mg via INTRAVENOUS
  Filled 2019-10-29 (×2): qty 1

## 2019-10-29 MED ORDER — QUETIAPINE FUMARATE 25 MG PO TABS
25.0000 mg | ORAL_TABLET | Freq: Every evening | ORAL | Status: DC | PRN
  Administered 2019-10-29: 25 mg via ORAL
  Filled 2019-10-29: qty 1

## 2019-10-29 NOTE — Progress Notes (Addendum)
[facility] PROGRESS NOTE    Progress Note  Analilia Geddis  ZOX:[id number] DOB: 26-Aug-1932 DOA: 10/26/2019 PCP: Tamsen Roers, MD     Brief Narrative:   Prisma Decarlo is an 86 y.o. female past medical history significant for chronic diastolic heart failure refractory hypertension, chronic atrial fibrillation on Eliquis, hypothyroidism comes in for 1 month of shortness of breath and increased lower extremity swelling.  In the ED chest x-ray was done that showed bilateral pulmonary infiltrates and left-sided pleural effusion was given IV Lasix.  Assessment/Plan:   Acute on chronic diastolic heart failure: With good diuresis on current dose of IV Lasix. He has been weaned to room air, she continues to appear volume overloaded on physical exam Continue Lasix and lisinopril. Her creatinine is improving with diuresis. 2D echo done on 10/28/19 showed an EF of 60%, no wall motion abnormalities, severely dilated left and right atrium, no evidence of mitral stenosis, mild tricuspid regurgitation and moderate aortic stenosis.  Hypervolemic hyponatremia: Back in February 2020 her sodium was 134, sodium has been around 124 125 continue to monitor closely.  Hypokalemia: Replete recheck in the morning.  Left-sided pleural effusion: Likely due to heart failure continue IV diuresis.  Essential hypertension: Blood pressure is improving slowly continue ARB and diuretics.  Chronic atrial fibrillation: Rate controlled on Toprol continue Eliquis.  History of moderate aortic stenosis: She denies any syncope anginal symptoms, 2D echo is pending.  Hypothyroidism: Continue Synthroid.  CKD IIIa: Improving with diuresis.   DVT prophylaxis: Eliquis Family Communication:none Status is: Inpatient  Remains inpatient appropriate because:Hemodynamically unstable   Dispo: The patient is from: Home              Anticipated d/c is to: Home              Anticipated d/c date is: 3 days               Patient currently is not medically stable to d/c.        Code Status:     Code Status Orders  (From admission, onward)         Start     Ordered   10/27/19 1502  Full code  Continuous        10/27/19 1503        Code Status History    This patient has a current code status but no historical code status.   Advance Care Planning Activity        IV Access:    Peripheral IV   Procedures and diagnostic studies:   DG Chest Port 1 View  Result Date: 10/28/2019 CLINICAL DATA:  Congestive heart failure EXAM: PORTABLE CHEST 1 VIEW COMPARISON:  10/26/2019 FINDINGS: Diffuse cardiac enlargement. Small bilateral pleural effusions. Pulmonary vascular congestion with interstitial infiltrates, likely representing edema. Mild progression since prior study. Calcification of the aorta. IMPRESSION: Cardiac enlargement with pulmonary vascular congestion, interstitial edema, and small bilateral pleural effusions. Electronically Signed   By: Lucienne Capers M.D.   On: 10/28/2019 05:52   ECHOCARDIOGRAM COMPLETE  Result Date: 10/28/2019    ECHOCARDIOGRAM REPORT   Patient Name:   DEYANA WNUK Date of Exam: 10/28/2019 Medical Rec #:  [id number]     Height:       67.0 in Accession #:    [id number]    Weight:       153.0 lb Date of Birth:  [birth year]      BSA:  1.805 m Patient Age:    37 years      BP:           120/43 mmHg Patient Gender: F             HR:           60 bpm. Exam Location:  Inpatient Procedure: 2D Echo, Color Doppler and Cardiac Doppler Indications:    CHF-Acute Diastolic 454.09 / W11.91  History:        Patient has prior history of Echocardiogram examinations, most                 recent 06/24/2011. Arrythmias:Atrial Fibrillation; Risk                 Factors:Hypertension and Dyslipidemia.  Sonographer:    Bernadene Person RDCS Referring Phys: [id number] [place]  1. Left ventricular ejection fraction, by estimation, is 60 to 65%. The left ventricle has normal  function. The left ventricle has no regional wall motion abnormalities. Left ventricular diastolic function could not be evaluated.  2. Right ventricular systolic function is low normal. The right ventricular size is normal. There is severely elevated pulmonary artery systolic pressure.  3. Left atrial size was severely dilated.  4. Right atrial size was severely dilated.  5. The mitral valve is normal in structure. Mild mitral valve regurgitation. No evidence of mitral stenosis. Severe mitral annular calcification.  6. Tricuspid valve regurgitation is mild to moderate.  7. The aortic valve is tricuspid. There is moderate calcification of the aortic valve. There is mild thickening of the aortic valve. Aortic valve regurgitation is trivial. Mild to moderate aortic valve stenosis.  8. The inferior vena cava is dilated in size with >50% respiratory variability, suggesting right atrial pressure of 8 mmHg. Comparison(s): No significant change from prior study. Conclusion(s)/Recommendation(s): Otherwise normal echocardiogram, with minor abnormalities described in the report. FINDINGS  Left Ventricle: Left ventricular ejection fraction, by estimation, is 60 to 65%. The left ventricle has normal function. The left ventricle has no regional wall motion abnormalities. The left ventricular internal cavity size was normal in size. There is  no left ventricular hypertrophy. Left ventricular diastolic function could not be evaluated. Right Ventricle: The right ventricular size is normal. No increase in right ventricular wall thickness. Right ventricular systolic function is low normal. There is severely elevated pulmonary artery systolic pressure. The tricuspid regurgitant velocity is 3.65 m/s, and with an assumed right atrial pressure of 8 mmHg, the estimated right ventricular systolic pressure is 21.3 mmHg. Left Atrium: Left atrial size was severely dilated. Right Atrium: Right atrial size was severely dilated. Pericardium:  Trivial pericardial effusion is present. Mitral Valve: The mitral valve is normal in structure. There is mild thickening of the mitral valve leaflet(s). There is mild calcification of the mitral valve leaflet(s). Severe mitral annular calcification. Mild mitral valve regurgitation. No evidence of mitral valve stenosis. Tricuspid Valve: The tricuspid valve is normal in structure. Tricuspid valve regurgitation is mild to moderate. No evidence of tricuspid stenosis. Aortic Valve: The aortic valve is tricuspid. There is moderate calcification of the aortic valve. There is mild thickening of the aortic valve. Aortic valve regurgitation is trivial. Mild to moderate aortic stenosis is present. Aortic valve mean gradient  measures 18.5 mmHg. Aortic valve peak gradient measures 34.0 mmHg. Aortic valve area, by VTI measures 1.26 cm. Pulmonic Valve: The pulmonic valve was not well visualized. Pulmonic valve regurgitation is trivial. No evidence of pulmonic stenosis. Aorta:  The aortic root and ascending aorta are structurally normal, with no evidence of dilitation. Venous: The inferior vena cava is dilated in size with greater than 50% respiratory variability, suggesting right atrial pressure of 8 mmHg. IAS/Shunts: The atrial septum is grossly normal. Additional Comments: There is a small pleural effusion in both left and right lateral regions.  LEFT VENTRICLE PLAX 2D LVIDd:         5.80 cm LVIDs:         4.00 cm LV PW:         0.70 cm LV IVS:        0.70 cm LVOT diam:     1.60 cm LV SV:         90 LV SV Index:   50 LVOT Area:     2.01 cm  RIGHT VENTRICLE TAPSE (M-mode): 1.5 cm LEFT ATRIUM              Index       RIGHT ATRIUM           Index LA diam:        4.60 cm  2.55 cm/m  RA Area:     24.80 cm LA Vol (A2C):   131.0 ml 72.59 ml/m RA Volume:   73.20 ml  40.56 ml/m LA Vol (A4C):   114.0 ml 63.17 ml/m LA Biplane Vol: 128.0 ml 70.93 ml/m  AORTIC VALVE AV Area (Vmax):    1.21 cm AV Area (Vmean):   1.28 cm AV Area  (VTI):     1.26 cm AV Vmax:           291.75 cm/s AV Vmean:          201.250 cm/s AV VTI:            0.714 m AV Peak Grad:      34.0 mmHg AV Mean Grad:      18.5 mmHg LVOT Vmax:         175.00 cm/s LVOT Vmean:        128.500 cm/s LVOT VTI:          0.448 m LVOT/AV VTI ratio: 0.63  AORTA Ao Root diam: 2.60 cm Ao Asc diam:  2.20 cm MITRAL VALVE                TRICUSPID VALVE MV Area (PHT): 5.13 cm     TR Peak grad:   53.3 mmHg MV Decel Time: 148 msec     TR Vmax:        365.00 cm/s MV E velocity: 173.00 cm/s MV A velocity: 58.90 cm/s   SHUNTS MV E/A ratio:  2.94         Systemic VTI:  0.45 m                             Systemic Diam: 1.60 cm Buford Dresser MD Electronically signed by Buford Dresser MD Signature Date/Time: 10/28/2019/2:13:11 PM    Final      Medical Consultants:    None.  Anti-Infectives:   none  Subjective:    [place] orthopnea is improved has been weaned to room air.  Objective:    Vitals:   10/28/19 2007 10/28/19 2340 10/29/19 0500 10/29/19 0732  BP: (!) 165/58 (!) 151/61 (!) 148/79 (!) 143/70  Pulse: (!) 53 60 (!) 58 67  Resp: 20 20  18   Temp: 98.5 F (36.9 C) 98 F (36.7 C)  98.1 F (36.7 C) 98.1 F (36.7 C)  TempSrc: Oral Oral Oral Oral  SpO2: 97% 95% 96% 97%  Weight:   67.6 kg   Height:       SpO2: 97 %   Intake/Output Summary (Last 24 hours) at 10/29/2019 0802 Last data filed at 10/29/2019 0300 Gross per 24 hour  Intake 940 ml  Output 2600 ml  Net -1660 ml   Filed Weights   10/27/19 1359 10/28/19 0021 10/29/19 0500  Weight: 69.6 kg 69.4 kg 67.6 kg    Exam: General exam: In no acute distress. Respiratory system: Good air movement and clear to auscultation. Cardiovascular system: S1 & S2 heard, RRR. +JVD Gastrointestinal system: Abdomen is nondistended, soft and nontender.  Extremities: No pedal edema. Skin: No rashes, lesions or ulcers  Data Reviewed:    Labs: Basic Metabolic Panel: Recent Labs  Lab  10/26/19 1939 10/26/19 1939 10/27/19 1456 10/27/19 1456 10/28/19 0400 10/29/19 0244  NA 124*  --  124*  --  125* 124*  K 4.0   < > 3.7   < > 3.6 3.4*  CL 89*  --  89*  --  91* 88*  CO2 23  --  23  --  25 25  GLUCOSE 115*  --  105*  --  82 86  BUN 16  --  12  --  15 13  CREATININE 0.97  --  0.98  --  1.13* 0.96  CALCIUM 9.6  --  9.0  --  8.6* 8.8*   < > = values in this interval not displayed.   GFR Estimated Creatinine Clearance: 41.7 mL/min (by C-G formula based on SCr of 0.96 mg/dL). Liver Function Tests: No results for input(s): AST, ALT, ALKPHOS, BILITOT, PROT, ALBUMIN in the last 168 hours. No results for input(s): LIPASE, AMYLASE in the last 168 hours. No results for input(s): AMMONIA in the last 168 hours. Coagulation profile No results for input(s): INR, PROTIME in the last 168 hours. COVID-19 Labs  No results for input(s): DDIMER, FERRITIN, LDH, CRP in the last 72 hours.  Lab Results  Component Value Date   [place] NEGATIVE 10/26/2019    CBC: Recent Labs  Lab 10/26/19 1939  WBC 10.9*  NEUTROABS 7.9*  HGB 11.6*  HCT 34.2*  MCV 83.6  PLT 333   Cardiac Enzymes: No results for input(s): CKTOTAL, CKMB, CKMBINDEX, TROPONINI in the last 168 hours. BNP (last 3 results) No results for input(s): PROBNP in the last 8760 hours. CBG: No results for input(s): GLUCAP in the last 168 hours. D-Dimer: No results for input(s): DDIMER in the last 72 hours. Hgb A1c: No results for input(s): HGBA1C in the last 72 hours. Lipid Profile: No results for input(s): CHOL, HDL, LDLCALC, TRIG, CHOLHDL, LDLDIRECT in the last 72 hours. Thyroid function studies: Recent Labs    10/27/19 1456  TSH 0.043*   Anemia work up: No results for input(s): VITAMINB12, FOLATE, FERRITIN, TIBC, IRON, RETICCTPCT in the last 72 hours. Sepsis Labs: Recent Labs  Lab 10/26/19 1939  WBC 10.9*   Microbiology Recent Results (from the past 240 hour(s))  Respiratory Panel by RT PCR (Flu  A&B, Covid) - Nasopharyngeal Swab     Status: None   Collection Time: 10/26/19  9:51 PM   Specimen: Nasopharyngeal Swab  Result Value Ref Range Status   SARS Coronavirus 2 by RT PCR NEGATIVE NEGATIVE Final    Comment: (NOTE) SARS-CoV-2 target nucleic acids are NOT DETECTED.  The SARS-CoV-2 RNA is generally  detectable in upper respiratoy specimens during the acute phase of infection. The lowest concentration of SARS-CoV-2 viral copies this assay can detect is 131 copies/mL. A negative result does not preclude SARS-Cov-2 infection and should not be used as the sole basis for treatment or other patient management decisions. A negative result may occur with  improper specimen collection/handling, submission of specimen other than nasopharyngeal swab, presence of viral mutation(s) within the areas targeted by this assay, and inadequate number of viral copies (<131 copies/mL). A negative result must be combined with clinical observations, patient history, and epidemiological information. The expected result is Negative.  Fact Sheet for Patients:  PinkCheek.be  Fact Sheet for Healthcare Providers:  GravelBags.it  This test is no t yet approved or cleared by the [place] FDA and  has been authorized for detection and/or diagnosis of SARS-CoV-2 by FDA under an Emergency Use Authorization (EUA). This EUA will remain  in effect (meaning this test can be used) for the duration of the COVID-19 declaration under Section 564(b)(1) of the Act, 21 U.S.C. section 360bbb-3(b)(1), unless the authorization is terminated or revoked sooner.     Influenza A by PCR NEGATIVE NEGATIVE Final   Influenza B by PCR NEGATIVE NEGATIVE Final    Comment: (NOTE) The Xpert Xpress SARS-CoV-2/FLU/RSV assay is intended as an aid in  the diagnosis of influenza from Nasopharyngeal swab specimens and  should not be used as a sole basis for treatment. Nasal  washings and  aspirates are unacceptable for Xpert Xpress SARS-CoV-2/FLU/RSV  testing.  Fact Sheet for Patients: PinkCheek.be  Fact Sheet for Healthcare Providers: GravelBags.it  This test is not yet approved or cleared by the [place] FDA and  has been authorized for detection and/or diagnosis of SARS-CoV-2 by  FDA under an Emergency Use Authorization (EUA). This EUA will remain  in effect (meaning this test can be used) for the duration of the  Covid-19 declaration under Section 564(b)(1) of the Act, 21  U.S.C. section 360bbb-3(b)(1), unless the authorization is  terminated or revoked. Performed at [facility], [address] [place], [place] [address]      Medications:   . apixaban  5 mg Oral BID  . escitalopram  10 mg Oral Daily  . feeding supplement  237 mL Oral BID BM  . furosemide  40 mg Intravenous Q12H  . influenza vaccine adjuvanted  0.5 mL Intramuscular Tomorrow-1000  . levothyroxine  200 mcg Oral Q0600  . lisinopril  40 mg Oral Daily  . multivitamin with minerals  1 tablet Oral Daily  . sodium chloride flush  3 mL Intravenous Q12H   Continuous Infusions: . sodium chloride        LOS: 2 days   Charlynne Cousins  [facility]  10/29/2019, 8:02 AM

## 2019-10-29 NOTE — Evaluation (Signed)
Occupational Therapy Evaluation and Discharge Patient Details Name: Erika [name] MRN: [id number] DOB: August 10, 1932 Today's Date: 10/29/2019    History of Present Illness Erika [name] is an 86 y.o. female past medical history significant for chronic diastolic heart failure refractory hypertension, chronic atrial fibrillation on Eliquis, hypothyroidism comes in for 1 month of shortness of breath and increased lower extremity swelling.  In the ED chest x-ray was done that showed bilateral pulmonary infiltrates and left-sided pleural effusion was given IV Lasix. Admitted with acute on chronic CHF.   Clinical Impression   Pt overall functioning at supervision level in ADL and ADL transfers, at or near her baseline. She has a supportive daughter and husband who provide all necessary assist at home. No OT needs.     Follow Up Recommendations  No OT follow up    Equipment Recommendations  None recommended by OT    Recommendations for Other Services       Precautions / Restrictions Precautions Precautions: Fall Restrictions Weight Bearing Restrictions: No      Mobility Bed Mobility               General bed mobility comments: pt in chair    Transfers Overall transfer level: Needs assistance Equipment used: Rolling walker (2 wheeled) Transfers: Sit to/from Stand Sit to Stand: Supervision              Balance Overall balance assessment: Needs assistance Sitting-balance support: Feet supported Sitting balance-Leahy Scale: Good     Standing balance support: Bilateral upper extremity supported Standing balance-Leahy Scale: Poor Standing balance comment: fair static balance                           ADL either performed or assessed with clinical judgement   ADL                                         General ADL Comments: pt is functioning at a supervision level     Vision Patient Visual Report: No change from baseline        Perception     Praxis      Pertinent Vitals/Pain Pain Assessment: No/denies pain     Hand Dominance Right   Extremity/Trunk Assessment Upper Extremity Assessment Upper Extremity Assessment: Overall WFL for tasks assessed   Lower Extremity Assessment Lower Extremity Assessment: Defer to PT evaluation   Cervical / Trunk Assessment Cervical / Trunk Assessment: Kyphotic   Communication Communication Communication: HOH   Cognition Arousal/Alertness: Awake/alert Behavior During Therapy: WFL for tasks assessed/performed Overall Cognitive Status: History of cognitive impairments - at baseline                                     General Comments       Exercises     Shoulder Instructions      Home Living     Available Help at Discharge: Family;Available 24 hours/day Type of Home: House Home Access: Stairs to enter [employer] of Steps: 1   Home Layout: One level     Bathroom Shower/Tub: Walk-in shower         Home Equipment: Environmental consultant - 2 wheels;Shower seat;Grab bars - tub/shower          Prior Functioning/Environment Level of Independence: Independent  with assistive device(s)        Comments: uses RW, sits for part of shower, assisted for IADL        OT Problem List:        OT Treatment/Interventions:      OT Goals(Current goals can be found in the care plan section) Acute Rehab OT Goals Patient Stated Goal: return home  OT Frequency:     Barriers to D/C:            Co-evaluation              AM-PAC OT "6 Clicks" Daily Activity     Outcome Measure Help from another person eating meals?: None Help from another person taking care of personal grooming?: A Little Help from another person toileting, which includes using toliet, bedpan, or urinal?: A Little Help from another person bathing (including washing, rinsing, drying)?: A Little Help from another person to put on and taking off regular upper body  clothing?: None Help from another person to put on and taking off regular lower body clothing?: A Little 6 Click Score: 20   End of Session Equipment Utilized During Treatment: Rolling walker;Gait belt  Activity Tolerance: Patient tolerated treatment well Patient left: in chair;with call bell/phone within reach;with family/visitor present  OT Visit Diagnosis: Other abnormalities of gait and mobility (R26.89);Other symptoms and signs involving cognitive function                Time: 4403-4742 OT Time Calculation (min): 18 min Charges:  OT General Charges $OT Visit: 1 Visit OT Evaluation $OT Eval Moderate Complexity: 1 Mod  Nestor Lewandowsky, OTR/L Acute Rehabilitation Services Pager: [phone] Office: [phone]  Malka So 10/29/2019, 10:13 AM

## 2019-10-29 NOTE — Progress Notes (Signed)
Physical Therapy Treatment Patient Details Name: Erika [name] MRN: [id number] DOB: December 27, 1932 Today's Date: 10/29/2019    History of Present Illness Erika [name] is an 86 y.o. female past medical history significant for chronic diastolic heart failure refractory hypertension, chronic atrial fibrillation on Eliquis, hypothyroidism comes in for 1 month of shortness of breath and increased lower extremity swelling.  In the ED chest x-ray was done that showed bilateral pulmonary infiltrates and left-sided pleural effusion was given IV Lasix. Admitted with acute on chronic CHF.    PT Comments    Pt supine in bed on arrival this session.  Pt tolerated increased gt distance but continues to require close supervision for safety.  HHPT remains appropriate at d/c.     Follow Up Recommendations  Home health PT;Supervision/Assistance - 24 hour     Equipment Recommendations  None recommended by PT    Recommendations for Other Services       Precautions / Restrictions Precautions Precautions: Fall    Mobility  Bed Mobility               General bed mobility comments: pt in chair  Transfers Overall transfer level: Needs assistance Equipment used: Rolling walker (2 wheeled) Transfers: Sit to/from Stand Sit to Stand: Supervision         General transfer comment: for safety  Ambulation/Gait Ambulation/Gait assistance: Supervision Gait Distance (Feet): 200 Feet Assistive device: Rolling walker (2 wheeled) Gait Pattern/deviations: Step-through pattern;Decreased stride length;Shuffle;Decreased dorsiflexion - right;Decreased dorsiflexion - left     General Gait Details: Cues for upper trunk control and scap retraction.  Pt tolerated increased gt distance well.  Pt with mild shuffling, likely baseline.   Stairs             Wheelchair Mobility    Modified Rankin (Stroke Patients Only)       Balance Overall balance assessment: Needs assistance   Sitting  balance-Leahy Scale: Good       Standing balance-Leahy Scale: Fair                              Cognition Arousal/Alertness: Awake/alert Behavior During Therapy: WFL for tasks assessed/performed Overall Cognitive Status: History of cognitive impairments - at baseline                                 General Comments: baseline dementia      Exercises General Exercises - Lower Extremity Ankle Circles/Pumps: AROM;Both;10 reps;Supine Long Arc Quad: AROM;Both;10 reps;Seated Hip ABduction/ADduction: AROM;Both;10 reps;Supine Straight Leg Raises: AROM;Both;10 reps;Supine Hip Flexion/Marching: AROM;Both;10 reps;Seated    General Comments        Pertinent Vitals/Pain Pain Assessment: No/denies pain    Home Living                      Prior Function            PT Goals (current goals can now be found in the care plan section) Acute Rehab PT Goals Patient Stated Goal: return home Potential to Achieve Goals: Good Progress towards PT goals: Progressing toward goals    Frequency    Min 3X/week      PT Plan Current plan remains appropriate    Co-evaluation              AM-PAC PT "6 Clicks" Mobility   Outcome Measure  Help needed turning from  your back to your side while in a flat bed without using bedrails?: None Help needed moving from lying on your back to sitting on the side of a flat bed without using bedrails?: None Help needed moving to and from a bed to a chair (including a wheelchair)?: None Help needed standing up from a chair using your arms (e.g., wheelchair or bedside chair)?: None Help needed to walk in hospital room?: A Little Help needed climbing 3-5 steps with a railing? : A Little 6 Click Score: 22    End of Session Equipment Utilized During Treatment: Gait belt Activity Tolerance: Patient tolerated treatment well Patient left: in chair;with call bell/phone within reach;with chair alarm set Nurse  Communication: Mobility status PT Visit Diagnosis: Unsteadiness on feet (R26.81);Other abnormalities of gait and mobility (R26.89);Difficulty in walking, not elsewhere classified (R26.2)     Time: 1624-4695 PT Time Calculation (min) (ACUTE ONLY): 17 min  Charges:  $Gait Training: 8-22 mins                     Erasmo Leventhal , PTA Acute Rehabilitation Services Pager [phone] Office [phone]     Kathlyn Leachman Eli Hose 10/29/2019, 4:27 PM

## 2019-10-29 NOTE — Plan of Care (Signed)
  Problem: Elimination: Goal: Will not experience complications related to urinary retention Outcome: Progressing   Problem: Safety: Goal: Ability to remain free from injury will improve Outcome: Progressing   

## 2019-10-29 NOTE — Progress Notes (Signed)
Patient repeatedly attempting to get OOB and pulling at cardiac monitor.  States repeatedly that she is "going home" and "getting out of here."  Attempted redirection with little effect.  Notified provider.  Sitting in room with daughter in room at this time.

## 2019-10-29 NOTE — Progress Notes (Signed)
Nutrition Follow-up  DOCUMENTATION CODES:   Not applicable  INTERVENTION:   -Continue MVI with minerals daily -Continue Ensure Enlive po BID, each supplement provides 350 kcal and 20 grams of protein -Magic cup BID with meals, each supplement provides 290 kcal and 9 grams of protein  NUTRITION DIAGNOSIS:   Inadequate oral intake related to decreased appetite as evidenced by per patient/family report.  Ongoing  GOAL:   Patient will meet greater than or equal to 90% of their needs  Progressing   MONITOR:   PO intake, Supplement acceptance, Labs, Weight trends, Skin, I & O's  REASON FOR ASSESSMENT:   Malnutrition Screening Tool    ASSESSMENT:   Erika [name] is a 86 y.o. female with medical history significant of chronic diastolic CHF, refractory HTN, chronic A. fib, hypothyroidism, GERD presented with increasing shortness of breath and leg swelling.  Symptoms started about 1 month ago, gradually getting worse.  Reviewed I/O's: -1.7 L x 24 hours and -1.8 L since admission  UOP: 2.6 L x 24 hours  Spoke with pt and daughter in room. Pt deferred most of the questioning to her daughter. Pt reports her appetite is fair. Per daughter, pt is a very selective eater and eats very little at baseline. She was consuming 3 meals per day, but ate very little (Breakfast: oatmeal or cereal; Lunch: half sandwich; Dinner: a few bites of meat and mashed potatoes). Pt also consumes 1-2 Ensure supplements daily, which she likes.   Pt daughter shares that her appetite has been consistent here compared to home- she consumed a few bites of pancakes for breakfast and ate some peaches and ice cream last night. Pt loves sweets and "would eat large bowl of ice cream if you let her".   Pt and daughter endorse progressive wt loss over the past few years. Per daughter, pt used to weight about 205# and now is down to about 157#. Reviewed wt hx; pt has experienced a 7.2% wt loss over the past 3 months,  which while not significant for time frame is concerning given advanced age. Pt still diuresing, which may be masking further weight loss and fat/ muscle depletions.   Discussed importance of good meal and supplement intake to promote healing. They are amenable to continue Ensure supplements.   Labs reviewed: Na: 124, K: 3.4 (on PO supplementation).   NUTRITION - FOCUSED PHYSICAL EXAM:    Most Recent Value  Orbital Region No depletion  Upper Arm Region Mild depletion  Thoracic and Lumbar Region No depletion  Buccal Region No depletion  Temple Region No depletion  Clavicle Bone Region No depletion  Clavicle and Acromion Bone Region No depletion  Scapular Bone Region No depletion  Dorsal Hand Mild depletion  Patellar Region Mild depletion  Anterior Thigh Region Mild depletion  Posterior Calf Region Mild depletion  Edema (RD Assessment) Mild  Hair Reviewed  Eyes Reviewed  Mouth Reviewed  Skin Reviewed  Nails Reviewed       Diet Order:   Diet Order            Diet Heart Room service appropriate? Yes; Fluid consistency: Thin; Fluid restriction: 1800 mL Fluid  Diet effective now                 EDUCATION NEEDS:   No education needs have been identified at this time  Skin:  Skin Assessment: Reviewed RN Assessment  Last BM:  10/27/19  Height:   Ht Readings from Last 1 Encounters:  10/27/19  5\' 7"  (1.702 m)    Weight:   Wt Readings from Last 1 Encounters:  10/29/19 67.6 kg    Ideal Body Weight:  61.4 kg  BMI:  Body mass index is 23.35 kg/m.  Estimated Nutritional Needs:   Kcal:  1700-1900  Protein:  85-100 grams  Fluid:  1.8 L    Loistine Chance, RD, LDN, [place] Registered Dietitian II Certified Diabetes Care and Education Specialist Please refer to [name] for RD and/or RD on-call/weekend/after hours pager

## 2019-10-29 NOTE — TOC Transition Note (Addendum)
Transition of Care [facility]) - CM/SW Discharge Note   Patient Details  Name: Erika [name] MRN: [id number] Date of Birth: May 28, 1932  [facility] CM/SW Contact:  Erika Mayo, RN Phone Number: 10/29/2019, 4:41 PM   Clinical Narrative:    NCM offered choice to patient and daughter at bedside, daughter states [facility] is ok because they also come to see her Dad.  NCM made referral to Erika [name] with [facility].  She states she is able to take referral.  Soc will begin 24 to 48 hrs post dc. Will need HHPT order. Will need to contact daughter Erika [name] at [phone] to coordinate [facility] services, patient has walker /cane and bsc at home.   Final next level of care: [place] Barriers to Discharge: Continued Medical Work up   Patient Goals and CMS Choice Patient states their goals for this hospitalization and ongoing recovery are:: get better CMS Medicare.gov Compare Post Acute Care list provided to:: Patient Represenative (must comment) Choice offered to / list presented to : Adult Children  Discharge Placement                       Discharge Plan and Services                  DME Agency: NA       HH Arranged: PT HH Agency: [facility] Date [place]: 10/29/19 Time [place]: [phone] Representative spoke with at [place]: Erika [name]  Social Determinants of Health ([place]) Interventions Food Insecurity Interventions: Intervention Not Indicated Transportation Interventions: Intervention Not Indicated   Readmission Risk Interventions No flowsheet data found.

## 2019-10-29 NOTE — Plan of Care (Signed)
  Problem: Activity: Goal: Risk for activity intolerance will decrease Outcome: Progressing   Problem: Coping: Goal: Level of anxiety will decrease Outcome: Progressing   Problem: Elimination: Goal: Will not experience complications related to urinary retention Outcome: Progressing   Problem: Safety: Goal: Ability to remain free from injury will improve Outcome: Progressing   

## 2019-10-29 NOTE — Progress Notes (Signed)
Pt's daughter can stay over night and help the patient since she has dementia.

## 2019-10-30 LAB — BASIC METABOLIC PANEL
Anion gap: 10 (ref 5–15)
BUN: 15 mg/dL (ref 8–23)
CO2: 28 mmol/L (ref 22–32)
Calcium: 8.9 mg/dL (ref 8.9–10.3)
Chloride: 87 mmol/L — ABNORMAL LOW (ref 98–111)
Creatinine, Ser: 1.09 mg/dL — ABNORMAL HIGH (ref 0.44–1.00)
GFR, Estimated: 50 mL/min — ABNORMAL LOW (ref >60)
Glucose, Bld: 82 mg/dL (ref 70–99)
Potassium: 3.9 mmol/L (ref 3.5–5.1)
Sodium: 125 mmol/L — ABNORMAL LOW (ref 135–145)

## 2019-10-30 MED ORDER — FUROSEMIDE 10 MG/ML IJ SOLN
80.0000 mg | Freq: Two times a day (BID) | INTRAMUSCULAR | Status: DC
  Administered 2019-10-30 – 2019-11-01 (×5): 80 mg via INTRAVENOUS
  Filled 2019-10-30 (×5): qty 8

## 2019-10-30 NOTE — Progress Notes (Signed)
[facility] PROGRESS NOTE    Progress Note  Erika [name]  GLO:[id number] DOB: [birth year] DOA: 10/26/2019 PCP: Tamsen Roers, MD     Brief Narrative:   Erika [name] is an 86 y.o. female past medical history significant for chronic diastolic heart failure refractory hypertension, chronic atrial fibrillation on Eliquis, hypothyroidism comes in for 1 month of shortness of breath and increased lower extremity swelling.  In the ED chest x-ray was done that showed bilateral pulmonary infiltrates and left-sided pleural effusion was given IV Lasix.  Assessment/Plan:   Acute on chronic diastolic heart failure: Poor diuresis this morning, she has been weaned to room air. She still appears fluid overloaded on physical exam, continue current regimen. 2D echo done on 10/28/19 showed an EF of 60%, no wall motion abnormalities, severely dilated left and right atrium, no evidence of mitral stenosis, mild tricuspid regurgitation and moderate aortic stenosis.  Hypervolemic hyponatremia: Back in February 2020 her sodium was 134, sodium not improving with IV diuresis. TSH is marginally low, will check a cortisol level in the morning.  Hypokalemia: Replete recheck in the morning.  Left-sided pleural effusion: Likely due to heart failure continue IV diuresis.  Essential hypertension: Blood pressure is improving slowly continue ARB and diuretics.  Chronic atrial fibrillation: Rate controlled on Toprol continue Eliquis.  History of moderate aortic stenosis: She denies any syncope anginal symptoms, 2D echo is pending.  Hypothyroidism: TSH marginally low we will have to repeat it in 6 weeks continue Synthroid.  CKD IIIa: Creatinine continues to be at baseline.   DVT prophylaxis: Eliquis Family Communication:none Status is: Inpatient  Remains inpatient appropriate because:Hemodynamically unstable   Dispo: The patient is from: Home              Anticipated d/c is to: Home               Anticipated d/c date is: 3 days              Patient currently is not medically stable to d/c.        Code Status:     Code Status Orders  (From admission, onward)         Start     Ordered   10/27/19 1502  Full code  Continuous        10/27/19 1503        Code Status History    This patient has a current code status but no historical code status.   Advance Care Planning Activity        IV Access:    Peripheral IV   Procedures and diagnostic studies:   ECHOCARDIOGRAM COMPLETE  Result Date: 10/28/2019    ECHOCARDIOGRAM REPORT   Patient Name:   Erika [name] Date of Exam: 10/28/2019 Medical Rec #:  [id number]     Height:       67.0 in Accession #:    [id number]    Weight:       153.0 lb Date of Birth:  October 05, 1932      BSA:          1.805 m Patient Age:    35 years      BP:           120/43 mmHg Patient Gender: F             HR:           60 bpm. Exam Location:  Inpatient Procedure: 2D Echo, Color Doppler and Cardiac Doppler Indications:  CHF-Acute Diastolic 213.08 / M57.84  History:        Patient has prior history of Echocardiogram examinations, most                 recent 06/24/2011. Arrythmias:Atrial Fibrillation; Risk                 Factors:Hypertension and Dyslipidemia.  Sonographer:    Bernadene Person RDCS Referring Phys: [id number] [place]  1. Left ventricular ejection fraction, by estimation, is 60 to 65%. The left ventricle has normal function. The left ventricle has no regional wall motion abnormalities. Left ventricular diastolic function could not be evaluated.  2. Right ventricular systolic function is low normal. The right ventricular size is normal. There is severely elevated pulmonary artery systolic pressure.  3. Left atrial size was severely dilated.  4. Right atrial size was severely dilated.  5. The mitral valve is normal in structure. Mild mitral valve regurgitation. No evidence of mitral stenosis. Severe mitral annular calcification.  6.  Tricuspid valve regurgitation is mild to moderate.  7. The aortic valve is tricuspid. There is moderate calcification of the aortic valve. There is mild thickening of the aortic valve. Aortic valve regurgitation is trivial. Mild to moderate aortic valve stenosis.  8. The inferior vena cava is dilated in size with >50% respiratory variability, suggesting right atrial pressure of 8 mmHg. Comparison(s): No significant change from prior study. Conclusion(s)/Recommendation(s): Otherwise normal echocardiogram, with minor abnormalities described in the report. FINDINGS  Left Ventricle: Left ventricular ejection fraction, by estimation, is 60 to 65%. The left ventricle has normal function. The left ventricle has no regional wall motion abnormalities. The left ventricular internal cavity size was normal in size. There is  no left ventricular hypertrophy. Left ventricular diastolic function could not be evaluated. Right Ventricle: The right ventricular size is normal. No increase in right ventricular wall thickness. Right ventricular systolic function is low normal. There is severely elevated pulmonary artery systolic pressure. The tricuspid regurgitant velocity is 3.65 m/s, and with an assumed right atrial pressure of 8 mmHg, the estimated right ventricular systolic pressure is 84.1 mmHg. Left Atrium: Left atrial size was severely dilated. Right Atrium: Right atrial size was severely dilated. Pericardium: Trivial pericardial effusion is present. Mitral Valve: The mitral valve is normal in structure. There is mild thickening of the mitral valve leaflet(s). There is mild calcification of the mitral valve leaflet(s). Severe mitral annular calcification. Mild mitral valve regurgitation. No evidence of mitral valve stenosis. Tricuspid Valve: The tricuspid valve is normal in structure. Tricuspid valve regurgitation is mild to moderate. No evidence of tricuspid stenosis. Aortic Valve: The aortic valve is tricuspid. There is  moderate calcification of the aortic valve. There is mild thickening of the aortic valve. Aortic valve regurgitation is trivial. Mild to moderate aortic stenosis is present. Aortic valve mean gradient  measures 18.5 mmHg. Aortic valve peak gradient measures 34.0 mmHg. Aortic valve area, by VTI measures 1.26 cm. Pulmonic Valve: The pulmonic valve was not well visualized. Pulmonic valve regurgitation is trivial. No evidence of pulmonic stenosis. Aorta: The aortic root and ascending aorta are structurally normal, with no evidence of dilitation. Venous: The inferior vena cava is dilated in size with greater than 50% respiratory variability, suggesting right atrial pressure of 8 mmHg. IAS/Shunts: The atrial septum is grossly normal. Additional Comments: There is a small pleural effusion in both left and right lateral regions.  LEFT VENTRICLE PLAX 2D LVIDd:  5.80 cm LVIDs:         4.00 cm LV PW:         0.70 cm LV IVS:        0.70 cm LVOT diam:     1.60 cm LV SV:         90 LV SV Index:   50 LVOT Area:     2.01 cm  RIGHT VENTRICLE TAPSE (M-mode): 1.5 cm LEFT ATRIUM              Index       RIGHT ATRIUM           Index LA diam:        4.60 cm  2.55 cm/m  RA Area:     24.80 cm LA Vol (A2C):   131.0 ml 72.59 ml/m RA Volume:   73.20 ml  40.56 ml/m LA Vol (A4C):   114.0 ml 63.17 ml/m LA Biplane Vol: 128.0 ml 70.93 ml/m  AORTIC VALVE AV Area (Vmax):    1.21 cm AV Area (Vmean):   1.28 cm AV Area (VTI):     1.26 cm AV Vmax:           291.75 cm/s AV Vmean:          201.250 cm/s AV VTI:            0.714 m AV Peak Grad:      34.0 mmHg AV Mean Grad:      18.5 mmHg LVOT Vmax:         175.00 cm/s LVOT Vmean:        128.500 cm/s LVOT VTI:          0.448 m LVOT/AV VTI ratio: 0.63  AORTA Ao Root diam: 2.60 cm Ao Asc diam:  2.20 cm MITRAL VALVE                TRICUSPID VALVE MV Area (PHT): 5.13 cm     TR Peak grad:   53.3 mmHg MV Decel Time: 148 msec     TR Vmax:        365.00 cm/s MV E velocity: 173.00 cm/s MV A  velocity: 58.90 cm/s   SHUNTS MV E/A ratio:  2.94         Systemic VTI:  0.45 m                             Systemic Diam: 1.60 cm Buford Dresser MD Electronically signed by Buford Dresser MD Signature Date/Time: 10/28/2019/2:13:11 PM    Final      Medical Consultants:    None.  Anti-Infectives:   none  Subjective:    Erika [name] orthopnea has resolved she has been weaned to room air she relates her breathing is much better than yesterday.  Objective:    Vitals:   10/29/19 1536 10/29/19 1942 10/30/19 0004 10/30/19 0341  BP: (!) 163/48 (!) 168/57 (!) 154/61 (!) 131/55  Pulse: 62 (!) 56 (!) 53 (!) 51  Resp: 20 18 18 18   Temp: 98 F (36.7 C) 98.1 F (36.7 C) 98.8 F (37.1 C) 98.2 F (36.8 C)  TempSrc: Oral Oral Oral Oral  SpO2: 100% 97% 95% 94%  Weight:    65.7 kg  Height:       SpO2: 94 %   Intake/Output Summary (Last 24 hours) at 10/30/2019 0744 Last data filed at 10/30/2019 0700 Gross per 24 hour  Intake 1443  ml  Output 1000 ml  Net 443 ml   Filed Weights   10/28/19 0021 10/29/19 0500 10/30/19 0341  Weight: 69.4 kg 67.6 kg 65.7 kg    Exam: General exam: In no acute distress. Respiratory system: Good air movement and clear to auscultation. Cardiovascular system: S1 & S2 heard, RRR. + JVD. Gastrointestinal system: Abdomen is nondistended, soft and nontender.  Extremities: No pedal edema. Skin: No rashes, lesions or ulcers Psychiatry: Judgement and insight appear normal. Mood & affect appropriate.  Data Reviewed:    Labs: Basic Metabolic Panel: Recent Labs  Lab 10/26/19 1939 10/26/19 1939 10/27/19 1456 10/27/19 1456 10/28/19 0400 10/28/19 0400 10/29/19 0244 10/30/19 0208  NA 124*  --  124*  --  125*  --  124* 125*  K 4.0   < > 3.7   < > 3.6   < > 3.4* 3.9  CL 89*  --  89*  --  91*  --  88* 87*  CO2 23  --  23  --  25  --  25 28  GLUCOSE 115*  --  105*  --  82  --  86 82  BUN 16  --  12  --  15  --  13 15  CREATININE 0.97   --  0.98  --  1.13*  --  0.96 1.09*  CALCIUM 9.6  --  9.0  --  8.6*  --  8.8* 8.9   < > = values in this interval not displayed.   GFR Estimated Creatinine Clearance: 36.7 mL/min (A) (by C-G formula based on SCr of 1.09 mg/dL (H)). Liver Function Tests: No results for input(s): AST, ALT, ALKPHOS, BILITOT, PROT, ALBUMIN in the last 168 hours. No results for input(s): LIPASE, AMYLASE in the last 168 hours. No results for input(s): AMMONIA in the last 168 hours. Coagulation profile No results for input(s): INR, PROTIME in the last 168 hours. COVID-19 Labs  No results for input(s): DDIMER, FERRITIN, LDH, CRP in the last 72 hours.  Lab Results  Component Value Date   [place] NEGATIVE 10/26/2019    CBC: Recent Labs  Lab 10/26/19 1939  WBC 10.9*  NEUTROABS 7.9*  HGB 11.6*  HCT 34.2*  MCV 83.6  PLT 333   Cardiac Enzymes: No results for input(s): CKTOTAL, CKMB, CKMBINDEX, TROPONINI in the last 168 hours. BNP (last 3 results) No results for input(s): PROBNP in the last 8760 hours. CBG: No results for input(s): GLUCAP in the last 168 hours. D-Dimer: No results for input(s): DDIMER in the last 72 hours. Hgb A1c: No results for input(s): HGBA1C in the last 72 hours. Lipid Profile: No results for input(s): CHOL, HDL, LDLCALC, TRIG, CHOLHDL, LDLDIRECT in the last 72 hours. Thyroid function studies: Recent Labs    10/27/19 1456  TSH 0.043*   Anemia work up: No results for input(s): VITAMINB12, FOLATE, FERRITIN, TIBC, IRON, RETICCTPCT in the last 72 hours. Sepsis Labs: Recent Labs  Lab 10/26/19 1939  WBC 10.9*   Microbiology Recent Results (from the past 240 hour(s))  Respiratory Panel by RT PCR (Flu A&B, Covid) - Nasopharyngeal Swab     Status: None   Collection Time: 10/26/19  9:51 PM   Specimen: Nasopharyngeal Swab  Result Value Ref Range Status   SARS Coronavirus 2 by RT PCR NEGATIVE NEGATIVE Final    Comment: (NOTE) SARS-CoV-2 target nucleic acids are NOT  DETECTED.  The SARS-CoV-2 RNA is generally detectable in upper respiratoy specimens during the acute phase of infection.  The lowest concentration of SARS-CoV-2 viral copies this assay can detect is 131 copies/mL. A negative result does not preclude SARS-Cov-2 infection and should not be used as the sole basis for treatment or other patient management decisions. A negative result may occur with  improper specimen collection/handling, submission of specimen other than nasopharyngeal swab, presence of viral mutation(s) within the areas targeted by this assay, and inadequate number of viral copies (<131 copies/mL). A negative result must be combined with clinical observations, patient history, and epidemiological information. The expected result is Negative.  Fact Sheet for Patients:  PinkCheek.be  Fact Sheet for Healthcare Providers:  GravelBags.it  This test is no t yet approved or cleared by the [place] FDA and  has been authorized for detection and/or diagnosis of SARS-CoV-2 by FDA under an Emergency Use Authorization (EUA). This EUA will remain  in effect (meaning this test can be used) for the duration of the COVID-19 declaration under Section 564(b)(1) of the Act, 21 U.S.C. section 360bbb-3(b)(1), unless the authorization is terminated or revoked sooner.     Influenza A by PCR NEGATIVE NEGATIVE Final   Influenza B by PCR NEGATIVE NEGATIVE Final    Comment: (NOTE) The Xpert Xpress SARS-CoV-2/FLU/RSV assay is intended as an aid in  the diagnosis of influenza from Nasopharyngeal swab specimens and  should not be used as a sole basis for treatment. Nasal washings and  aspirates are unacceptable for Xpert Xpress SARS-CoV-2/FLU/RSV  testing.  Fact Sheet for Patients: PinkCheek.be  Fact Sheet for Healthcare Providers: GravelBags.it  This test is not yet  approved or cleared by the [place] FDA and  has been authorized for detection and/or diagnosis of SARS-CoV-2 by  FDA under an Emergency Use Authorization (EUA). This EUA will remain  in effect (meaning this test can be used) for the duration of the  Covid-19 declaration under Section 564(b)(1) of the Act, 21  U.S.C. section 360bbb-3(b)(1), unless the authorization is  terminated or revoked. Performed at [facility], [address] [place], [place] [address]      Medications:   . apixaban  5 mg Oral BID  . escitalopram  10 mg Oral Daily  . feeding supplement  237 mL Oral BID BM  . furosemide  40 mg Intravenous Q12H  . influenza vaccine adjuvanted  0.5 mL Intramuscular Tomorrow-1000  . levothyroxine  200 mcg Oral Q0600  . lisinopril  40 mg Oral Daily  . multivitamin with minerals  1 tablet Oral Daily  . sodium chloride flush  3 mL Intravenous Q12H   Continuous Infusions: . sodium chloride        LOS: 3 days   Charlynne Cousins  [facility]  10/30/2019, 7:44 AM

## 2019-10-30 NOTE — Progress Notes (Signed)
  Mobility Specialist Criteria Algorithm Info.  Mobility Team: HOB elevated: Activity: Ambulated in hall; Stood at bedside; Transferred:  Bed to chair (to chair after ambulation) Range of motion: Active;All extremities Level of assistance: Standby assist, set-up cues, supervision of patient - no hands on (Min A sit/stand; Min G ambulating ) Assistive device: Front wheel walker Minutes sitting in chair:  Minutes stood: 5 minutes Minutes ambulated: 5 minutes Distance ambulated (ft): 120 ft (60 + 60) Mobility response: Tolerated well Bed Position: Chair ([place] chair)  Received pt lying supine in bed with daughter at the bedside, eager to participate in mobility this morning. Patient got to the EOB independently and required minimal HHA sit/stand with RW. C/o LE's feeling "weird" and was afraid to take steps initially. Pt stood at the bedside for approximately 1 minute to gather bearings/balance before ambulating in hall. At first she only wanted to ambulate to the door of the room but extended into the hallway after establishing balance. Ambulated in hall 149ft with steady gait and RW, requiring one standing rest break. Pt tolerated well without any complaints and is now sitting in recliner chair with all needs met.   10/30/2019 2:28 PM

## 2019-10-31 LAB — BASIC METABOLIC PANEL
Anion gap: 10 (ref 5–15)
BUN: 19 mg/dL (ref 8–23)
CO2: 29 mmol/L (ref 22–32)
Calcium: 9.1 mg/dL (ref 8.9–10.3)
Chloride: 87 mmol/L — ABNORMAL LOW (ref 98–111)
Creatinine, Ser: 1.24 mg/dL — ABNORMAL HIGH (ref 0.44–1.00)
GFR, Estimated: 43 mL/min — ABNORMAL LOW (ref >60)
Glucose, Bld: 86 mg/dL (ref 70–99)
Potassium: 3.5 mmol/L (ref 3.5–5.1)
Sodium: 126 mmol/L — ABNORMAL LOW (ref 135–145)

## 2019-10-31 LAB — CORTISOL: Cortisol, Plasma: 16.2 ug/dL

## 2019-10-31 MED ORDER — POTASSIUM CHLORIDE CRYS ER 20 MEQ PO TBCR
20.0000 meq | EXTENDED_RELEASE_TABLET | Freq: Two times a day (BID) | ORAL | Status: DC
  Administered 2019-10-31 – 2019-11-01 (×3): 20 meq via ORAL
  Filled 2019-10-31 (×3): qty 1

## 2019-10-31 MED ORDER — LOPERAMIDE HCL 2 MG PO CAPS
2.0000 mg | ORAL_CAPSULE | ORAL | Status: DC | PRN
  Administered 2019-10-31: 2 mg via ORAL
  Filled 2019-10-31: qty 1

## 2019-10-31 MED ORDER — LISINOPRIL 20 MG PO TABS
20.0000 mg | ORAL_TABLET | Freq: Every day | ORAL | Status: DC
  Administered 2019-10-31 – 2019-11-01 (×2): 20 mg via ORAL
  Filled 2019-10-31 (×2): qty 1

## 2019-10-31 NOTE — Progress Notes (Signed)
[facility] PROGRESS NOTE    Progress Note  Erika [name]  ZTI:[id number] DOB: October 19, 1932 DOA: 10/26/2019 PCP: Tamsen Roers, MD     Brief Narrative:   Erika [name] is an 86 y.o. female past medical history significant for chronic diastolic heart failure refractory hypertension, chronic atrial fibrillation on Eliquis, hypothyroidism comes in for 1 month of shortness of breath and increased lower extremity swelling.  In the ED chest x-ray was done that showed bilateral pulmonary infiltrates and left-sided pleural effusion was given IV Lasix.  Assessment/Plan:   Acute on chronic diastolic heart failure: Good diuresis over the last 24 hours. She still appears fluid overloaded on physical exam does been mild bump in her creatinine we will continue current dose of IV Lasix he is about 2.5 L negative  Hypervolemic hyponatremia: Back in February 2020 her sodium was 134. Her sodium this morning has slowly started to improve is 126 recheck a basic metabolic panel in the morning. TSH is 0.4, cortisol level 16, which is a good response.  Hypokalemia: Potassium 3.5 this morning with IV diuresis replete orally.  Left-sided pleural effusion: Likely due to heart failure continue IV diuresis.  Essential hypertension: Blood pressure is significantly improved with diuresis, decrease her lisinopril.  Continue Lasix IV 80 mg twice daily  Chronic atrial fibrillation: Rate controlled on Toprol continue Eliquis.  History of moderate aortic stenosis: She denies any syncope anginal symptoms, 2D echo is pending.  Hypothyroidism: TSH marginally low we will have to repeat it in 6 weeks continue Synthroid.  CKD IIIa: Continues to remain close to baseline.  Recheck a basic metabolic panel in the morning.   DVT prophylaxis: Eliquis Family Communication:none Status is: Inpatient  Remains inpatient appropriate because:Hemodynamically unstable   Dispo: The patient is from: Home               Anticipated d/c is to: Home              Anticipated d/c date is: 1 days              Patient currently is not medically stable to d/c.        Code Status:     Code Status Orders  (From admission, onward)         Start     Ordered   10/27/19 1502  Full code  Continuous        10/27/19 1503        Code Status History    This patient has a current code status but no historical code status.   Advance Care Planning Activity        IV Access:    Peripheral IV   Procedures and diagnostic studies:   No results found.   Medical Consultants:    None.  Anti-Infectives:   none  Subjective:    Erika [name] relates her breathing is better she wants to go home.  Objective:    Vitals:   10/30/19 1211 10/30/19 1926 10/31/19 0251 10/31/19 0409  BP: (!) 174/64 (!) 153/45  (!) 105/48  Pulse: (!) 59 (!) 54  (!) 48  Resp: 17 18  18   Temp: 98 F (36.7 C) 97.7 F (36.5 C)  (!) 97.5 F (36.4 C)  TempSrc:  Oral  Oral  SpO2: 99% 96%  96%  Weight:   63.9 kg   Height:       SpO2: 96 %   Intake/Output Summary (Last 24 hours) at 10/31/2019 8250 Last data filed  at 10/31/2019 0836 Gross per 24 hour  Intake 1083 ml  Output 2350 ml  Net -1267 ml   Filed Weights   10/29/19 0500 10/30/19 0341 10/31/19 0251  Weight: 67.6 kg 65.7 kg 63.9 kg    Exam: General exam: In no acute distress. Respiratory system: Lungs are clear good air movement.. Cardiovascular system: S1 & S2 heard, RRR.  Positive JVD Gastrointestinal system: Abdomen is nondistended, soft and nontender.  Extremities: No pedal edema. Skin: No rashes, lesions or ulcers  Data Reviewed:    Labs: Basic Metabolic Panel: Recent Labs  Lab 10/27/19 1456 10/27/19 1456 10/28/19 0400 10/28/19 0400 10/29/19 0244 10/29/19 0244 10/30/19 0208 10/31/19 0330  NA 124*  --  125*  --  124*  --  125* 126*  K 3.7   < > 3.6   < > 3.4*   < > 3.9 3.5  CL 89*  --  91*  --  88*  --  87* 87*  CO2 23  --   25  --  25  --  28 29  GLUCOSE 105*  --  82  --  86  --  82 86  BUN 12  --  15  --  13  --  15 19  CREATININE 0.98  --  1.13*  --  0.96  --  1.09* 1.24*  CALCIUM 9.0  --  8.6*  --  8.8*  --  8.9 9.1   < > = values in this interval not displayed.   GFR Estimated Creatinine Clearance: 32.3 mL/min (A) (by C-G formula based on SCr of 1.24 mg/dL (H)). Liver Function Tests: No results for input(s): AST, ALT, ALKPHOS, BILITOT, PROT, ALBUMIN in the last 168 hours. No results for input(s): LIPASE, AMYLASE in the last 168 hours. No results for input(s): AMMONIA in the last 168 hours. Coagulation profile No results for input(s): INR, PROTIME in the last 168 hours. COVID-19 Labs  No results for input(s): DDIMER, FERRITIN, LDH, CRP in the last 72 hours.  Lab Results  Component Value Date   [place] NEGATIVE 10/26/2019    CBC: Recent Labs  Lab 10/26/19 1939  WBC 10.9*  NEUTROABS 7.9*  HGB 11.6*  HCT 34.2*  MCV 83.6  PLT 333   Cardiac Enzymes: No results for input(s): CKTOTAL, CKMB, CKMBINDEX, TROPONINI in the last 168 hours. BNP (last 3 results) No results for input(s): PROBNP in the last 8760 hours. CBG: No results for input(s): GLUCAP in the last 168 hours. D-Dimer: No results for input(s): DDIMER in the last 72 hours. Hgb A1c: No results for input(s): HGBA1C in the last 72 hours. Lipid Profile: No results for input(s): CHOL, HDL, LDLCALC, TRIG, CHOLHDL, LDLDIRECT in the last 72 hours. Thyroid function studies: No results for input(s): TSH, T4TOTAL, T3FREE, THYROIDAB in the last 72 hours.  Invalid input(s): FREET3 Anemia work up: No results for input(s): VITAMINB12, FOLATE, FERRITIN, TIBC, IRON, RETICCTPCT in the last 72 hours. Sepsis Labs: Recent Labs  Lab 10/26/19 1939  WBC 10.9*   Microbiology Recent Results (from the past 240 hour(s))  Respiratory Panel by RT PCR (Flu A&B, Covid) - Nasopharyngeal Swab     Status: None   Collection Time: 10/26/19  9:51 PM    Specimen: Nasopharyngeal Swab  Result Value Ref Range Status   SARS Coronavirus 2 by RT PCR NEGATIVE NEGATIVE Final    Comment: (NOTE) SARS-CoV-2 target nucleic acids are NOT DETECTED.  The SARS-CoV-2 RNA is generally detectable in upper respiratoy specimens during  the acute phase of infection. The lowest concentration of SARS-CoV-2 viral copies this assay can detect is 131 copies/mL. A negative result does not preclude SARS-Cov-2 infection and should not be used as the sole basis for treatment or other patient management decisions. A negative result may occur with  improper specimen collection/handling, submission of specimen other than nasopharyngeal swab, presence of viral mutation(s) within the areas targeted by this assay, and inadequate number of viral copies (<131 copies/mL). A negative result must be combined with clinical observations, patient history, and epidemiological information. The expected result is Negative.  Fact Sheet for Patients:  PinkCheek.be  Fact Sheet for Healthcare Providers:  GravelBags.it  This test is no t yet approved or cleared by the [place] FDA and  has been authorized for detection and/or diagnosis of SARS-CoV-2 by FDA under an Emergency Use Authorization (EUA). This EUA will remain  in effect (meaning this test can be used) for the duration of the COVID-19 declaration under Section 564(b)(1) of the Act, 21 U.S.C. section 360bbb-3(b)(1), unless the authorization is terminated or revoked sooner.     Influenza A by PCR NEGATIVE NEGATIVE Final   Influenza B by PCR NEGATIVE NEGATIVE Final    Comment: (NOTE) The Xpert Xpress SARS-CoV-2/FLU/RSV assay is intended as an aid in  the diagnosis of influenza from Nasopharyngeal swab specimens and  should not be used as a sole basis for treatment. Nasal washings and  aspirates are unacceptable for Xpert Xpress SARS-CoV-2/FLU/RSV   testing.  Fact Sheet for Patients: PinkCheek.be  Fact Sheet for Healthcare Providers: GravelBags.it  This test is not yet approved or cleared by the [place] FDA and  has been authorized for detection and/or diagnosis of SARS-CoV-2 by  FDA under an Emergency Use Authorization (EUA). This EUA will remain  in effect (meaning this test can be used) for the duration of the  Covid-19 declaration under Section 564(b)(1) of the Act, 21  U.S.C. section 360bbb-3(b)(1), unless the authorization is  terminated or revoked. Performed at [facility], [address] [place], [place] [address]      Medications:    apixaban  5 mg Oral BID   escitalopram  10 mg Oral Daily   feeding supplement  237 mL Oral BID BM   furosemide  80 mg Intravenous Q12H   influenza vaccine adjuvanted  0.5 mL Intramuscular Tomorrow-1000   levothyroxine  200 mcg Oral Q0600   lisinopril  40 mg Oral Daily   multivitamin with minerals  1 tablet Oral Daily   sodium chloride flush  3 mL Intravenous Q12H   Continuous Infusions:  sodium chloride        LOS: 4 days   Charlynne Cousins  [facility]  10/31/2019, 8:50 AM

## 2019-11-01 LAB — BASIC METABOLIC PANEL
Anion gap: 13 (ref 5–15)
BUN: 34 mg/dL — ABNORMAL HIGH (ref 8–23)
CO2: 26 mmol/L (ref 22–32)
Calcium: 9.1 mg/dL (ref 8.9–10.3)
Chloride: 87 mmol/L — ABNORMAL LOW (ref 98–111)
Creatinine, Ser: 1.54 mg/dL — ABNORMAL HIGH (ref 0.44–1.00)
GFR, Estimated: 33 mL/min — ABNORMAL LOW (ref >60)
Glucose, Bld: 97 mg/dL (ref 70–99)
Potassium: 3.9 mmol/L (ref 3.5–5.1)
Sodium: 126 mmol/L — ABNORMAL LOW (ref 135–145)

## 2019-11-01 MED ORDER — AMLODIPINE BESYLATE 10 MG PO TABS: 10.0000 mg | ORAL_TABLET | Freq: Every day | ORAL | 3 refills | Status: DC

## 2019-11-01 MED ORDER — LISINOPRIL 40 MG PO TABS
20.0000 mg | ORAL_TABLET | Freq: Every day | ORAL | 2 refills | Status: DC
Start: 2019-11-03 — End: 2021-09-14

## 2019-11-01 MED ORDER — HYDRALAZINE HCL 50 MG PO TABS: 50.0000 mg | ORAL_TABLET | Freq: Two times a day (BID) | ORAL | 3 refills | Status: DC

## 2019-11-01 MED ORDER — FUROSEMIDE 20 MG PO TABS
10.0000 mg | ORAL_TABLET | Freq: Every morning | ORAL | Status: DC
Start: 2019-11-03 — End: 2021-02-20

## 2019-11-01 MED ORDER — LISINOPRIL 40 MG PO TABS
20.0000 mg | ORAL_TABLET | Freq: Every day | ORAL | 2 refills | Status: DC
Start: 2019-11-01 — End: 2019-11-01

## 2019-11-01 NOTE — Discharge Summary (Signed)
Physician Discharge Summary  Erika [name] PJK:[id number] DOB: [birth year] DOA: 10/26/2019  PCP: Tamsen Roers, MD  Admit date: 10/26/2019 Discharge date: 11/01/2019  Admitted From: Home Disposition:  Home  Recommendations for Outpatient Follow-up:  1. Follow up with PCP in 1-2 weeks, her Lexapro was discontinued as this worsening her hyponatremia this was stopped and she will have to be resume another agent as an outpatient. 2. Please obtain BMP/CBC in one week 3. Recheck a free T3-4 and a TSH in 6 weeks   Home Health:No Equipment/Devices:None  Discharge Condition:Stable CODE STATUS: Full Diet recommendation: Heart Healthy   Brief/Interim Summary:  86 y.o. female past medical history significant for chronic diastolic heart failure refractory hypertension, chronic atrial fibrillation on Eliquis, hypothyroidism comes in for 1 month of shortness of breath and increased lower extremity swelling.  In the ED chest x-ray was done that showed bilateral pulmonary infiltrates and left-sided pleural effusion was given IV Lasix  Discharge Diagnoses:  Active Problems:   Essential hypertension   Acute on chronic diastolic CHF (congestive heart failure) (HCC)   Pleural effusion due to CHF (congestive heart failure) (HCC)   Hyponatremia  Acute on chronic diastolic heart failure: She was started on IV Lasix and she diuresed about 4 L. She was changed to her home dose of Lasix 20 mg p.o. daily which should continue as an outpatient.  Hyponatremia multifactorial in the setting of hypervolemia and SSRI (Lexapro): Back in February 2020 her sodium was 134, on admission it was 123 she started diuresis and improved to 126. Lexapro was discontinued and she will have to be started on another medication as an outpatient.  Hypokalemia: Likely due to diuresis was repleted orally now resolved.  Left-sided pleural effusion: Likely due to heart failure resolved with diuresis.  Essential  hypertension: Blood pressure significantly improved.  She will continue her Lasix Norvasc and hydralazine as an outpatient.  Her dose of lisinopril was decreased.  Chronic atrial fibrillation: Continue Toprol and Eliquis.  History of moderate aortic stenosis: Currently asymptomatic follow-up with PCP as an outpatient.  Hypothyroidism: TSH is marginally low we will have to repeated in 6 to 4 weeks with a free T4.  Chronic kidney disease stage IIIa: Mild increase in creatinine likely due to overdiuresis Lasix will be held for 2 days she will be submitted her home as an outpatient   Discharge Instructions  Discharge Instructions    Diet - low sodium heart healthy   Complete by: As directed    Increase activity slowly   Complete by: As directed      Allergies as of 11/01/2019      Reactions   Celecoxib Hives, Itching, Other (See Comments)   Face only affected   Amiodarone Other (See Comments)   Ineffective   Dronedarone Hydrochloride Swelling, Other (See Comments)   Multaq (dronedarone)- caused edema   Ezetimibe Other (See Comments)   Fatigue   Flecainide Acetate Other (See Comments)   Fatigue   Lisinopril Other (See Comments), Cough   Causes coughing   Namenda [memantine Hcl] Other (See Comments)   Slept a lot, felt terrible   Sulfonamide Derivatives Rash, Other (See Comments)   "Mouth breaks out"      Medication List    STOP taking these medications   escitalopram 10 MG tablet Commonly known as: LEXAPRO     TAKE these medications   amLODipine 10 MG tablet Commonly known as: NORVASC Take 1 tablet (10 mg total) by mouth daily. Start taking on: November 03, 2019 What changed: These instructions start on November 03, 2019. If you are unsure what to do until then, ask your doctor or other care provider.   desloratadine 5 MG tablet Commonly known as: CLARINEX Take 5 mg by mouth in the morning.   Eliquis 5 MG Tabs tablet Generic drug: apixaban TAKE 1 TABLET BY  MOUTH TWICE A DAY What changed: how much to take   ergocalciferol 1.25 MG (50000 UT) capsule Commonly known as: VITAMIN D2 Take 50,000 Units by mouth every Thursday.   furosemide 20 MG tablet Commonly known as: LASIX Take 0.5 tablets (10 mg total) by mouth in the morning. Start taking on: November 03, 2019 What changed: These instructions start on November 03, 2019. If you are unsure what to do until then, ask your doctor or other care provider.   hydrALAZINE 50 MG tablet Commonly known as: APRESOLINE Take 1 tablet (50 mg total) by mouth in the morning and at bedtime. Start taking on: November 02, 2019   HYDROcodone-acetaminophen 10-325 MG tablet Commonly known as: NORCO Take 0.5-1 tablets by mouth 2 (two) times daily as needed (for pain). What changed: Another medication with the same name was removed. Continue taking this medication, and follow the directions you see here.   lisinopril 40 MG tablet Commonly known as: ZESTRIL Take 0.5 tablets (20 mg total) by mouth daily. What changed: how much to take   omega-3 acid ethyl esters 1 g capsule Commonly known as: LOVAZA Take 1 g by mouth in the morning.   omeprazole 20 MG capsule Commonly known as: PRILOSEC Take 20 mg by mouth at bedtime.   Red Yeast Rice Extract 600 MG Caps Take 1,200 mg by mouth daily with breakfast.   Synthroid 200 MCG tablet Generic drug: levothyroxine Take 200 mcg by mouth daily before breakfast.       Follow-up Information    [facility].   Specialty: Home Health Services Why: HHPT Contact information: [place] [address] [phone]              Allergies  Allergen Reactions  . Celecoxib Hives, Itching and Other (See Comments)    Face only affected  . Amiodarone Other (See Comments)    Ineffective   . Dronedarone Hydrochloride Swelling and Other (See Comments)    Multaq (dronedarone)- caused edema   . Ezetimibe Other (See Comments)     Fatigue   . Flecainide Acetate Other (See Comments)    Fatigue   . Lisinopril Other (See Comments) and Cough    Causes coughing  . Namenda [Memantine Hcl] Other (See Comments)    Slept a lot, felt terrible  . Sulfonamide Derivatives Rash and Other (See Comments)    "Mouth breaks out"    Consultations:  None   Procedures/Studies: DG Chest Port 1 View  Result Date: 10/28/2019 CLINICAL DATA:  Congestive heart failure EXAM: PORTABLE CHEST 1 VIEW COMPARISON:  10/26/2019 FINDINGS: Diffuse cardiac enlargement. Small bilateral pleural effusions. Pulmonary vascular congestion with interstitial infiltrates, likely representing edema. Mild progression since prior study. Calcification of the aorta. IMPRESSION: Cardiac enlargement with pulmonary vascular congestion, interstitial edema, and small bilateral pleural effusions. Electronically Signed   By: Lucienne Capers M.D.   On: 10/28/2019 05:52   DG Chest Port 1 View  Result Date: 10/26/2019 CLINICAL DATA:  Cough.  Shortness of breath with exertion. EXAM: PORTABLE CHEST 1 VIEW COMPARISON:  01/01/2012 FINDINGS: The cardiac silhouette remains enlarged. There is a new  small to moderate-sized left pleural effusion with associated left basilar atelectasis, and there may be a trace right pleural effusion. There is central pulmonary vascular congestion without overt edema. No pneumothorax is identified. Thoracic dextroscoliosis is noted. IMPRESSION: 1. New left pleural effusion and left basilar atelectasis. 2. Cardiomegaly and pulmonary vascular congestion. Electronically Signed   By: Logan Bores M.D.   On: 10/26/2019 16:34   ECHOCARDIOGRAM COMPLETE  Result Date: 10/28/2019    ECHOCARDIOGRAM REPORT   Patient Name:   PATRIECE ARCHBOLD Date of Exam: 10/28/2019 Medical Rec #:  [id number]     Height:       67.0 in Accession #:    [id number]    Weight:       153.0 lb Date of Birth:  06-27-1932      BSA:          1.805 m Patient Age:    32 years      BP:            120/43 mmHg Patient Gender: F             HR:           60 bpm. Exam Location:  Inpatient Procedure: 2D Echo, Color Doppler and Cardiac Doppler Indications:    CHF-Acute Diastolic 989.21 / J94.17  History:        Patient has prior history of Echocardiogram examinations, most                 recent 06/24/2011. Arrythmias:Atrial Fibrillation; Risk                 Factors:Hypertension and Dyslipidemia.  Sonographer:    Bernadene Person RDCS Referring Phys: [id number] Datto  1. Left ventricular ejection fraction, by estimation, is 60 to 65%. The left ventricle has normal function. The left ventricle has no regional wall motion abnormalities. Left ventricular diastolic function could not be evaluated.  2. Right ventricular systolic function is low normal. The right ventricular size is normal. There is severely elevated pulmonary artery systolic pressure.  3. Left atrial size was severely dilated.  4. Right atrial size was severely dilated.  5. The mitral valve is normal in structure. Mild mitral valve regurgitation. No evidence of mitral stenosis. Severe mitral annular calcification.  6. Tricuspid valve regurgitation is mild to moderate.  7. The aortic valve is tricuspid. There is moderate calcification of the aortic valve. There is mild thickening of the aortic valve. Aortic valve regurgitation is trivial. Mild to moderate aortic valve stenosis.  8. The inferior vena cava is dilated in size with >50% respiratory variability, suggesting right atrial pressure of 8 mmHg. Comparison(s): No significant change from prior study. Conclusion(s)/Recommendation(s): Otherwise normal echocardiogram, with minor abnormalities described in the report. FINDINGS  Left Ventricle: Left ventricular ejection fraction, by estimation, is 60 to 65%. The left ventricle has normal function. The left ventricle has no regional wall motion abnormalities. The left ventricular internal cavity size was normal in size. There is  no left  ventricular hypertrophy. Left ventricular diastolic function could not be evaluated. Right Ventricle: The right ventricular size is normal. No increase in right ventricular wall thickness. Right ventricular systolic function is low normal. There is severely elevated pulmonary artery systolic pressure. The tricuspid regurgitant velocity is 3.65 m/s, and with an assumed right atrial pressure of 8 mmHg, the estimated right ventricular systolic pressure is 18.5 mmHg. Left Atrium: Left atrial size was severely dilated. Right Atrium: Right atrial size was  severely dilated. Pericardium: Trivial pericardial effusion is present. Mitral Valve: The mitral valve is normal in structure. There is mild thickening of the mitral valve leaflet(s). There is mild calcification of the mitral valve leaflet(s). Severe mitral annular calcification. Mild mitral valve regurgitation. No evidence of mitral valve stenosis. Tricuspid Valve: The tricuspid valve is normal in structure. Tricuspid valve regurgitation is mild to moderate. No evidence of tricuspid stenosis. Aortic Valve: The aortic valve is tricuspid. There is moderate calcification of the aortic valve. There is mild thickening of the aortic valve. Aortic valve regurgitation is trivial. Mild to moderate aortic stenosis is present. Aortic valve mean gradient  measures 18.5 mmHg. Aortic valve peak gradient measures 34.0 mmHg. Aortic valve area, by VTI measures 1.26 cm. Pulmonic Valve: The pulmonic valve was not well visualized. Pulmonic valve regurgitation is trivial. No evidence of pulmonic stenosis. Aorta: The aortic root and ascending aorta are structurally normal, with no evidence of dilitation. Venous: The inferior vena cava is dilated in size with greater than 50% respiratory variability, suggesting right atrial pressure of 8 mmHg. IAS/Shunts: The atrial septum is grossly normal. Additional Comments: There is a small pleural effusion in both left and right lateral regions.  LEFT  VENTRICLE PLAX 2D LVIDd:         5.80 cm LVIDs:         4.00 cm LV PW:         0.70 cm LV IVS:        0.70 cm LVOT diam:     1.60 cm LV SV:         90 LV SV Index:   50 LVOT Area:     2.01 cm  RIGHT VENTRICLE TAPSE (M-mode): 1.5 cm LEFT ATRIUM              Index       RIGHT ATRIUM           Index LA diam:        4.60 cm  2.55 cm/m  RA Area:     24.80 cm LA Vol (A2C):   131.0 ml 72.59 ml/m RA Volume:   73.20 ml  40.56 ml/m LA Vol (A4C):   114.0 ml 63.17 ml/m LA Biplane Vol: 128.0 ml 70.93 ml/m  AORTIC VALVE AV Area (Vmax):    1.21 cm AV Area (Vmean):   1.28 cm AV Area (VTI):     1.26 cm AV Vmax:           291.75 cm/s AV Vmean:          201.250 cm/s AV VTI:            0.714 m AV Peak Grad:      34.0 mmHg AV Mean Grad:      18.5 mmHg LVOT Vmax:         175.00 cm/s LVOT Vmean:        128.500 cm/s LVOT VTI:          0.448 m LVOT/AV VTI ratio: 0.63  AORTA Ao Root diam: 2.60 cm Ao Asc diam:  2.20 cm MITRAL VALVE                TRICUSPID VALVE MV Area (PHT): 5.13 cm     TR Peak grad:   53.3 mmHg MV Decel Time: 148 msec     TR Vmax:        365.00 cm/s MV E velocity: 173.00 cm/s MV A velocity: 58.90 cm/s   SHUNTS MV  E/A ratio:  2.94         Systemic VTI:  0.45 m                             Systemic Diam: 1.60 cm Buford Dresser MD Electronically signed by Buford Dresser MD Signature Date/Time: 10/28/2019/2:13:11 PM    Final    DG HIP UNILAT WITH PELVIS 2-3 VIEWS RIGHT  Result Date: 10/02/2019 CLINICAL DATA:  Right hip pain after a fall EXAM: DG HIP (WITH OR WITHOUT PELVIS) 2-3V RIGHT COMPARISON:  None. FINDINGS: Degenerative changes in the lower lumbar spine and right hip. No evidence of acute fracture or dislocation. No focal bone lesion or bone destruction. SI joints and symphysis pubis are nondisplaced. Vascular calcifications. Soft tissues are unremarkable. IMPRESSION: Degenerative changes in the lower lumbar spine and right hip. No acute bony abnormalities. Electronically Signed   By: Lucienne Capers M.D.   On: 10/02/2019 22:43    (Echo, Carotid, EGD, Colonoscopy, ERCP)    Subjective: No new complaints feels great.  Discharge Exam: Vitals:   10/31/19 1929 11/01/19 0621  BP: (!) 148/54 138/85  Pulse: (!) 54 (!) 50  Resp: 18   Temp: 98.1 F (36.7 C) 99.2 F (37.3 C)  SpO2: 93% 96%   Vitals:   10/31/19 0409 10/31/19 1929 11/01/19 0100 11/01/19 0621  BP: (!) 105/48 (!) 148/54  138/85  Pulse: (!) 48 (!) 54  (!) 50  Resp: 18 18    Temp: (!) 97.5 F (36.4 C) 98.1 F (36.7 C)  99.2 F (37.3 C)  TempSrc: Oral Oral  Oral  SpO2: 96% 93%  96%  Weight:   63.9 kg   Height:        General: Pt is alert, awake, not in acute distress Cardiovascular: RRR, S1/S2 +, no rubs, no gallops Respiratory: CTA bilaterally, no wheezing, no rhonchi Abdominal: Soft, NT, ND, bowel sounds + Extremities: no edema, no cyanosis    The results of significant diagnostics from this hospitalization (including imaging, microbiology, ancillary and laboratory) are listed below for reference.     Microbiology: Recent Results (from the past 240 hour(s))  Respiratory Panel by RT PCR (Flu A&B, Covid) - Nasopharyngeal Swab     Status: None   Collection Time: 10/26/19  9:51 PM   Specimen: Nasopharyngeal Swab  Result Value Ref Range Status   SARS Coronavirus 2 by RT PCR NEGATIVE NEGATIVE Final    Comment: (NOTE) SARS-CoV-2 target nucleic acids are NOT DETECTED.  The SARS-CoV-2 RNA is generally detectable in upper respiratoy specimens during the acute phase of infection. The lowest concentration of SARS-CoV-2 viral copies this assay can detect is 131 copies/mL. A negative result does not preclude SARS-Cov-2 infection and should not be used as the sole basis for treatment or other patient management decisions. A negative result may occur with  improper specimen collection/handling, submission of specimen other than nasopharyngeal swab, presence of viral mutation(s) within the areas targeted by  this assay, and inadequate number of viral copies (<131 copies/mL). A negative result must be combined with clinical observations, patient history, and epidemiological information. The expected result is Negative.  Fact Sheet for Patients:  PinkCheek.be  Fact Sheet for Healthcare Providers:  GravelBags.it  This test is no t yet approved or cleared by the [place] FDA and  has been authorized for detection and/or diagnosis of SARS-CoV-2 by FDA under an Emergency Use Authorization (EUA). This EUA will remain  in effect (meaning this test can be used) for the duration of the COVID-19 declaration under Section 564(b)(1) of the Act, 21 U.S.C. section 360bbb-3(b)(1), unless the authorization is terminated or revoked sooner.     Influenza A by PCR NEGATIVE NEGATIVE Final   Influenza B by PCR NEGATIVE NEGATIVE Final    Comment: (NOTE) The Xpert Xpress SARS-CoV-2/FLU/RSV assay is intended as an aid in  the diagnosis of influenza from Nasopharyngeal swab specimens and  should not be used as a sole basis for treatment. Nasal washings and  aspirates are unacceptable for Xpert Xpress SARS-CoV-2/FLU/RSV  testing.  Fact Sheet for Patients: PinkCheek.be  Fact Sheet for Healthcare Providers: GravelBags.it  This test is not yet approved or cleared by the [place] FDA and  has been authorized for detection and/or diagnosis of SARS-CoV-2 by  FDA under an Emergency Use Authorization (EUA). This EUA will remain  in effect (meaning this test can be used) for the duration of the  Covid-19 declaration under Section 564(b)(1) of the Act, 21  U.S.C. section 360bbb-3(b)(1), unless the authorization is  terminated or revoked. Performed at [facility], [address] [place], [place] [address]      Labs: BNP (last 3 results) Recent Labs    10/26/19 1939  BNP  034.7*   Basic Metabolic Panel: Recent Labs  Lab 10/28/19 0400 10/29/19 0244 10/30/19 0208 10/31/19 0330 11/01/19 0649  NA 125* 124* 125* 126* 126*  K 3.6 3.4* 3.9 3.5 3.9  CL 91* 88* 87* 87* 87*  CO2 25 25 28 29 26   GLUCOSE 82 86 82 86 97  BUN 15 13 15 19  34*  CREATININE 1.13* 0.96 1.09* 1.24* 1.54*  CALCIUM 8.6* 8.8* 8.9 9.1 9.1   Liver Function Tests: No results for input(s): AST, ALT, ALKPHOS, BILITOT, PROT, ALBUMIN in the last 168 hours. No results for input(s): LIPASE, AMYLASE in the last 168 hours. No results for input(s): AMMONIA in the last 168 hours. CBC: Recent Labs  Lab 10/26/19 1939  WBC 10.9*  NEUTROABS 7.9*  HGB 11.6*  HCT 34.2*  MCV 83.6  PLT 333   Cardiac Enzymes: No results for input(s): CKTOTAL, CKMB, CKMBINDEX, TROPONINI in the last 168 hours. BNP: Invalid input(s): POCBNP CBG: No results for input(s): GLUCAP in the last 168 hours. D-Dimer No results for input(s): DDIMER in the last 72 hours. Hgb A1c No results for input(s): HGBA1C in the last 72 hours. Lipid Profile No results for input(s): CHOL, HDL, LDLCALC, TRIG, CHOLHDL, LDLDIRECT in the last 72 hours. Thyroid function studies No results for input(s): TSH, T4TOTAL, T3FREE, THYROIDAB in the last 72 hours.  Invalid input(s): FREET3 Anemia work up No results for input(s): VITAMINB12, FOLATE, FERRITIN, TIBC, IRON, RETICCTPCT in the last 72 hours. Urinalysis    Component Value Date/Time   COLORURINE AMBER (A) 10/27/2019 1919   APPEARANCEUR CLOUDY (A) 10/27/2019 1919   LABSPEC 1.009 10/27/2019 1919   PHURINE 6.0 10/27/2019 1919   GLUCOSEU NEGATIVE 10/27/2019 1919   HGBUR NEGATIVE 10/27/2019 1919   BILIRUBINUR NEGATIVE 10/27/2019 1919   BILIRUBINUR negative 06/17/2015 1613   BILIRUBINUR neg 04/20/2012 1054   KETONESUR NEGATIVE 10/27/2019 1919   PROTEINUR 100 (A) 10/27/2019 1919   UROBILINOGEN 0.2 06/17/2015 1613   UROBILINOGEN 0.2 01/01/2012 2347   NITRITE NEGATIVE 10/27/2019 1919    LEUKOCYTESUR LARGE (A) 10/27/2019 1919   Sepsis Labs Invalid input(s): PROCALCITONIN,  WBC,  LACTICIDVEN Microbiology Recent Results (from the past 240 hour(s))  Respiratory Panel  by RT PCR (Flu A&B, Covid) - Nasopharyngeal Swab     Status: None   Collection Time: 10/26/19  9:51 PM   Specimen: Nasopharyngeal Swab  Result Value Ref Range Status   SARS Coronavirus 2 by RT PCR NEGATIVE NEGATIVE Final    Comment: (NOTE) SARS-CoV-2 target nucleic acids are NOT DETECTED.  The SARS-CoV-2 RNA is generally detectable in upper respiratoy specimens during the acute phase of infection. The lowest concentration of SARS-CoV-2 viral copies this assay can detect is 131 copies/mL. A negative result does not preclude SARS-Cov-2 infection and should not be used as the sole basis for treatment or other patient management decisions. A negative result may occur with  improper specimen collection/handling, submission of specimen other than nasopharyngeal swab, presence of viral mutation(s) within the areas targeted by this assay, and inadequate number of viral copies (<131 copies/mL). A negative result must be combined with clinical observations, patient history, and epidemiological information. The expected result is Negative.  Fact Sheet for Patients:  PinkCheek.be  Fact Sheet for Healthcare Providers:  GravelBags.it  This test is no t yet approved or cleared by the [place] FDA and  has been authorized for detection and/or diagnosis of SARS-CoV-2 by FDA under an Emergency Use Authorization (EUA). This EUA will remain  in effect (meaning this test can be used) for the duration of the COVID-19 declaration under Section 564(b)(1) of the Act, 21 U.S.C. section 360bbb-3(b)(1), unless the authorization is terminated or revoked sooner.     Influenza A by PCR NEGATIVE NEGATIVE Final   Influenza B by PCR NEGATIVE NEGATIVE Final     Comment: (NOTE) The Xpert Xpress SARS-CoV-2/FLU/RSV assay is intended as an aid in  the diagnosis of influenza from Nasopharyngeal swab specimens and  should not be used as a sole basis for treatment. Nasal washings and  aspirates are unacceptable for Xpert Xpress SARS-CoV-2/FLU/RSV  testing.  Fact Sheet for Patients: PinkCheek.be  Fact Sheet for Healthcare Providers: GravelBags.it  This test is not yet approved or cleared by the [place] FDA and  has been authorized for detection and/or diagnosis of SARS-CoV-2 by  FDA under an Emergency Use Authorization (EUA). This EUA will remain  in effect (meaning this test can be used) for the duration of the  Covid-19 declaration under Section 564(b)(1) of the Act, 21  U.S.C. section 360bbb-3(b)(1), unless the authorization is  terminated or revoked. Performed at [facility], [address] [place], [place] [address]      Time coordinating discharge: Over 40 minutes  SIGNED:   Charlynne Cousins, MD  [facility] 11/01/2019, 8:32 AM Pager   If 7PM-7AM, please contact night-coverage [email or web address] Password [email or web address]

## 2019-11-15 ENCOUNTER — Other Ambulatory Visit: Payer: Self-pay | Admitting: Cardiology

## 2020-03-14 ENCOUNTER — Ambulatory Visit: Payer: Medicare Other | Admitting: Cardiology

## 2020-03-21 ENCOUNTER — Other Ambulatory Visit: Payer: Self-pay | Admitting: Cardiology

## 2020-03-21 DIAGNOSIS — I10 Essential (primary) hypertension: Secondary | ICD-10-CM

## 2020-03-22 ENCOUNTER — Encounter: Payer: Self-pay | Admitting: Cardiology

## 2020-03-22 ENCOUNTER — Other Ambulatory Visit: Payer: Self-pay

## 2020-03-22 ENCOUNTER — Ambulatory Visit (INDEPENDENT_AMBULATORY_CARE_PROVIDER_SITE_OTHER): Payer: Medicare Other | Admitting: Cardiology

## 2020-03-22 VITALS — BP 152/54 | HR 59 | Ht 66.0 in | Wt 155.0 lb

## 2020-03-22 DIAGNOSIS — I4821 Permanent atrial fibrillation: Secondary | ICD-10-CM

## 2020-03-22 DIAGNOSIS — R011 Cardiac murmur, unspecified: Secondary | ICD-10-CM

## 2020-03-22 DIAGNOSIS — I10 Essential (primary) hypertension: Secondary | ICD-10-CM

## 2020-03-22 DIAGNOSIS — I350 Nonrheumatic aortic (valve) stenosis: Secondary | ICD-10-CM | POA: Diagnosis not present

## 2020-03-22 NOTE — Patient Instructions (Signed)

## 2020-03-22 NOTE — Progress Notes (Unsigned)
Cardiology Office Note:    Date:  03/22/2020   ID:  Erika [name], DOB 06/03/1932, MRN [id number]  PCP:  Erika Roers, MD  Cardiologist:  Erika Dresser, MD PhD  Referring MD: Erika Roers, MD   CC: follow up  History of Present Illness:    Erika [name] is a 87 y.o. female with a hx of hypertension, permanent atrial fibrillation on anticoagulation with apixaban, hypothyroidism, obesity, hyperlipidemia, TIA who is seen in follow up. She is previously a Dr. Wynonia [name] patient..  Per Dr. Thurman Coyer note, had history of esophageal perforation with TEE in 2011. Had cardioversion in 2009 and 2011, cardiolite 2011, echocardiogram 2011 with LVEF 50%.   Today: Here with her daughter today.   Has red eyes, dry cough. She suspects allergies, asked her to monitor and let me know if it doesn't improve.   BP has been largely 150s/50s, HR 50s. LE edema.   Denies chest pain, shortness of breath at rest or with normal exertion. No PND, orthopnea, LE edema or unexpected weight gain. No syncope or palpitations.  ROS for intermittent "shakes" after doing activities like getting dressed. Has discussed with her PCP. Has restarted wellbutrin PRN, has hydroxyzine as well. Added to med list today.  We discussed options for medication management. They want to get new BP cuff (one they have is ~85 years old). We discussed what to look for in a BP cuff.   Appetite is fair, but struggles with unintentional weight loss. Has gone from a size 20 to a size 12. Uses protein drinks/ensure to supplement diet.  Past Medical History:  Diagnosis Date  . Abdominal pain   . Anemia   . ANEMIA 06/15/2009   Qualifier: Diagnosis of  By: Erika [name] CMA, Erika [name]    . Aortic stenosis    moderate AS by 06/2011 echo  . Arthritis   . Atrial fibrillation ([place]) 06/15/2009   Qualifier: Diagnosis of  By: Erika [name] CMA, Erika [name]    . Cardiac arrhythmia   . Gallstones, probable biliary colic 05/11/2990  . GERD (gastroesophageal reflux disease)    . H/O hiatal hernia   . Hyperlipidemia   . HYPERLIPIDEMIA 06/15/2009   Qualifier: Diagnosis of  By: Erika [name] CMA, Erika [name]    . Hypertension   . HYPERTENSION 06/15/2009   Qualifier: Diagnosis of  By: Erika [name] CMA, Erika [name]    . Hyperthyroidism   . Memory disorder 04/04/2016  . Obesity   . OBESITY 06/15/2009   Qualifier: Diagnosis of  By: Erika [name] CMA, Erika [name]    . Seasonal allergies   . SLEEP APNEA 06/16/2009   Qualifier: Diagnosis of  By: Erika Heman, MD, Erika [name]    . Unspecified hypothyroidism 06/15/2009   Qualifier: Diagnosis of  By: Erika [name] CMA, Erika [name]      Past Surgical History:  Procedure Laterality Date  . BREAST SURGERY  2012 (June or July)   right  . CHOLECYSTECTOMY  06/28/2011   Procedure: LAPAROSCOPIC CHOLECYSTECTOMY WITH INTRAOPERATIVE CHOLANGIOGRAM;  Surgeon: Merrie Roof, MD;  Location: [facility];  Service: General;  Laterality: N/A;  . pharyngeal repair  [id number]   Dr Erika [name] for post-TEE pharygeal tear w neck abscess I&D  . THYROIDECTOMY  1961   80% removed    Current Medications: Current Outpatient Medications on File Prior to Visit  Medication Sig  . amLODipine (NORVASC) 10 MG tablet TAKE 1 TABLET BY MOUTH EVERY DAY  . desloratadine (CLARINEX) 5 MG tablet Take 5 mg by mouth in the morning.  Marland Kitchen ELIQUIS 5 MG TABS tablet  TAKE 1 TABLET BY MOUTH TWICE A DAY (Patient taking differently: Take 5 mg by mouth 2 (two) times daily.)  . ergocalciferol (VITAMIN D2) 50000 UNITS capsule Take 50,000 Units by mouth every Thursday.   . furosemide (LASIX) 20 MG tablet Take 0.5 tablets (10 mg total) by mouth in the morning.  . hydrALAZINE (APRESOLINE) 50 MG tablet Take 1 tablet (50 mg total) by mouth in the morning and at bedtime.  Marland Kitchen HYDROcodone-acetaminophen (NORCO) 10-325 MG tablet Take 0.5-1 tablets by mouth 2 (two) times daily as needed (for pain).   Marland Kitchen lisinopril (ZESTRIL) 40 MG tablet Take 0.5 tablets (20 mg total) by mouth daily.  Marland Kitchen omega-3 acid ethyl esters (LOVAZA) 1 G capsule Take 1 g by mouth in the morning.   Marland Kitchen omeprazole (PRILOSEC) 20 MG capsule Take 20 mg by mouth at bedtime.   . Red Yeast Rice Extract 600 MG CAPS Take 1,200 mg by mouth daily with breakfast.  . SYNTHROID 200 MCG tablet Take 200 mcg by mouth daily before breakfast.   No current facility-administered medications on file prior to visit.     Allergies:   Celecoxib, Amiodarone, Dronedarone hydrochloride, Ezetimibe, Flecainide acetate, Lisinopril, Namenda [memantine hcl], and Sulfonamide derivatives   Social History   Tobacco Use  . Smoking status: Never Smoker  . Smokeless tobacco: Never Used  Vaping Use  . Vaping Use: Never used  Substance Use Topics  . Alcohol use: No  . Drug use: No    Family History: The patient's family history includes Cancer in her maternal aunt; Heart disease in her father and mother. Brother with CAD, another brother with CAD and hyperlipidemia, another brother with CAD. Both father and mother deceased, both had CAD. Sister has cancer and CAD.  ROS:   Please see the history of present illness.  Additional pertinent ROS negative except as documented.  EKGs/Labs/Other Studies Reviewed:    The following studies were reviewed today: Notes/studies from Dr. Wynonia [name] previously  EKG:  EKG is personally reviewed.  The ekg ordered 03/23/19 demonstrates atrial fibrillation with slow ventricular response, heart rate 59 bpm  Recent Labs: 10/26/2019: B Natriuretic Peptide 252.2; Hemoglobin 11.6; Platelets 333 10/27/2019: TSH 0.043 11/01/2019: BUN 34; Creatinine, Ser 1.54; Potassium 3.9; Sodium 126  Recent Lipid Panel    Component Value Date/Time   CHOL 303 (H) 06/23/2011 0635   TRIG 61 06/23/2011 0635   HDL 52 06/23/2011 0635   CHOLHDL 5.8 06/23/2011 0635   VLDL 12 06/23/2011 0635   LDLCALC 239 (H) 06/23/2011 0635    Physical Exam:    VS:  BP (!) 152/54   Pulse (!) 59   Ht 5\' 6"  (1.676 m)   Wt 155 lb (70.3 kg)   SpO2 99%   BMI 25.02 kg/m     Wt Readings from Last 3 Encounters:  03/22/20  155 lb (70.3 kg)  11/01/19 140 lb 14.4 oz (63.9 kg)  09/09/19 158 lb 6.4 oz (71.8 kg)    GEN: Well nourished, well developed in no acute distress HEENT: Normal, moist mucous membranes NECK: No JVD CARDIAC: slightly irregular rhythm, normal S1 and S2, no rubs or gallops. 2/6 systolic murmur. VASCULAR: Radial and DP pulses 2+ bilaterally. No carotid bruits RESPIRATORY:  Clear to auscultation without rales, wheezing or rhonchi  ABDOMEN: Soft, non-tender, non-distended MUSCULOSKELETAL:  Ambulates independently SKIN: Warm and dry, trace bilateral LE edema NEUROLOGIC:  Alert and oriented x 3. No focal neuro deficits noted. PSYCHIATRIC:  Normal affect   ASSESSMENT:  1. Permanent atrial fibrillation ([place])   2. Essential hypertension   3. Murmur, cardiac   4. Aortic valve stenosis, etiology of cardiac valve disease unspecified    PLAN:    hypertension: -has been difficult to manage, balance between elevated systolic pressures and low diastolic numbers -continue amlodipine 10 mg daily. Trivial nonpitting bilateral LE edema -had to stop carvedilol due to bradycardia. -continue lisinopril 40 mg daily -continue furosemide 10 mg daily -K was 5.2, unable to start spironolactone -continue hydralazine 50 mg BID. Gave instructions today on how to manage if BP low. -ok to use 10 mg PRN hydralazine for elevated systolic blood pressures -continue taking daily blood pressures. They will get a new cuff as home readings have been variable. -counseled on red flag signs that need immediate medical attention  Atrial fibrillation, permanent: history of TIA -CHA2DS2/VAS Stroke Risk Points = 6 (HTN, age, TIA, gender) -tolerating anticoagulation, on apixaban 5 mg BID.  -rate controlled on no AV nodal agents, was bradycardic with low dose carvedilol  Murmur: consistent with aortic stenosis murmur. Early peaking.  -last echo 2013 with moderate stenosis -asymptomatic -repeat echo for worsening  symptoms -would be high risk TEE for TAVR given prior perforation  LE edema: trace, well controlled on 10 mg lasix daily  Unintentional weight loss: working on improving nutrition with ensure. Weight improved from last visit  Plan for follow up: 6 mos or sooner as needed.  Medication Adjustments/Labs and Tests Ordered: Current medicines are reviewed at length with the patient today.  Concerns regarding medicines are outlined above.  Orders Placed This Encounter  Procedures  . EKG 12-Lead   No orders of the defined types were placed in this encounter.   Patient Instructions  Medication Instructions:  Your Physician recommend you continue on your current medication as directed.    *If you need a refill on your cardiac medications before your next appointment, please call your pharmacy*   Lab Work: None   Testing/Procedures: None   Follow-Up: At [facility], you and your health needs are our priority.  As part of our continuing mission to provide you with exceptional heart care, we have created designated Provider Care Teams.  These Care Teams include your primary Cardiologist (physician) and Advanced Practice Providers (APPs -  Physician Assistants and Nurse Practitioners) who all work together to provide you with the care you need, when you need it.  We recommend signing up for the patient portal called "MyChart".  Sign up information is provided on this After Visit Summary.  MyChart is used to connect with patients for Virtual Visits (Telemedicine).  Patients are able to view lab/test results, encounter notes, upcoming appointments, etc.  Non-urgent messages can be sent to your provider as well.   To learn more about what you can do with MyChart, go to [email or web address].    Your next appointment:   6 month(s)  The format for your next appointment:   In Person  Provider:   Buford Dresser, MD      Signed, Erika Dresser, MD PhD 03/22/2020   [place]

## 2020-03-24 ENCOUNTER — Encounter: Payer: Self-pay | Admitting: Cardiology

## 2020-04-16 ENCOUNTER — Other Ambulatory Visit: Payer: Self-pay | Admitting: Cardiology

## 2020-04-16 DIAGNOSIS — I10 Essential (primary) hypertension: Secondary | ICD-10-CM

## 2020-08-15 ENCOUNTER — Ambulatory Visit (INDEPENDENT_AMBULATORY_CARE_PROVIDER_SITE_OTHER): Payer: Medicare Other | Admitting: Cardiology

## 2020-08-15 ENCOUNTER — Encounter (HOSPITAL_BASED_OUTPATIENT_CLINIC_OR_DEPARTMENT_OTHER): Payer: Self-pay | Admitting: Cardiology

## 2020-08-15 ENCOUNTER — Other Ambulatory Visit: Payer: Self-pay

## 2020-08-15 VITALS — BP 100/58 | HR 60 | Ht 66.0 in | Wt 153.0 lb

## 2020-08-15 DIAGNOSIS — I350 Nonrheumatic aortic (valve) stenosis: Secondary | ICD-10-CM | POA: Diagnosis not present

## 2020-08-15 DIAGNOSIS — R011 Cardiac murmur, unspecified: Secondary | ICD-10-CM | POA: Diagnosis not present

## 2020-08-15 DIAGNOSIS — I10 Essential (primary) hypertension: Secondary | ICD-10-CM

## 2020-08-15 DIAGNOSIS — Z8673 Personal history of transient ischemic attack (TIA), and cerebral infarction without residual deficits: Secondary | ICD-10-CM

## 2020-08-15 DIAGNOSIS — I482 Chronic atrial fibrillation, unspecified: Secondary | ICD-10-CM | POA: Diagnosis not present

## 2020-08-15 NOTE — Progress Notes (Signed)
Cardiology Office Note:    Date:  08/25/2020   ID:  Erika [name], DOB February 15, 1932, MRN [id number]  PCP:  Tamsen Roers, MD  Cardiologist:  Buford Dresser, MD PhD  Referring MD: Tamsen Roers, MD   CC: follow up  History of Present Illness:    Erika [name] is a 87 y.o. female with a hx of hypertension, permanent atrial fibrillation on anticoagulation with apixaban, hypothyroidism, obesity, hyperlipidemia, TIA who is seen in follow up. She is previously a Dr. Wynonia Lawman patient.  Per Dr. Thurman Coyer note, had history of esophageal perforation with TEE in 2011. Had cardioversion in 2009 and 2011, cardiolite 2011, echocardiogram 2011 with LVEF 50%.   Today: She is accompanied by a family member, who also provides some history.   Overall, she is feeling okay and has no new complaints today. Her daughter reports that she often says "Nothing hurts but I feel terrible." At home her blood pressure is typically in the 140s.  Since her last visit, she has had a few mechanical falls, and injured her head during multiple falls. Occasionally, her daughter notes that she states that "her head doesn't feel right." She denies dizziness or lightheadedness.   She endorses some LE edema. Furosemide was recently increased. She has been having tremors as well. They can become severe enough that she is unable to support herself physically.  While active, she will typically get short of breath when walking short distances.  At night she sleeps well aside from an intermittent dry cough.  She denies any chest pain, headaches, syncope, orthopnea, or PND.    Past Medical History:  Diagnosis Date   Abdominal pain    Anemia    ANEMIA 06/15/2009   Qualifier: Diagnosis of  By: Selena Batten CMA, Jewel     Aortic stenosis    moderate AS by 06/2011 echo   Arthritis    Atrial fibrillation ([place]) 06/15/2009   Qualifier: Diagnosis of  By: Selena Batten CMA, Jewel     Cardiac arrhythmia    Gallstones, probable biliary colic  [id number]   GERD (gastroesophageal reflux disease)    H/O hiatal hernia    Hyperlipidemia    HYPERLIPIDEMIA 06/15/2009   Qualifier: Diagnosis of  By: Selena Batten CMA, Jewel     Hypertension    HYPERTENSION 06/15/2009   Qualifier: Diagnosis of  By: Selena Batten CMA, Jewel     Hyperthyroidism    Memory disorder 04/04/2016   Obesity    OBESITY 06/15/2009   Qualifier: Diagnosis of  By: Selena Batten CMA, Jewel     Seasonal allergies    SLEEP APNEA 06/16/2009   Qualifier: Diagnosis of  By: Rayann Heman, MD, James     Unspecified hypothyroidism 06/15/2009   Qualifier: Diagnosis of  By: Selena Batten CMA, Jewel      Past Surgical History:  Procedure Laterality Date   BREAST SURGERY  2012 (June or July)   right   CHOLECYSTECTOMY  06/28/2011   Procedure: LAPAROSCOPIC CHOLECYSTECTOMY WITH INTRAOPERATIVE CHOLANGIOGRAM;  Surgeon: Merrie Roof, MD;  Location: [place];  Service: General;  Laterality: N/A;   pharyngeal repair  [id number]   Dr Constance Holster for post-TEE pharygeal tear w neck abscess I&D   THYROIDECTOMY  1961   80% removed    Current Medications: Current Outpatient Medications on File Prior to Visit  Medication Sig   amLODipine (NORVASC) 10 MG tablet TAKE 1 TABLET BY MOUTH EVERY DAY   buPROPion (WELLBUTRIN XL) 150 MG 24 hr tablet Take 150 mg by mouth every morning.  desloratadine (CLARINEX) 5 MG tablet Take 5 mg by mouth in the morning.   ELIQUIS 5 MG TABS tablet TAKE 1 TABLET BY MOUTH TWICE A DAY (Patient taking differently: Take 5 mg by mouth 2 (two) times daily.)   ergocalciferol (VITAMIN D2) 50000 UNITS capsule Take 50,000 Units by mouth every Thursday.    furosemide (LASIX) 20 MG tablet Take 0.5 tablets (10 mg total) by mouth in the morning. (Patient taking differently: Take 40 mg by mouth in the morning.)   hydrALAZINE (APRESOLINE) 50 MG tablet TAKE 1 TABLET BY MOUTH IN THE MORNING AND AT BEDTIME.   HYDROcodone-acetaminophen (NORCO) 10-325 MG tablet Take 0.5-1 tablets by mouth 2 (two) times daily as needed (for pain).     hydrOXYzine (ATARAX/VISTARIL) 10 MG tablet Take 20 mg by mouth daily as needed.   lisinopril (ZESTRIL) 40 MG tablet Take 0.5 tablets (20 mg total) by mouth daily.   Red Yeast Rice Extract 600 MG CAPS Take 1,200 mg by mouth daily with breakfast.   SYNTHROID 125 MCG tablet Take 125 mcg by mouth daily.   omega-3 acid ethyl esters (LOVAZA) 1 G capsule Take 1 g by mouth in the morning. (Patient not taking: Reported on 08/15/2020)   omeprazole (PRILOSEC) 20 MG capsule Take 20 mg by mouth at bedtime.  (Patient not taking: Reported on 08/15/2020)   No current facility-administered medications on file prior to visit.     Allergies:   Celecoxib, Amiodarone, Dronedarone hydrochloride, Ezetimibe, Flecainide acetate, Lisinopril, Namenda [memantine hcl], and Sulfonamide derivatives   Social History   Tobacco Use   Smoking status: Never   Smokeless tobacco: Never  Vaping Use   Vaping Use: Never used  Substance Use Topics   Alcohol use: No   Drug use: No    Family History: The patient's family history includes Cancer in her maternal aunt; Heart disease in her father and mother. Brother with CAD, another brother with CAD and hyperlipidemia, another brother with CAD. Both father and mother deceased, both had CAD. Sister has cancer and CAD.  ROS:   Please see the history of present illness.   (+) Mechanical Falls (+) LE edema (+) Tremors (+) Exertional shortness of breath (+) Cough Additional pertinent ROS negative except as documented.  EKGs/Labs/Other Studies Reviewed:    The following studies were reviewed today: Notes/studies from Dr. Wynonia Lawman previously  Echo 10/28/2019: 1. Left ventricular ejection fraction, by estimation, is 60 to 65%. The  left ventricle has normal function. The left ventricle has no regional  wall motion abnormalities. Left ventricular diastolic function could not  be evaluated.   2. Right ventricular systolic function is low normal. The right  ventricular size is  normal. There is severely elevated pulmonary artery  systolic pressure.   3. Left atrial size was severely dilated.   4. Right atrial size was severely dilated.   5. The mitral valve is normal in structure. Mild mitral valve  regurgitation. No evidence of mitral stenosis. Severe mitral annular  calcification.   6. Tricuspid valve regurgitation is mild to moderate.   7. The aortic valve is tricuspid. There is moderate calcification of the  aortic valve. There is mild thickening of the aortic valve. Aortic valve  regurgitation is trivial. Mild to moderate aortic valve stenosis.   8. The inferior vena cava is dilated in size with >50% respiratory  variability, suggesting right atrial pressure of 8 mmHg.   Comparison(s): No significant change from prior study.   Conclusion(s)/Recommendation(s): Otherwise normal echocardiogram, with  minor abnormalities described in the report.   EKG:  EKG is personally reviewed.   08/15/2020: Atrial fibrillation at 60 bpm 03/22/20: atrial fibrillation with slow ventricular response, heart rate 59 bpm  Recent Labs: 10/26/2019: B Natriuretic Peptide 252.2; Hemoglobin 11.6; Platelets 333 10/27/2019: TSH 0.043 11/01/2019: BUN 34; Creatinine, Ser 1.54; Potassium 3.9; Sodium 126  Recent Lipid Panel    Component Value Date/Time   CHOL 303 (H) 06/23/2011 0635   TRIG 61 06/23/2011 0635   HDL 52 06/23/2011 0635   CHOLHDL 5.8 06/23/2011 0635   VLDL 12 06/23/2011 0635   LDLCALC 239 (H) 06/23/2011 0635    Physical Exam:    VS:  BP (!) 100/58   Pulse 60   Ht '5\' 6"'$  (1.676 m)   Wt 153 lb (69.4 kg)   BMI 24.69 kg/m     Wt Readings from Last 3 Encounters:  08/15/20 153 lb (69.4 kg)  03/22/20 155 lb (70.3 kg)  11/01/19 140 lb 14.4 oz (63.9 kg)    GEN: Well nourished, well developed in no acute distress HEENT: Normal, moist mucous membranes NECK: No JVD CARDIAC: slightly irregular rhythm, normal S1 and S2, no rubs or gallops. 2/6 systolic murmur. VASCULAR:  Radial and DP pulses 2+ bilaterally. No carotid bruits RESPIRATORY:  Clear to auscultation without rales, wheezing or rhonchi  ABDOMEN: Soft, non-tender, non-distended MUSCULOSKELETAL:  Ambulates independently SKIN: Warm and dry, trace bilateral LE edema NEUROLOGIC:  Alert and oriented x 3. No focal neuro deficits noted. PSYCHIATRIC:  Normal affect   ASSESSMENT:    1. Atrial fibrillation, chronic ([place])   2. Essential hypertension   3. Murmur, cardiac   4. Aortic valve stenosis, etiology of cardiac valve disease unspecified   5. History of TIA (transient ischemic attack)     PLAN:    hypertension:  -has been difficult to manage, balance between elevated systolic pressures and low diastolic numbers -continue amlodipine 10 mg daily. Trivial nonpitting bilateral LE edema -had to stop carvedilol due to bradycardia. -continue lisinopril 40 mg daily -continue furosemide 40 mg daily -unable to start spironolactone previously due to hyperkalemia -continue hydralazine 50 mg BID. Gave instructions today on how to manage if BP low. -ok to use 10 mg PRN hydralazine for elevated systolic blood pressures -continue taking daily blood pressures. -counseled on red flag signs that need immediate medical attention    Atrial fibrillation, permanent: history of TIA -CHA2DS2/VAS Stroke Risk Points  = 6 (HTN, age, TIA, gender) -tolerating anticoagulation, on apixaban 5 mg BID.  -rate controlled on no AV nodal agents, was bradycardic with low dose carvedilol -discussed that if she falls and hits her head, she should present to ER for CT scan to check for bleeding  Murmur: consistent with aortic stenosis murmur. Early peaking.  -last echo 2013 with moderate stenosis -asymptomatic -repeat echo for worsening symptoms -would be high risk TEE for TAVR given prior perforation   Plan for follow up: 6 mos or sooner as needed.  Medication Adjustments/Labs and Tests Ordered: Current medicines are reviewed  at length with the patient today.  Concerns regarding medicines are outlined above.  Orders Placed This Encounter  Procedures   EKG 12-Lead    No orders of the defined types were placed in this encounter.  Patient Instructions  Medication Instructions:  Your Physician recommend you continue on your current medication as directed.    *If you need a refill on your cardiac medications before your next appointment, please call your pharmacy*   Lab  Work: None ordered today   Testing/Procedures: None ordered today   Follow-Up: At [facility], you and your health needs are our priority.  As part of our continuing mission to provide you with exceptional heart care, we have created designated Provider Care Teams.  These Care Teams include your primary Cardiologist (physician) and Advanced Practice Providers (APPs -  Physician Assistants and Nurse Practitioners) who all work together to provide you with the care you need, when you need it.  We recommend signing up for the patient portal called "MyChart".  Sign up information is provided on this After Visit Summary.  MyChart is used to connect with patients for Virtual Visits (Telemedicine).  Patients are able to view lab/test results, encounter notes, upcoming appointments, etc.  Non-urgent messages can be sent to your provider as well.   To learn more about what you can do with MyChart, go to [email or web address].    Your next appointment:   6 month(s)  The format for your next appointment:   In Person  Provider:   Buford Dresser, MD     [facility] Stumpf,acting as a scribe for Buford Dresser, MD.,have documented all relevant documentation on the behalf of Buford Dresser, MD,as directed by  Buford Dresser, MD while in the presence of Buford Dresser, MD.  I, Buford Dresser, MD, have reviewed all documentation for this visit. The documentation on 08/25/20 for the exam, diagnosis,  procedures, and orders are all accurate and complete.   Signed, Buford Dresser, MD PhD 08/25/2020  [place]

## 2020-08-15 NOTE — Patient Instructions (Signed)

## 2020-08-17 ENCOUNTER — Ambulatory Visit: Payer: Medicare Other | Admitting: Adult Health

## 2020-09-05 ENCOUNTER — Ambulatory Visit (HOSPITAL_BASED_OUTPATIENT_CLINIC_OR_DEPARTMENT_OTHER): Payer: Medicare Other | Admitting: Cardiology

## 2021-02-20 ENCOUNTER — Encounter (HOSPITAL_BASED_OUTPATIENT_CLINIC_OR_DEPARTMENT_OTHER): Payer: Self-pay | Admitting: Cardiology

## 2021-02-20 ENCOUNTER — Other Ambulatory Visit: Payer: Self-pay

## 2021-02-20 ENCOUNTER — Ambulatory Visit (INDEPENDENT_AMBULATORY_CARE_PROVIDER_SITE_OTHER): Payer: Medicare Other | Admitting: Cardiology

## 2021-02-20 VITALS — BP 130/58 | HR 63 | Ht 66.0 in | Wt 161.0 lb

## 2021-02-20 DIAGNOSIS — I350 Nonrheumatic aortic (valve) stenosis: Secondary | ICD-10-CM | POA: Diagnosis not present

## 2021-02-20 DIAGNOSIS — I482 Chronic atrial fibrillation, unspecified: Secondary | ICD-10-CM

## 2021-02-20 DIAGNOSIS — Z8673 Personal history of transient ischemic attack (TIA), and cerebral infarction without residual deficits: Secondary | ICD-10-CM

## 2021-02-20 DIAGNOSIS — I10 Essential (primary) hypertension: Secondary | ICD-10-CM | POA: Diagnosis not present

## 2021-02-20 DIAGNOSIS — R011 Cardiac murmur, unspecified: Secondary | ICD-10-CM

## 2021-02-20 NOTE — Progress Notes (Signed)
Cardiology Office Note:    Date:  02/20/2021   ID:  Erika [name], DOB 02-25-1932, MRN [id number]  PCP:  Leonides Sake, MD  Cardiologist:  Buford Dresser, MD PhD  Referring MD: Tamsen Roers, MD   CC: follow up  History of Present Illness:    Erika [name] is a 88 y.o. female with a hx of hypertension, permanent atrial fibrillation on anticoagulation with apixaban, hypothyroidism, obesity, hyperlipidemia, TIA who is seen in follow up. She is previously a Dr. Wynonia Lawman patient.  Per Dr. Thurman Coyer note, had history of esophageal perforation with TEE in 2011. Had cardioversion in 2009 and 2011, cardiolite 2011, echocardiogram 2011 with LVEF 50%.   Today: She is accompanied by her daughter. Overall, she is feeling okay with no new complaints. They present a BP log today, with a range of blood pressures including 126/53, 150/52, with symptoms of shakiness. Highest BP 179/66, nothing <120/50. On average her blood pressure seems well-controlled.  Her daughter reports that she continues to have bilateral LE edema. Her PCP increased Lasix to 40 mg but they have not noticed much difference.  Occasionally, she continues to have sensations that she describes as "her head doesn't feel right." This usually begins in the mornings if it occurs.  She denies any palpitations, chest pain, or shortness of breath. No headaches, syncope, orthopnea, or PND.   Past Medical History:  Diagnosis Date   Abdominal pain    Anemia    ANEMIA 06/15/2009   Qualifier: Diagnosis of  By: Selena Batten CMA, Jewel     Aortic stenosis    moderate AS by 06/2011 echo   Arthritis    Atrial fibrillation ([place]) 06/15/2009   Qualifier: Diagnosis of  By: Selena Batten CMA, Jewel     Cardiac arrhythmia    Gallstones, probable biliary colic 05/10/8411   GERD (gastroesophageal reflux disease)    H/O hiatal hernia    Hyperlipidemia    HYPERLIPIDEMIA 06/15/2009   Qualifier: Diagnosis of  By: Selena Batten CMA, Jewel     Hypertension    HYPERTENSION  06/15/2009   Qualifier: Diagnosis of  By: Selena Batten CMA, Jewel     Hyperthyroidism    Memory disorder 04/04/2016   Obesity    OBESITY 06/15/2009   Qualifier: Diagnosis of  By: Selena Batten CMA, Jewel     Seasonal allergies    SLEEP APNEA 06/16/2009   Qualifier: Diagnosis of  By: Rayann Heman, MD, James     Unspecified hypothyroidism 06/15/2009   Qualifier: Diagnosis of  By: Selena Batten CMA, Jewel      Past Surgical History:  Procedure Laterality Date   BREAST SURGERY  2012 (June or July)   right   CHOLECYSTECTOMY  06/28/2011   Procedure: LAPAROSCOPIC CHOLECYSTECTOMY WITH INTRAOPERATIVE CHOLANGIOGRAM;  Surgeon: Merrie Roof, MD;  Location: [facility];  Service: General;  Laterality: N/A;   pharyngeal repair  [id number]   Dr Constance Holster for post-TEE pharygeal tear w neck abscess I&D   THYROIDECTOMY  1961   80% removed    Current Medications: Current Outpatient Medications on File Prior to Visit  Medication Sig   amLODipine (NORVASC) 10 MG tablet TAKE 1 TABLET BY MOUTH EVERY DAY   buPROPion (WELLBUTRIN XL) 150 MG 24 hr tablet Take 150 mg by mouth every morning.   desloratadine (CLARINEX) 5 MG tablet Take 5 mg by mouth in the morning.   ELIQUIS 5 MG TABS tablet TAKE 1 TABLET BY MOUTH TWICE A DAY (Patient taking differently: Take 5 mg by mouth 2 (two) times  daily.)   ergocalciferol (VITAMIN D2) 50000 UNITS capsule Take 50,000 Units by mouth every Thursday.    furosemide (LASIX) 40 MG tablet Take 20 mg by mouth daily.   hydrALAZINE (APRESOLINE) 50 MG tablet TAKE 1 TABLET BY MOUTH IN THE MORNING AND AT BEDTIME.   HYDROcodone-acetaminophen (NORCO) 10-325 MG tablet Take 0.5-1 tablets by mouth 2 (two) times daily as needed (for pain).    hydrOXYzine (ATARAX/VISTARIL) 10 MG tablet Take 20 mg by mouth daily as needed.   lisinopril (ZESTRIL) 40 MG tablet Take 0.5 tablets (20 mg total) by mouth daily.   SYNTHROID 125 MCG tablet Take 125 mcg by mouth daily.   No current facility-administered medications on file prior to visit.      Allergies:   Celecoxib, Amiodarone, Dronedarone hydrochloride, Ezetimibe, Flecainide acetate, Lisinopril, Namenda [memantine hcl], and Sulfonamide derivatives   Social History   Tobacco Use   Smoking status: Never   Smokeless tobacco: Never  Vaping Use   Vaping Use: Never used  Substance Use Topics   Alcohol use: No   Drug use: No    Family History: The patient's family history includes Cancer in her maternal aunt; Heart disease in her father and mother. Brother with CAD, another brother with CAD and hyperlipidemia, another brother with CAD. Both father and mother deceased, both had CAD. Sister has cancer and CAD.  ROS:   Please see the history of present illness.   (+) Bilateral LE edema Additional pertinent ROS negative except as documented.  EKGs/Labs/Other Studies Reviewed:    The following studies were reviewed today: Notes/studies from Dr. Wynonia Lawman previously  Echo 10/28/2019: 1. Left ventricular ejection fraction, by estimation, is 60 to 65%. The  left ventricle has normal function. The left ventricle has no regional  wall motion abnormalities. Left ventricular diastolic function could not  be evaluated.   2. Right ventricular systolic function is low normal. The right  ventricular size is normal. There is severely elevated pulmonary artery  systolic pressure.   3. Left atrial size was severely dilated.   4. Right atrial size was severely dilated.   5. The mitral valve is normal in structure. Mild mitral valve  regurgitation. No evidence of mitral stenosis. Severe mitral annular  calcification.   6. Tricuspid valve regurgitation is mild to moderate.   7. The aortic valve is tricuspid. There is moderate calcification of the  aortic valve. There is mild thickening of the aortic valve. Aortic valve  regurgitation is trivial. Mild to moderate aortic valve stenosis.   8. The inferior vena cava is dilated in size with >50% respiratory  variability, suggesting right  atrial pressure of 8 mmHg.   Comparison(s): No significant change from prior study.   Conclusion(s)/Recommendation(s): Otherwise normal echocardiogram, with minor abnormalities described in the report.   EKG:  EKG is personally reviewed.   02/20/2021: atrial fib at 63 bpm 08/15/2020: Atrial fibrillation at 60 bpm 03/22/20: atrial fibrillation with slow ventricular response, heart rate 59 bpm  Recent Labs: No results found for requested labs within last 8760 hours.   Recent Lipid Panel    Component Value Date/Time   CHOL 303 (H) 06/23/2011 0635   TRIG 61 06/23/2011 0635   HDL 52 06/23/2011 0635   CHOLHDL 5.8 06/23/2011 0635   VLDL 12 06/23/2011 0635   LDLCALC 239 (H) 06/23/2011 0635    Physical Exam:    VS:  BP (!) 130/58 (BP Location: Left Arm, Patient Position: Sitting, Cuff Size: Normal)    Pulse  63    Ht 5\' 6"  (1.676 m)    Wt 161 lb (73 kg)    BMI 25.99 kg/m     Wt Readings from Last 3 Encounters:  02/20/21 161 lb (73 kg)  08/15/20 153 lb (69.4 kg)  03/22/20 155 lb (70.3 kg)    GEN: Well nourished, well developed in no acute distress HEENT: Normal, moist mucous membranes NECK: No JVD CARDIAC: slightly irregular rhythm, normal S1 and S2, no rubs or gallops. 2/6 systolic murmur. Early peaking with preserved S2. VASCULAR: Radial and DP pulses 2+ bilaterally. No carotid bruits RESPIRATORY:  Clear to auscultation without rales, wheezing or rhonchi  ABDOMEN: Soft, non-tender, non-distended MUSCULOSKELETAL:  Ambulates independently SKIN: Warm and dry, no LE edema NEUROLOGIC:  Alert and oriented x 3. No focal neuro deficits noted. PSYCHIATRIC:  Normal affect   ASSESSMENT:    1. Atrial fibrillation, chronic ([place])   2. Essential hypertension   3. Nonrheumatic aortic valve stenosis   4. Murmur, cardiac   5. History of TIA (transient ischemic attack)    PLAN:    hypertension:  -has been difficult to manage, balance between elevated systolic pressures and low diastolic  numbers -continue amlodipine 10 mg daily. No edema on exam (legs are "squishable.") -had to stop carvedilol due to bradycardia. -continue lisinopril 40 mg daily -continue furosemide 20 mg daily (no change on 40 mg dose) -unable to start spironolactone previously due to hyperkalemia -continue hydralazine 50 mg BID -ok to use 10 mg PRN hydralazine for elevated systolic blood pressures -continue taking daily blood pressures. -counseled on red flag signs that need immediate medical attention    Atrial fibrillation, permanent: history of TIA -CHA2DS2/VAS Stroke Risk Points  = 6 (HTN, age, TIA, gender) -tolerating anticoagulation, on apixaban 5 mg BID.  -rate controlled on no AV nodal agents, was bradycardic with low dose carvedilol -discussed that if she falls and hits her head, she should present to ER for CT scan to check for bleeding  Murmur: consistent with aortic stenosis murmur. Early peaking.  -last echo 2013 with moderate stenosis -asymptomatic -repeat echo for worsening symptoms -would be high risk TEE for TAVR given prior perforation   Plan for follow up: 6 mos or sooner as needed.  Medication Adjustments/Labs and Tests Ordered: Current medicines are reviewed at length with the patient today.  Concerns regarding medicines are outlined above.   Orders Placed This Encounter  Procedures   EKG 12-Lead   No orders of the defined types were placed in this encounter.  Patient Instructions  Medication Instructions:  Your Physician recommend you continue on your current medication as directed.    *If you need a refill on your cardiac medications before your next appointment, please call your pharmacy*   Lab Work: None ordered today   Testing/Procedures: None ordered today   Follow-Up: At [facility], you and your health needs are our priority.  As part of our continuing mission to provide you with exceptional heart care, we have created designated Provider Care Teams.   These Care Teams include your primary Cardiologist (physician) and Advanced Practice Providers (APPs -  Physician Assistants and Nurse Practitioners) who all work together to provide you with the care you need, when you need it.  We recommend signing up for the patient portal called "MyChart".  Sign up information is provided on this After Visit Summary.  MyChart is used to connect with patients for Virtual Visits (Telemedicine).  Patients are able to view lab/test results, encounter  notes, upcoming appointments, etc.  Non-urgent messages can be sent to your provider as well.   To learn more about what you can do with MyChart, go to [email or web address].    Your next appointment:   6 month(s)  The format for your next appointment:   In Person  Provider:   Buford Dresser, MD       [facility] Stumpf,acting as a scribe for Buford Dresser, MD.,have documented all relevant documentation on the behalf of Buford Dresser, MD,as directed by  Buford Dresser, MD while in the presence of Buford Dresser, MD.  I, Buford Dresser, MD, have reviewed all documentation for this visit. The documentation on 02/20/21 for the exam, diagnosis, procedures, and orders are all accurate and complete.   Signed, Buford Dresser, MD PhD 02/20/2021  [place]

## 2021-02-20 NOTE — Patient Instructions (Signed)

## 2021-03-14 ENCOUNTER — Encounter (HOSPITAL_BASED_OUTPATIENT_CLINIC_OR_DEPARTMENT_OTHER): Payer: Self-pay

## 2021-05-12 ENCOUNTER — Other Ambulatory Visit: Payer: Self-pay | Admitting: Cardiology

## 2021-05-12 DIAGNOSIS — I10 Essential (primary) hypertension: Secondary | ICD-10-CM

## 2021-09-13 ENCOUNTER — Telehealth (HOSPITAL_BASED_OUTPATIENT_CLINIC_OR_DEPARTMENT_OTHER): Payer: Self-pay | Admitting: Cardiology

## 2021-09-13 NOTE — Telephone Encounter (Signed)
Patients daughter stated that patient has a mild cough and she thinks it is allergies. I stated I would reach out to her nurse to ask if that is okay. Please advise if need to reschedule or if they can keep the appointment.

## 2021-09-13 NOTE — Telephone Encounter (Signed)
Pt ok to attend appointment, just request she wear a mask.

## 2021-09-14 ENCOUNTER — Ambulatory Visit (INDEPENDENT_AMBULATORY_CARE_PROVIDER_SITE_OTHER): Payer: Medicare Other | Admitting: Cardiology

## 2021-09-14 ENCOUNTER — Encounter (HOSPITAL_BASED_OUTPATIENT_CLINIC_OR_DEPARTMENT_OTHER): Payer: Self-pay | Admitting: Cardiology

## 2021-09-14 VITALS — BP 138/50 | HR 69 | Ht 66.0 in | Wt 156.7 lb

## 2021-09-14 DIAGNOSIS — I4821 Permanent atrial fibrillation: Secondary | ICD-10-CM | POA: Diagnosis not present

## 2021-09-14 DIAGNOSIS — I10 Essential (primary) hypertension: Secondary | ICD-10-CM

## 2021-09-14 DIAGNOSIS — I350 Nonrheumatic aortic (valve) stenosis: Secondary | ICD-10-CM

## 2021-09-14 DIAGNOSIS — Z8673 Personal history of transient ischemic attack (TIA), and cerebral infarction without residual deficits: Secondary | ICD-10-CM

## 2021-09-14 NOTE — Progress Notes (Signed)
Cardiology Office Note:    Date:  09/14/2021   ID:  Erika [name], DOB Feb 13, 1932, MRN [id number]  PCP:  Leonides Sake, MD  Cardiologist:  Buford Dresser, MD PhD  Referring MD: Leonides Sake, MD   CC: follow up  History of Present Illness:    Erika [name] is a 88 y.o. female with a hx of hypertension, permanent atrial fibrillation on anticoagulation with apixaban, hypothyroidism, obesity, hyperlipidemia, TIA who is seen in follow up. She is previously a Erika [name] patient.  Per Dr. Thurman Coyer note, had history of esophageal perforation with TEE in 2011. Had cardioversion in 2009 and 2011, cardiolite 2011, echocardiogram 2011 with LVEF 50%.   At her last appointment she reported at home blood pressures with a range from 126/53 to 150/52, with symptoms of shakiness. Highest BP 179/66, nothing <120/50. Her daughter noted ongoing bilateral LE edema. Her PCP increased Lasix to 40 mg but they did not notice much difference. She still had intermittent sensations described as "her head doesn't feel right." This usually began in the mornings. She was instructed it was ok to use 10 mg PRN hydralazine for elevated systolic blood pressures.  Her daughter messaged the office 03/14/2021 with worse facial swelling than usual. She saw her PCP who felt it was a side effect of lisinopril which was stopped.  Today: She is accompanied by her daughter who notes that Erika [name]'s cough has been almost constant. Overall she feels terrible. She denies any fevers or chills. Recently she has not slept well due to the cough.   At home her blood pressures have usually ranged in the 130's-150's. In clinic today her blood pressure is 138/50. She did take her antihypertensives about 1.5 hours ago.   Her daughter notes that she is having more difficulty with walking due to her arthritis. She uses her walker for stability.  She denies any palpitations, chest pain, shortness of breath, or peripheral edema. No  lightheadedness, headaches, syncope, orthopnea, or PND.   Past Medical History:  Diagnosis Date   Abdominal pain    Anemia    ANEMIA 06/15/2009   Qualifier: Diagnosis of  By: Selena Batten CMA, Jewel     Aortic stenosis    moderate AS by 06/2011 echo   Arthritis    Atrial fibrillation ([place]) 06/15/2009   Qualifier: Diagnosis of  By: Selena Batten CMA, Jewel     Cardiac arrhythmia    Gallstones, probable biliary colic 0/01/7508   GERD (gastroesophageal reflux disease)    H/O hiatal hernia    Hyperlipidemia    HYPERLIPIDEMIA 06/15/2009   Qualifier: Diagnosis of  By: Selena Batten CMA, Jewel     Hypertension    HYPERTENSION 06/15/2009   Qualifier: Diagnosis of  By: Selena Batten CMA, Jewel     Hyperthyroidism    Memory disorder 04/04/2016   Obesity    OBESITY 06/15/2009   Qualifier: Diagnosis of  By: Selena Batten CMA, Jewel     Seasonal allergies    SLEEP APNEA 06/16/2009   Qualifier: Diagnosis of  By: Rayann Heman, MD, James     Unspecified hypothyroidism 06/15/2009   Qualifier: Diagnosis of  By: Selena Batten CMA, Jewel      Past Surgical History:  Procedure Laterality Date   BREAST SURGERY  2012 (June or July)   right   CHOLECYSTECTOMY  06/28/2011   Procedure: LAPAROSCOPIC CHOLECYSTECTOMY WITH INTRAOPERATIVE CHOLANGIOGRAM;  Surgeon: Merrie Roof, MD;  Location: [facility];  Service: General;  Laterality: N/A;   pharyngeal repair  [phone]  Dr Constance Holster for post-TEE pharygeal tear w neck abscess I&D   THYROIDECTOMY  1961   80% removed    Current Medications: Current Outpatient Medications on File Prior to Visit  Medication Sig   albuterol (VENTOLIN HFA) 108 (90 Base) MCG/ACT inhaler Inhale 2 puffs into the lungs as needed.   amLODipine (NORVASC) 10 MG tablet TAKE 1 TABLET BY MOUTH EVERY DAY   buPROPion (WELLBUTRIN XL) 150 MG 24 hr tablet Take 150 mg by mouth every morning.   desloratadine (CLARINEX) 5 MG tablet Take 5 mg by mouth in the morning.   ELIQUIS 5 MG TABS tablet TAKE 1 TABLET BY MOUTH TWICE A DAY (Patient taking differently:  Take 5 mg by mouth 2 (two) times daily.)   ergocalciferol (VITAMIN D2) 50000 UNITS capsule Take 50,000 Units by mouth every Thursday.    famotidine (PEPCID) 40 MG tablet Take 40 mg by mouth daily.   furosemide (LASIX) 40 MG tablet Take 20 mg by mouth daily.   hydrALAZINE (APRESOLINE) 50 MG tablet Take 1.5 tablets by mouth in the morning and at bedtime.   HYDROcodone-acetaminophen (NORCO) 10-325 MG tablet Take 0.5-1 tablets by mouth 2 (two) times daily as needed (for pain).    hydrOXYzine (ATARAX/VISTARIL) 10 MG tablet Take 20 mg by mouth daily as needed.   ofloxacin (OCUFLOX) 0.3 % ophthalmic solution Place 1 drop into both eyes 2 (two) times daily as needed.   SYNTHROID 137 MCG tablet Take 137 mcg by mouth daily.   No current facility-administered medications on file prior to visit.     Allergies:   Celecoxib, Amiodarone, Dronedarone hydrochloride, Ezetimibe, Flecainide acetate, Lisinopril, Namenda [memantine hcl], and Sulfonamide derivatives   Social History   Tobacco Use   Smoking status: Never   Smokeless tobacco: Never  Vaping Use   Vaping Use: Never used  Substance Use Topics   Alcohol use: No   Drug use: No    Family History: The patient's family history includes Cancer in her maternal aunt; Heart disease in her father and mother. Brother with CAD, another brother with CAD and hyperlipidemia, another brother with CAD. Both father and mother deceased, both had CAD. Sister has cancer and CAD.  ROS:   Please see the history of present illness.   (+) Cough (+) Arthralgias, bilateral knee pain Additional pertinent ROS negative except as documented.  EKGs/Labs/Other Studies Reviewed:    The following studies were reviewed today: Notes/studies from Erika [name] previously  Echo 10/28/2019: 1. Left ventricular ejection fraction, by estimation, is 60 to 65%. The  left ventricle has normal function. The left ventricle has no regional  wall motion abnormalities. Left ventricular  diastolic function could not  be evaluated.   2. Right ventricular systolic function is low normal. The right  ventricular size is normal. There is severely elevated pulmonary artery  systolic pressure.   3. Left atrial size was severely dilated.   4. Right atrial size was severely dilated.   5. The mitral valve is normal in structure. Mild mitral valve  regurgitation. No evidence of mitral stenosis. Severe mitral annular  calcification.   6. Tricuspid valve regurgitation is mild to moderate.   7. The aortic valve is tricuspid. There is moderate calcification of the  aortic valve. There is mild thickening of the aortic valve. Aortic valve  regurgitation is trivial. Mild to moderate aortic valve stenosis.   8. The inferior vena cava is dilated in size with >50% respiratory  variability, suggesting right atrial pressure of 8  mmHg.   Comparison(s): No significant change from prior study.   Conclusion(s)/Recommendation(s): Otherwise normal echocardiogram, with minor abnormalities described in the report.   EKG:  EKG is personally reviewed.   09/14/2021:  not ordered today 02/20/2021: atrial fib at 63 bpm 08/15/2020: Atrial fibrillation at 60 bpm 03/22/20: atrial fibrillation with slow ventricular response, heart rate 59 bpm  Recent Labs: No results found for requested labs within last 365 days.   Recent Lipid Panel    Component Value Date/Time   CHOL 303 (H) 06/23/2011 0635   TRIG 61 06/23/2011 0635   HDL 52 06/23/2011 0635   CHOLHDL 5.8 06/23/2011 0635   VLDL 12 06/23/2011 0635   LDLCALC 239 (H) 06/23/2011 0635    Physical Exam:    VS:  BP (!) 138/50 (BP Location: Right Arm, Patient Position: Sitting, Cuff Size: Normal)   Pulse 69   Ht '5\' 6"'$  (1.676 m)   Wt 156 lb 11.2 oz (71.1 kg)   SpO2 95%   BMI 25.29 kg/m     Wt Readings from Last 3 Encounters:  09/14/21 156 lb 11.2 oz (71.1 kg)  02/20/21 161 lb (73 kg)  08/15/20 153 lb (69.4 kg)    GEN: Well nourished, well  developed in no acute distress; In wheelchair HEENT: Normal, moist mucous membranes NECK: No JVD CARDIAC: slightly irregular rhythm, normal S1 and S2, no rubs or gallops. 2/6 systolic murmur. Early peaking with preserved S2. VASCULAR: Radial and DP pulses 2+ bilaterally. No carotid bruits RESPIRATORY:  Clear to auscultation without rales, wheezing or rhonchi  ABDOMEN: Soft, non-tender, non-distended MUSCULOSKELETAL: moves all 4 limbs independently, in wheelchair SKIN: Warm and dry, no LE edema NEUROLOGIC:  No focal neuro deficits noted. Mildly hard of hearing PSYCHIATRIC:  Normal affect   ASSESSMENT:    1. Essential hypertension   2. Nonrheumatic aortic valve stenosis   3. History of TIA (transient ischemic attack)   4. Permanent atrial fibrillation (H[facility])     PLAN:    hypertension:  -has been difficult to manage, balance between elevated systolic pressures and low diastolic numbers -continue amlodipine 10 mg daily. No pitting edema on exam -had to stop carvedilol due to bradycardia. -stopped lisinopril due to facial swelling -continue furosemide 20 mg daily (no change on 40 mg dose) -unable to start spironolactone previously due to hyperkalemia -continue hydralazine 75 mg BID -ok to use 10 mg PRN hydralazine for elevated systolic blood pressures -continue taking daily blood pressures. -counseled on red flag signs that need immediate medical attention    Atrial fibrillation, permanent: history of TIA -CHA2DS2/VAS Stroke Risk Points  = 6 (HTN, age, TIA, gender) -tolerating anticoagulation, on apixaban 5 mg BID.  -rate controlled on no AV nodal agents, was bradycardic with low dose carvedilol -discussed that if she falls and hits her head, she should present to ER for CT scan to check for bleeding  Murmur: consistent with aortic stenosis murmur. Early peaking.  -last echo 2013 with moderate stenosis -asymptomatic -repeat echo for worsening symptoms -would be high risk TEE for  TAVR given prior perforation   Plan for follow up: 6 mos or sooner as needed.  Medication Adjustments/Labs and Tests Ordered: Current medicines are reviewed at length with the patient today.  Concerns regarding medicines are outlined above.   No orders of the defined types were placed in this encounter.  No orders of the defined types were placed in this encounter.  Patient Instructions  Medication Instructions:  Your Physician recommend you continue  on your current medication as directed.    *If you need a refill on your cardiac medications before your next appointment, please call your pharmacy*   Lab Work: None ordered today   Testing/Procedures: None ordered today   Follow-Up: At [facility], you and your health needs are our priority.  As part of our continuing mission to provide you with exceptional heart care, we have created designated Provider Care Teams.  These Care Teams include your primary Cardiologist (physician) and Advanced Practice Providers (APPs -  Physician Assistants and Nurse Practitioners) who all work together to provide you with the care you need, when you need it.  We recommend signing up for the patient portal called "MyChart".  Sign up information is provided on this After Visit Summary.  MyChart is used to connect with patients for Virtual Visits (Telemedicine).  Patients are able to view lab/test results, encounter notes, upcoming appointments, etc.  Non-urgent messages can be sent to your provider as well.   To learn more about what you can do with MyChart, go to [email or web address].    Your next appointment:   6 month(s)  The format for your next appointment:   In Person  Provider:   Buford Dresser, MD            [facility] Stumpf,acting as a scribe for Buford Dresser, MD.,have documented all relevant documentation on the behalf of Buford Dresser, MD,as directed by  Buford Dresser, MD while in the  presence of Buford Dresser, MD.  I, Buford Dresser, MD, have reviewed all documentation for this visit. The documentation on 09/14/21 for the exam, diagnosis, procedures, and orders are all accurate and complete.   Signed, Buford Dresser, MD PhD 09/14/2021  [place]

## 2021-09-14 NOTE — Patient Instructions (Signed)

## 2021-11-16 ENCOUNTER — Other Ambulatory Visit: Payer: Self-pay | Admitting: Cardiology

## 2021-11-16 DIAGNOSIS — I10 Essential (primary) hypertension: Secondary | ICD-10-CM

## 2022-03-16 NOTE — Progress Notes (Incomplete)
Cardiology Office Note:    Date:  03/16/2022   ID:  Erika [name], DOB 16-Oct-1932, MRN [id number]  PCP:  Leonides Sake, MD  Cardiologist:  Buford Dresser, MD PhD  Referring MD: Leonides Sake, MD   CC: follow up  History of Present Illness:    Erika [name] is a 89 y.o. female with a hx of hypertension, permanent atrial fibrillation on anticoagulation with apixaban, hypothyroidism, obesity, hyperlipidemia, TIA who is seen in follow up. She is previously a Dr. Wynonia Lawman patient.  Per Dr. Thurman Coyer note, had history of esophageal perforation with TEE in 2011. Had cardioversion in 2009 and 2011, cardiolite 2011, echocardiogram 2011 with LVEF 50%.   She reported at home blood pressures with a range from 126/53 to 150/52, with symptoms of shakiness. Highest BP 179/66, nothing <120/50. Her daughter noted ongoing bilateral LE edema. Her PCP increased Lasix to 40 mg but they did not notice much difference. She still had intermittent sensations described as "her head doesn't feel right." This usually began in the mornings. She was instructed it was ok to use 10 mg PRN hydralazine for elevated systolic blood pressures. Her daughter messaged the office 03/14/2021 with worse facial swelling than usual. She saw her PCP who felt it was a side effect of lisinopril which was stopped.  At her last appointment, she complained of an almost constant cough. Her home blood pressures ranged in the 130's-150's. She was having more difficulty walking due to her arthritis; used a walker for stability.  Today, she is accompanied by her daughter.  She denies any palpitations, chest pain, shortness of breath, or peripheral edema. No lightheadedness, headaches, syncope, orthopnea, or PND.  (+)   Past Medical History:  Diagnosis Date   Abdominal pain    Anemia    ANEMIA 06/15/2009   Qualifier: Diagnosis of  By: Selena Batten CMA, Jewel     Aortic stenosis    moderate AS by 06/2011 echo   Arthritis    Atrial  fibrillation ([place]) 06/15/2009   Qualifier: Diagnosis of  By: Selena Batten CMA, Jewel     Cardiac arrhythmia    Gallstones, probable biliary colic [id number]   GERD (gastroesophageal reflux disease)    H/O hiatal hernia    Hyperlipidemia    HYPERLIPIDEMIA 06/15/2009   Qualifier: Diagnosis of  By: Selena Batten CMA, Jewel     Hypertension    HYPERTENSION 06/15/2009   Qualifier: Diagnosis of  By: Selena Batten CMA, Jewel     Hyperthyroidism    Memory disorder 04/04/2016   Obesity    OBESITY 06/15/2009   Qualifier: Diagnosis of  By: Selena Batten CMA, Jewel     Seasonal allergies    SLEEP APNEA 06/16/2009   Qualifier: Diagnosis of  By: Rayann Heman, MD, James     Unspecified hypothyroidism 06/15/2009   Qualifier: Diagnosis of  By: Selena Batten CMA, Jewel      Past Surgical History:  Procedure Laterality Date   BREAST SURGERY  2012 (June or July)   right   CHOLECYSTECTOMY  06/28/2011   Procedure: LAPAROSCOPIC CHOLECYSTECTOMY WITH INTRAOPERATIVE CHOLANGIOGRAM;  Surgeon: Merrie Roof, MD;  Location: [facility];  Service: General;  Laterality: N/A;   pharyngeal repair  [id number]   Dr Constance Holster for post-TEE pharygeal tear w neck abscess I&D   THYROIDECTOMY  1961   80% removed    Current Medications: Current Outpatient Medications on File Prior to Visit  Medication Sig   albuterol (VENTOLIN HFA) 108 (90 Base) MCG/ACT inhaler Inhale 2 puffs into the  lungs as needed.   amLODipine (NORVASC) 10 MG tablet TAKE 1 TABLET BY MOUTH EVERY DAY   buPROPion (WELLBUTRIN XL) 150 MG 24 hr tablet Take 150 mg by mouth every morning.   desloratadine (CLARINEX) 5 MG tablet Take 5 mg by mouth in the morning.   ELIQUIS 5 MG TABS tablet TAKE 1 TABLET BY MOUTH TWICE A DAY (Patient taking differently: Take 5 mg by mouth 2 (two) times daily.)   ergocalciferol (VITAMIN D2) 50000 UNITS capsule Take 50,000 Units by mouth every Thursday.    famotidine (PEPCID) 40 MG tablet Take 40 mg by mouth daily.   furosemide (LASIX) 40 MG tablet Take 20 mg by mouth daily.   hydrALAZINE  (APRESOLINE) 50 MG tablet TAKE 1 TABLET BY MOUTH THREE TIMES A DAY   HYDROcodone-acetaminophen (NORCO) 10-325 MG tablet Take 0.5-1 tablets by mouth 2 (two) times daily as needed (for pain).    hydrOXYzine (ATARAX/VISTARIL) 10 MG tablet Take 20 mg by mouth daily as needed.   ofloxacin (OCUFLOX) 0.3 % ophthalmic solution Place 1 drop into both eyes 2 (two) times daily as needed.   SYNTHROID 137 MCG tablet Take 137 mcg by mouth daily.   No current facility-administered medications on file prior to visit.     Allergies:   Celecoxib, Amiodarone, Dronedarone hydrochloride, Ezetimibe, Flecainide acetate, Lisinopril, Namenda [memantine hcl], and Sulfonamide derivatives   Social History   Tobacco Use   Smoking status: Never   Smokeless tobacco: Never  Vaping Use   Vaping Use: Never used  Substance Use Topics   Alcohol use: No   Drug use: No    Family History: The patient's family history includes Cancer in her maternal aunt; Heart disease in her father and mother. Brother with CAD, another brother with CAD and hyperlipidemia, another brother with CAD. Both father and mother deceased, both had CAD. Sister has cancer and CAD.  ROS:   Please see the history of present illness.    Additional pertinent ROS negative except as documented.  EKGs/Labs/Other Studies Reviewed:    The following studies were reviewed today: Notes/studies from Dr. Wynonia Lawman previously  Echo 10/28/2019: 1. Left ventricular ejection fraction, by estimation, is 60 to 65%. The  left ventricle has normal function. The left ventricle has no regional  wall motion abnormalities. Left ventricular diastolic function could not  be evaluated.   2. Right ventricular systolic function is low normal. The right  ventricular size is normal. There is severely elevated pulmonary artery  systolic pressure.   3. Left atrial size was severely dilated.   4. Right atrial size was severely dilated.   5. The mitral valve is normal in  structure. Mild mitral valve  regurgitation. No evidence of mitral stenosis. Severe mitral annular  calcification.   6. Tricuspid valve regurgitation is mild to moderate.   7. The aortic valve is tricuspid. There is moderate calcification of the  aortic valve. There is mild thickening of the aortic valve. Aortic valve  regurgitation is trivial. Mild to moderate aortic valve stenosis.   8. The inferior vena cava is dilated in size with >50% respiratory  variability, suggesting right atrial pressure of 8 mmHg.   Comparison(s): No significant change from prior study.   Conclusion(s)/Recommendation(s): Otherwise normal echocardiogram, with minor abnormalities described in the report.   EKG:  EKG is personally reviewed.   03/19/2022:  *** 09/14/2021:  not ordered 02/20/2021: atrial fib at 63 bpm 08/15/2020: Atrial fibrillation at 60 bpm 03/22/20: atrial fibrillation with slow ventricular  response, heart rate 59 bpm  Recent Labs: No results found for requested labs within last 365 days.   Recent Lipid Panel    Component Value Date/Time   CHOL 303 (H) 06/23/2011 0635   TRIG 61 06/23/2011 0635   HDL 52 06/23/2011 0635   CHOLHDL 5.8 06/23/2011 0635   VLDL 12 06/23/2011 0635   LDLCALC 239 (H) 06/23/2011 [id number]    Physical Exam:    VS:  There were no vitals taken for this visit.    Wt Readings from Last 3 Encounters:  09/14/21 156 lb 11.2 oz (71.1 kg)  02/20/21 161 lb (73 kg)  08/15/20 153 lb (69.4 kg)    GEN: Well nourished, well developed in no acute distress HEENT: Normal, moist mucous membranes NECK: No JVD CARDIAC: ***slightly irregular rhythm, normal S1 and S2, no rubs or gallops. ***2/6 systolic murmur. Early peaking with preserved S2. VASCULAR: Radial and DP pulses 2+ bilaterally. No carotid bruits RESPIRATORY:  Clear to auscultation without rales, wheezing or rhonchi  ABDOMEN: Soft, non-tender, non-distended MUSCULOSKELETAL: moves all 4 limbs independently, ***in  wheelchair SKIN: Warm and dry, no LE edema NEUROLOGIC:  No focal neuro deficits noted. Mildly hard of hearing PSYCHIATRIC:  Normal affect   ASSESSMENT:    No diagnosis found.  PLAN:    hypertension:  -has been difficult to manage, balance between elevated systolic pressures and low diastolic numbers -continue amlodipine 10 mg daily. No pitting edema on exam -had to stop carvedilol due to bradycardia. -stopped lisinopril due to facial swelling -continue furosemide 20 mg daily (no change on 40 mg dose) -unable to start spironolactone previously due to hyperkalemia -continue hydralazine 75 mg BID -ok to use 10 mg PRN hydralazine for elevated systolic blood pressures -continue taking daily blood pressures. -counseled on red flag signs that need immediate medical attention    Atrial fibrillation, permanent: history of TIA -CHA2DS2/VAS Stroke Risk Points  = 6 (HTN, age, TIA, gender) -tolerating anticoagulation, on apixaban 5 mg BID.  -rate controlled on no AV nodal agents, was bradycardic with low dose carvedilol -discussed that if she falls and hits her head, she should present to ER for CT scan to check for bleeding  Murmur: consistent with aortic stenosis murmur. Early peaking.  -last echo 2013 with moderate stenosis -asymptomatic -repeat echo for worsening symptoms -would be high risk TEE for TAVR given prior perforation   Plan for follow up: ***6 mos or sooner as needed.  Medication Adjustments/Labs and Tests Ordered: Current medicines are reviewed at length with the patient today.  Concerns regarding medicines are outlined above.   No orders of the defined types were placed in this encounter.  No orders of the defined types were placed in this encounter.  There are no Patient Instructions on file for this visit.   I,Mathew Stumpf,acting as a Education administrator for [employer], MD.,have documented all relevant documentation on the behalf of Buford Dresser, MD,as  directed by  Buford Dresser, MD while in the presence of Buford Dresser, MD.  I, Madelin Rear, have reviewed all documentation for this visit. The documentation on 03/16/22 for the exam, diagnosis, procedures, and orders are all accurate and complete.   Signed, Buford Dresser, MD PhD 03/16/2022  [place]

## 2022-03-19 ENCOUNTER — Ambulatory Visit (HOSPITAL_BASED_OUTPATIENT_CLINIC_OR_DEPARTMENT_OTHER): Payer: Medicare Other | Admitting: Cardiology

## 2022-04-15 ENCOUNTER — Other Ambulatory Visit: Payer: Self-pay | Admitting: Cardiology

## 2022-04-15 DIAGNOSIS — I10 Essential (primary) hypertension: Secondary | ICD-10-CM

## 2022-05-08 ENCOUNTER — Other Ambulatory Visit: Payer: Self-pay | Admitting: Cardiology

## 2022-05-08 DIAGNOSIS — I10 Essential (primary) hypertension: Secondary | ICD-10-CM

## 2022-05-22 ENCOUNTER — Telehealth: Payer: Self-pay | Admitting: Cardiology

## 2022-05-22 MED ORDER — HYDRALAZINE HCL 50 MG PO TABS: 75.0000 mg | ORAL_TABLET | Freq: Two times a day (BID) | ORAL | 1 refills | Status: DC

## 2022-05-22 NOTE — Telephone Encounter (Signed)
*  STAT* If patient is at the pharmacy, call can be transferred to refill team.   1. Which medications need to be refilled? (please list name of each medication and dose if known)   hydrALAZINE (APRESOLINE) 50 MG tablet    2. Which pharmacy/location (including street and city if local pharmacy) is medication to be sent to? [id number] - [place], [place] - [address]   3. Do they need a 30 day or 90 day supply? 90 day

## 2022-05-22 NOTE — Telephone Encounter (Signed)
Per Dr. Di Kindle note, pt should be taking Hydralazine 1.5 tab (75 mg) twice daily. Unable to see when this was changed back to 50 mg TID.  Spoke to pt daughter, Thedora Hinders (ok per DPR), who stated pt is taking Hydralazine 75 mg BID. Informed her I would update this on pt chart and send in to pharmacy. Pt daughter verbalized thanks for the call.

## 2022-05-29 ENCOUNTER — Telehealth (HOSPITAL_BASED_OUTPATIENT_CLINIC_OR_DEPARTMENT_OTHER): Payer: Self-pay | Admitting: Cardiology

## 2022-05-29 DIAGNOSIS — I10 Essential (primary) hypertension: Secondary | ICD-10-CM

## 2022-05-29 MED ORDER — HYDRALAZINE HCL 25 MG PO TABS: 25.0000 mg | ORAL_TABLET | Freq: Two times a day (BID) | ORAL | 3 refills | Status: DC

## 2022-05-29 MED ORDER — HYDRALAZINE HCL 25 MG PO TABS: 75.0000 mg | ORAL_TABLET | Freq: Two times a day (BID) | ORAL | 3 refills | Status: DC

## 2022-05-29 NOTE — Telephone Encounter (Signed)
Pt c/o medication issue:  1. Name of Medication: hydrALAZINE (APRESOLINE) 50 MG tablet   2. How are you currently taking this medication (dosage and times per day)?   Take 1.5 tablets (75 mg total) by mouth 2 (two) times daily.      3. Are you having a reaction (difficulty breathing--STAT)? no  4. What is your medication issue? Patient daughter called stating it's a different manufacturer the new script she has now of the medication and she can't cut the pill in half now it just crumbles the pill.  She wants to know if a script for 25 mg call be called in.  She her mother take 1.5 tablet of the 50mg .

## 2022-05-29 NOTE — Addendum Note (Signed)
Addended by: Marlene Lard on: 05/29/2022 10:42 AM   Modules accepted: Orders

## 2022-08-08 ENCOUNTER — Ambulatory Visit (INDEPENDENT_AMBULATORY_CARE_PROVIDER_SITE_OTHER): Payer: Medicare Other | Admitting: Cardiology

## 2022-08-08 ENCOUNTER — Encounter (HOSPITAL_BASED_OUTPATIENT_CLINIC_OR_DEPARTMENT_OTHER): Payer: Self-pay | Admitting: Cardiology

## 2022-08-08 VITALS — BP 128/52 | HR 65 | Ht 66.0 in | Wt 155.3 lb

## 2022-08-08 DIAGNOSIS — I10 Essential (primary) hypertension: Secondary | ICD-10-CM | POA: Diagnosis not present

## 2022-08-08 DIAGNOSIS — I4821 Permanent atrial fibrillation: Secondary | ICD-10-CM | POA: Diagnosis not present

## 2022-08-08 DIAGNOSIS — I350 Nonrheumatic aortic (valve) stenosis: Secondary | ICD-10-CM

## 2022-08-08 DIAGNOSIS — D6869 Other thrombophilia: Secondary | ICD-10-CM

## 2022-08-08 DIAGNOSIS — Z7901 Long term (current) use of anticoagulants: Secondary | ICD-10-CM | POA: Diagnosis not present

## 2022-08-08 NOTE — Progress Notes (Signed)
Cardiology Office Note:  .    Date:  08/08/2022  ID:  Erika [name], DOB [birth year], MRN [id number] PCP: Ailene Ravel, MD  [facility] Providers Cardiologist:  Jodelle Red, MD     History of Present Illness: .    Erika [name] is a 89 y.o. female with a hx of hypertension, permanent atrial fibrillation on anticoagulation with apixaban, hypothyroidism, obesity, hyperlipidemia, TIA who is seen in follow up. She is previously a Dr. Donnie Aho patient.   Per Dr. York Spaniel note, had history of esophageal perforation with TEE in 2011. Had cardioversion in 2009 and 2011, cardiolite 2011, echocardiogram 2011 with LVEF 50%.    In 02/2021, she reported at home blood pressures with a range from 126/53 to 150/52, with symptoms of shakiness. Highest BP 179/66, nothing <120/50. Her daughter noted ongoing bilateral LE edema. Her PCP increased Lasix to 40 mg but they did not notice much difference. She still had intermittent sensations described as "her head doesn't feel right." This usually began in the mornings. She was instructed it was ok to use 10 mg PRN hydralazine for elevated systolic blood pressures.   Her daughter messaged the office 03/14/2021 with worse facial swelling than usual. She saw her PCP who felt it was a side effect of lisinopril which was stopped.   At her visit 09/2021, she complained of a constant cough and was feeling terrible. Was having more difficulty walking due to her arthritis; used a walker. Home blood pressures ranged in the 130's-150's. Okay to use 10 mg PRN hydralazine for elevated systolic blood pressures.    Today, she is accompanied by her daughter. She states she is feeling well today. However, she continues to struggle with easily becoming exhausted. No falls in a while.   She has some swelling in her legs, usually more in her left leg.  No bleeding issues. No hematuria or hematochezia.  She denies any palpitations, chest pain, shortness of breath,  lightheadedness, headaches, syncope, orthopnea, or PND.  ROS:  Please see the history of present illness. ROS otherwise negative except as noted.  (+) Fatigue (+) BLE edema, L>R  Studies Reviewed: Marland Kitchen    EKG Interpretation Date/Time:  Wednesday August 08 2022 10:27:37 EDT Ventricular Rate:  65 PR Interval:    QRS Duration:  80 QT Interval:  416 QTC Calculation: 432 R Axis:   62  Text Interpretation: Atrial fibrillation Anteroseptal infarct (cited on or before 29-Oct-2019) Confirmed by Jodelle Red [phone]) on 08/08/2022 10:52:40 AM    Physical Exam:    VS:  BP (!) 128/52 (BP Location: Left Arm, Patient Position: Sitting, Cuff Size: Normal)   Pulse 65   Ht 5\' 6"  (1.676 m)   Wt 155 lb 4.8 oz (70.4 kg)   BMI 25.07 kg/m    Wt Readings from Last 3 Encounters:  08/08/22 155 lb 4.8 oz (70.4 kg)  09/14/21 156 lb 11.2 oz (71.1 kg)  02/20/21 161 lb (73 kg)    GEN: Well nourished, well developed in no acute distress. In wheelchair. HEENT: Normal, moist mucous membranes NECK: No JVD CARDIAC: regular rhythm, normal S1 and S2, no rubs or gallops. 2/6 systolic murmur, Early peaking with preserved S2.  VASCULAR: Radial and DP pulses 2+ bilaterally. No carotid bruits RESPIRATORY:  Clear to auscultation without rales, wheezing or rhonchi  ABDOMEN: Soft, non-tender, non-distended MUSCULOSKELETAL:  Ambulates independently SKIN: Warm and dry, trivial nonpitting LE edema L>R NEUROLOGIC:  Alert and oriented x 3. No focal neuro deficits noted. PSYCHIATRIC:  Normal affect   ASSESSMENT AND PLAN: .    hypertension:  -has been difficult to manage, balance between elevated systolic pressures and low diastolic numbers -continue amlodipine 10 mg daily. No pitting edema on exam -had to stop carvedilol due to bradycardia. -stopped lisinopril due to facial swelling -continue furosemide 20 mg daily (no change on 40 mg dose) -unable to start spironolactone previously due to  hyperkalemia -continue hydralazine 75 mg BID -ok to use 10 mg PRN hydralazine for elevated systolic blood pressures -continue taking daily blood pressures. -counseled on red flag signs that need immediate medical attention    Atrial fibrillation, permanent: history of TIA -CHA2DS2/VAS Stroke Risk Points  = 6 (HTN, age, TIA, gender) -tolerating anticoagulation, on apixaban 5 mg BID.  -rate controlled on no AV nodal agents, was bradycardic with low dose carvedilol -discussed that if she falls and hits her head, she should present to ER for CT scan to check for bleeding   Murmur: consistent with aortic stenosis murmur. Early peaking.  -last echo 2021 with mild-moderate stenosis -asymptomatic -repeat echo for worsening symptoms -would be high risk TEE for TAVR given prior perforation  Dispo: Follow-up in 9 months, or sooner as needed.  I,Mathew Stumpf,acting as a Neurosurgeon for [employer], MD.,have documented all relevant documentation on the behalf of Jodelle Red, MD,as directed by  Jodelle Red, MD while in the presence of Jodelle Red, MD.  I, Jodelle Red, MD, have reviewed all documentation for this visit. The documentation on 08/08/22 for the exam, diagnosis, procedures, and orders are all accurate and complete.   Signed, Jodelle Red, MD

## 2022-08-08 NOTE — Patient Instructions (Signed)
Medication Instructions:  Your physician recommends that you continue on your current medications as directed. Please refer to the Current Medication list given to you today.  *If you need a refill on your cardiac medications before your next appointment, please call your pharmacy*  Lab Work: NONE  Testing/Procedures: NONE  Follow-Up: At [facility], you and your health needs are our priority.  As part of our continuing mission to provide you with exceptional heart care, we have created designated Provider Care Teams.  These Care Teams include your primary Cardiologist (physician) and Advanced Practice Providers (APPs -  Physician Assistants and Nurse Practitioners) who all work together to provide you with the care you need, when you need it.  We recommend signing up for the patient portal called "MyChart".  Sign up information is provided on this After Visit Summary.  MyChart is used to connect with patients for Virtual Visits (Telemedicine).  Patients are able to view lab/test results, encounter notes, upcoming appointments, etc.  Non-urgent messages can be sent to your provider as well.   To learn more about what you can do with MyChart, go to [email or web address].    Your next appointment:   9 month(s)  The format for your next appointment:   In Person  Provider:   Jodelle Red, MD or Ronn Melena NP

## 2022-08-20 ENCOUNTER — Telehealth: Payer: Self-pay

## 2022-08-20 NOTE — Patient Outreach (Signed)
  Care Coordination   08/20/2022 Name: Erika [name] MRN: [id number] DOB: May 30, 1932   Care Coordination Outreach Attempts:  An unsuccessful telephone outreach was attempted today to offer the patient information about available care coordination services.  Follow Up Plan:  Additional outreach attempts will be made to offer the patient care coordination information and services.   Encounter Outcome:  No Answer   Care Coordination Interventions:  No, not indicated    Rowe Pavy, RN, BSN, [facility] [employer] [phone]

## 2022-08-21 ENCOUNTER — Telehealth: Payer: Self-pay

## 2022-08-21 NOTE — Patient Outreach (Signed)
  Care Coordination   08/21/2022 Name: Erika [name] MRN: [id number] DOB: 11-Nov-1932   Care Coordination Outreach Attempts:  A second unsuccessful outreach was attempted today to offer the patient with information about available care coordination services.  Follow Up Plan:  Additional outreach attempts will be made to offer the patient care coordination information and services.   Encounter Outcome:  No Answer   Care Coordination Interventions:  No, not indicated    Rowe Pavy, RN, BSN, [facility] [employer] [phone]

## 2022-08-23 ENCOUNTER — Telehealth: Payer: Self-pay

## 2022-08-23 NOTE — Patient Outreach (Signed)
  Care Coordination   08/23/2022 Name: Erika [name] MRN: [id number] DOB: [birth year]   Care Coordination Outreach Attempts:  A third unsuccessful outreach was attempted today to offer the patient with information about available care coordination services.  Follow Up Plan:  No further outreach attempts will be made at this time. We have been unable to contact the patient to offer or enroll patient in care coordination services  Encounter Outcome:  No Answer   Care Coordination Interventions:  No, not indicated    Rowe Pavy, RN, BSN, CEN [facility] Coordinator [phone]

## 2023-04-06 ENCOUNTER — Other Ambulatory Visit (HOSPITAL_BASED_OUTPATIENT_CLINIC_OR_DEPARTMENT_OTHER): Payer: Self-pay | Admitting: Cardiovascular Disease

## 2023-04-06 DIAGNOSIS — I10 Essential (primary) hypertension: Secondary | ICD-10-CM

## 2023-08-12 ENCOUNTER — Encounter (HOSPITAL_BASED_OUTPATIENT_CLINIC_OR_DEPARTMENT_OTHER): Payer: Self-pay | Admitting: Emergency Medicine

## 2023-08-12 ENCOUNTER — Other Ambulatory Visit: Payer: Self-pay

## 2023-08-12 ENCOUNTER — Emergency Department (HOSPITAL_BASED_OUTPATIENT_CLINIC_OR_DEPARTMENT_OTHER)

## 2023-08-12 ENCOUNTER — Emergency Department (HOSPITAL_BASED_OUTPATIENT_CLINIC_OR_DEPARTMENT_OTHER)
Admission: EM | Admit: 2023-08-12 | Discharge: 2023-08-12 | Disposition: A | Discharge status: Home / Self Care | Attending: Emergency Medicine | Admitting: Emergency Medicine

## 2023-08-12 DIAGNOSIS — F0390 Unspecified dementia without behavioral disturbance: Secondary | ICD-10-CM | POA: Diagnosis not present

## 2023-08-12 DIAGNOSIS — I110 Hypertensive heart disease with heart failure: Secondary | ICD-10-CM | POA: Insufficient documentation

## 2023-08-12 DIAGNOSIS — Z7901 Long term (current) use of anticoagulants: Secondary | ICD-10-CM | POA: Diagnosis not present

## 2023-08-12 DIAGNOSIS — Z79899 Other long term (current) drug therapy: Secondary | ICD-10-CM | POA: Insufficient documentation

## 2023-08-12 DIAGNOSIS — E039 Hypothyroidism, unspecified: Secondary | ICD-10-CM | POA: Insufficient documentation

## 2023-08-12 DIAGNOSIS — M25551 Pain in right hip: Secondary | ICD-10-CM | POA: Insufficient documentation

## 2023-08-12 DIAGNOSIS — I509 Heart failure, unspecified: Secondary | ICD-10-CM | POA: Insufficient documentation

## 2023-08-12 DIAGNOSIS — Z79890 Hormone replacement therapy (postmenopausal): Secondary | ICD-10-CM | POA: Insufficient documentation

## 2023-08-12 DIAGNOSIS — R600 Localized edema: Secondary | ICD-10-CM | POA: Insufficient documentation

## 2023-08-12 NOTE — ED Triage Notes (Signed)
 Clemens last Thursday and now her hip is protruding  per her family , pt arrives in w/c , family states she pivots bu doesn't walk , pt is on eliquis  family states she did not hit head and didn't fall down just hit edge of w/c

## 2023-08-12 NOTE — ED Provider Notes (Signed)
 [place] [facility] Provider Note   CSN: [id number] Arrival date & time: 08/12/23  1211     Patient presents with: Erika [name] is a 89 y.o. female with past medical history of hypothyroidism, HTN, A-fib, gallstones, GERD, dementia, HF, hyponatremia presents emergency department for evaluation of right hip pain that started 4 days ago.  Caregiver reports that patient hit her right hip while trying to get into seat of wheelchair.  Normally needs assistance with standing, pivoting from wheelchair and has been doing this per her baseline since incident.  Daughter sought ED evaluation as she noted her right hip protruding when compared to left hip when bathing patient.    Fall      Prior to Admission medications   Medication Sig Start Date End Date Taking? Authorizing Provider  albuterol (VENTOLIN HFA) 108 (90 Base) MCG/ACT inhaler Inhale 2 puffs into the lungs as needed. 06/15/21   [provider]  amLODipine  (NORVASC ) 10 MG tablet TAKE 1 TABLET BY MOUTH EVERY DAY 03/21/20   Lonni Slain, MD  buPROPion (WELLBUTRIN XL) 150 MG 24 hr tablet Take 150 mg by mouth every morning. 03/20/20   [provider]  desloratadine (CLARINEX) 5 MG tablet Take 5 mg by mouth in the morning.    [provider]  ELIQUIS  5 MG TABS tablet TAKE 1 TABLET BY MOUTH TWICE A DAY Patient taking differently: Take 5 mg by mouth 2 (two) times daily. 05/25/19   Lonni Slain, MD  ergocalciferol (VITAMIN D2) 50000 UNITS capsule Take 50,000 Units by mouth every Thursday.     [provider]  famotidine (PEPCID) 40 MG tablet Take 40 mg by mouth daily. 08/08/21   [provider]  furosemide  (LASIX ) 40 MG tablet Take 20 mg by mouth daily.    [provider]  hydrALAZINE  (APRESOLINE ) 25 MG tablet TAKE 3 TABLETS BY MOUTH 2 TIMES DAILY. 04/08/23   Lonni Slain, MD  HYDROcodone -acetaminophen  (NORCO) 10-325 MG  tablet Take 0.5-1 tablets by mouth 2 (two) times daily as needed (for pain).  10/12/19   [provider]  hydrOXYzine (ATARAX/VISTARIL) 10 MG tablet Take 20 mg by mouth daily as needed. 02/24/20   [provider]  ofloxacin (OCUFLOX) 0.3 % ophthalmic solution Place 1 drop into both eyes 2 (two) times daily as needed. 06/12/21   [provider]  SYNTHROID  137 MCG tablet Take 137 mcg by mouth daily. 08/10/21   [provider]    Allergies: Celecoxib, Amiodarone, Dronedarone hydrochloride, Ezetimibe, Flecainide acetate, Lisinopril , Namenda  [memantine  hcl], and Sulfonamide derivatives    Review of Systems  Musculoskeletal:  Positive for arthralgias.    Updated Vital Signs BP (!) 164/48 (BP Location: Left Arm)   Pulse (!) 114   Temp 97.6 F (36.4 C) (Oral)   Resp 18   Ht 5' 6 (1.676 m)   Wt 31.9 kg   SpO2 98%   BMI 11.36 kg/m   Physical Exam Vitals and nursing note reviewed.  Constitutional:      General: She is not in acute distress.    Appearance: Normal appearance.  HENT:     Head: Normocephalic and atraumatic.  Eyes:     Conjunctiva/sclera: Conjunctivae normal.  Cardiovascular:     Rate and Rhythm: Normal rate.     Pulses:          Dorsalis pedis pulses are 2+ on the right side and 2+ on the left side.  Comments: LLE typically has edema vs right. No papable cords, erythema, warmth, pallor, nor tenderness along venous system. Homan sign negative x2 Pulmonary:     Effort: Pulmonary effort is normal. No respiratory distress.  Musculoskeletal:     Cervical back: No bony tenderness.     Thoracic back: No bony tenderness.     Lumbar back: No bony tenderness.     Right lower leg: No tenderness. No edema.     Left lower leg: No tenderness. 1+ Pitting Edema present.     Comments: Mild ttp of right iliac crest. Mild protrusion of right iliac crest when compared to left. No crepitus nor instability of pelvis. Able to WB equally and pivot per her  baseline  Skin:    Coloration: Skin is not jaundiced or pale.  Neurological:     Mental Status: She is alert. Mental status is at baseline.     Comments: Sensation 2/2 motor 5/5 of BLE     (all labs ordered are listed, but only abnormal results are displayed) Labs Reviewed - No data to display  EKG: None  Radiology: DG HIP UNILAT WITH PELVIS 2-3 VIEWS RIGHT Result Date: 08/12/2023 CLINICAL DATA:  Fall. EXAM: DG HIP (WITH OR WITHOUT PELVIS) 2-3V RIGHT COMPARISON:  None Available. FINDINGS: There is diffuse osteopenia of the visualized osseous structures. Pelvis is intact with normal and symmetric sacroiliac joints. No acute fracture or dislocation. No aggressive osseous lesion. Visualized sacral arcuate lines are unremarkable. There are changes of chronic pubic symphisitis. There are mild degenerative changes of bilateral hip joints without significant joint space narrowing. Osteophytosis of the superior acetabulum. No radiopaque foreign bodies. IMPRESSION: No acute osseous abnormality of the pelvis or right hip joint. Electronically Signed   By: Ree Molt M.D.   On: 08/12/2023 12:55     Medications Ordered in the ED - No data to display                                  Medical Decision Making Amount and/or Complexity of Data Reviewed Radiology: ordered.   Patient presents to the ED for concern of right hip pain, this involves an extensive number of treatment options, and is a complaint that carries with it a high risk of complications and morbidity.  The differential diagnosis includes fracture, contusion, dislocation, abrasion, laceration, hematoma   Co morbidities that complicate the patient evaluation  See HPI   Additional history obtained:  Additional history obtained from Family and Nursing   External records from outside source obtained and reviewed including triage RN note, daughter at bedside    Imaging Studies ordered:  I ordered imaging studies including  right pelvis XR  I independently visualized and interpreted imaging which showed  No acute osseous abnormality of the pelvis or right hip joint  I agree with the radiologist interpretation    Problem List / ED Course:  Right hip pain Neuro intact.  Motor and sensation WNL. Mildly tender right iliac crest pain with palpation.  Right hip does appear more protruded than left hip which could be secondary to generalized swelling, hematoma, baseline.  There is no no overlying ecchymosis.  Pelvis stable with no crepitus. X-ray negative for obvious fracture I am reassured that patient is able to pivot per her baseline and bear weight equally on both extremities.  She is not very tender and there is no instability. No other injuries reported by patient,  family nor found on physical exam Discussed symptomatic treatment at home   Reevaluation:  After the interventions noted above, I reevaluated the patient and found that they have :stayed the same   Social Determinants of Health:  Has pcp Lives with daughter and has caretaker   Dispostion:  After consideration of the diagnostic results and the patients response to treatment, I feel that the patent would benefit from outpatient management with PCP f/u.   Discussed ED workup, disposition, return to ED precautions with patient who expresses understanding agrees with plan.  All questions answered to their satisfaction.  They are agreeable to plan.  Discharge instructions provided on paperwork  Final diagnoses:  Right hip pain    ED Discharge Orders     None        Minnie Tinnie BRAVO, PA 08/12/23 1631    Darra Fonda MATSU, MD 08/15/23 1100

## 2023-08-12 NOTE — Discharge Instructions (Addendum)
 Thank you for letting us  evaluate you today.  Your x-ray of your pelvis was negative for obvious fracture.  You may take Tylenol , ibuprofen as needed for pain.  You may also use heat, ice for pain, swelling. F/u with PCP  Return to Emergency Department if you experience worsening pain, traumatic injury, inability to ambulate, loss of sensation in the legs

## 2023-08-16 ENCOUNTER — Emergency Department (HOSPITAL_BASED_OUTPATIENT_CLINIC_OR_DEPARTMENT_OTHER)
Admission: EM | Admit: 2023-08-16 | Discharge: 2023-08-16 | Disposition: A | Discharge status: Home / Self Care | Attending: Emergency Medicine | Admitting: Emergency Medicine

## 2023-08-16 ENCOUNTER — Encounter (HOSPITAL_BASED_OUTPATIENT_CLINIC_OR_DEPARTMENT_OTHER): Payer: Self-pay | Admitting: Emergency Medicine

## 2023-08-16 ENCOUNTER — Emergency Department (HOSPITAL_BASED_OUTPATIENT_CLINIC_OR_DEPARTMENT_OTHER)

## 2023-08-16 ENCOUNTER — Other Ambulatory Visit: Payer: Self-pay

## 2023-08-16 DIAGNOSIS — W19XXXA Unspecified fall, initial encounter: Secondary | ICD-10-CM | POA: Insufficient documentation

## 2023-08-16 DIAGNOSIS — M25551 Pain in right hip: Secondary | ICD-10-CM | POA: Diagnosis present

## 2023-08-16 DIAGNOSIS — T148XXA Other injury of unspecified body region, initial encounter: Secondary | ICD-10-CM

## 2023-08-16 DIAGNOSIS — Z7901 Long term (current) use of anticoagulants: Secondary | ICD-10-CM | POA: Insufficient documentation

## 2023-08-16 NOTE — Discharge Instructions (Addendum)
 You can try alternating warm and cold compresses for 20 minutes at a time. Recheck with your primary care provider. Return to the ER for worsening or concerning symptoms.

## 2023-08-16 NOTE — ED Triage Notes (Signed)
 Pt in personal wheelchair with daughter- daughter reports pt recently seen Monday post fall last Thursday.  Daughter reports xray of hip at that time, which was neg.  Daughter reports ongoing R hip pain, swelling?  Pt takes eliquis , no new falls.

## 2023-08-16 NOTE — ED Notes (Signed)
Called in WR, no answer.

## 2023-08-16 NOTE — ED Provider Notes (Signed)
 [place] [facility] Provider Note   CSN: [id number] Arrival date & time: 08/16/23  1055     Patient presents with: Hip Pain   Erika [name] is a 89 y.o. female.   89 year old female brought in by daughter with concern for right hip pain after fall on 08/08/2023.  Patient did not actually fall to the ground, she was transferring to her wheelchair when she hit her right hip on the metal arm of the wheelchair resulting in pain.  Patient came to the ER on 08/12/2023 with pain in the right hip area, had an x-ray that was negative for fracture.  Patient continued to have pain and daughter noted a and the asymmetry over her right hip area and was concerned.  Called PCP who directed to the ER for CT scan.  Patient is on Eliquis , no other falls or injuries.       Prior to Admission medications   Medication Sig Start Date End Date Taking? Authorizing Provider  albuterol (VENTOLIN HFA) 108 (90 Base) MCG/ACT inhaler Inhale 2 puffs into the lungs as needed. 06/15/21   [provider]  amLODipine  (NORVASC ) 10 MG tablet TAKE 1 TABLET BY MOUTH EVERY DAY 03/21/20   Lonni Slain, MD  buPROPion (WELLBUTRIN XL) 150 MG 24 hr tablet Take 150 mg by mouth every morning. 03/20/20   [provider]  desloratadine (CLARINEX) 5 MG tablet Take 5 mg by mouth in the morning.    [provider]  ELIQUIS  5 MG TABS tablet TAKE 1 TABLET BY MOUTH TWICE A DAY Patient taking differently: Take 5 mg by mouth 2 (two) times daily. 05/25/19   Lonni Slain, MD  ergocalciferol (VITAMIN D2) 50000 UNITS capsule Take 50,000 Units by mouth every Thursday.     [provider]  famotidine (PEPCID) 40 MG tablet Take 40 mg by mouth daily. 08/08/21   [provider]  furosemide  (LASIX ) 40 MG tablet Take 20 mg by mouth daily.    [provider]  hydrALAZINE  (APRESOLINE ) 25 MG tablet TAKE 3 TABLETS BY MOUTH 2 TIMES DAILY. 04/08/23    Lonni Slain, MD  HYDROcodone -acetaminophen  (NORCO) 10-325 MG tablet Take 0.5-1 tablets by mouth 2 (two) times daily as needed (for pain).  10/12/19   [provider]  hydrOXYzine (ATARAX/VISTARIL) 10 MG tablet Take 20 mg by mouth daily as needed. 02/24/20   [provider]  ofloxacin (OCUFLOX) 0.3 % ophthalmic solution Place 1 drop into both eyes 2 (two) times daily as needed. 06/12/21   [provider]  SYNTHROID  137 MCG tablet Take 137 mcg by mouth daily. 08/10/21   [provider]    Allergies: Celecoxib, Amiodarone, Dronedarone hydrochloride, Ezetimibe, Flecainide acetate, Lisinopril , Namenda  [memantine  hcl], and Sulfonamide derivatives    Review of Systems Negative except as per HPI Updated Vital Signs BP (!) 168/47   Pulse 61   Temp 97.7 F (36.5 C) (Oral)   Resp 15   SpO2 100%   Physical Exam Vitals and nursing note reviewed.  Constitutional:      General: She is not in acute distress.    Appearance: She is well-developed. She is not diaphoretic.  HENT:     Head: Normocephalic and atraumatic.  Cardiovascular:     Pulses: Normal pulses.  Pulmonary:     Effort: Pulmonary effort is normal.  Musculoskeletal:        General: Swelling, tenderness and signs of injury present.       Legs:  Skin:    General: Skin is warm and dry.     Findings: No bruising or erythema.  Neurological:     Mental Status: She is alert and oriented to person, place, and time.     Sensory: No sensory deficit.  Psychiatric:        Behavior: Behavior normal.     (all labs ordered are listed, but only abnormal results are displayed) Labs Reviewed - No data to display  EKG: None  Radiology: CT PELVIS WO CONTRAST Result Date: 08/16/2023 CLINICAL DATA:  Fall 1 week ago. Right iliac crest pain. Possible hematoma. EXAM: CT PELVIS WITHOUT CONTRAST TECHNIQUE: Multidetector CT imaging of the pelvis was performed following the standard protocol without  intravenous contrast. RADIATION DOSE REDUCTION: This exam was performed according to the departmental dose-optimization program which includes automated exposure control, adjustment of the mA and/or kV according to patient size and/or use of iterative reconstruction technique. COMPARISON:  X-ray 08/12/2023 FINDINGS: Urinary Tract:  No abnormality visualized. Bowel:  Unremarkable visualized pelvic bowel loops. Vascular/Lymphatic: Calcified plaque over the abdominal aorta which is normal in caliber. No adenopathy. Reproductive:  No mass or other significant abnormality Other: 2.3 cm cyst over the subcutaneous fat abutting the skin in the anterior lower right pelvis likely sebaceous cyst. Musculoskeletal: No acute fracture or dislocation. Complicated fluid collection over the subcutaneous tissues of the right lateral gluteal region just lateral to the iliac crest measuring approximately 4.2 x 2.8 x 4.6 cm in AP, transverse and craniocaudal dimensions compatible with a hematoma due to patient's recent trauma. Degenerative changes of the lower lumbar spine with moderate disc space narrowing at the L4-5 and L5-S1 levels. IMPRESSION: 1. No acute fracture or dislocation. 2. 4.2 x 2.8 x 4.6 cm complicated fluid collection over the subcutaneous tissues of the right lateral gluteal region compatible with a hematoma due to patient's recent trauma. 3. Aortic atherosclerosis. 4. Degenerative changes of the lower lumbar spine with moderate disc space narrowing at the L4-5 and L5-S1 levels. Aortic Atherosclerosis (ICD10-I70.0). Electronically Signed   By: Toribio Agreste M.D.   On: 08/16/2023 12:45     Procedures   Medications Ordered in the ED - No data to display                                  Medical Decision Making Amount and/or Complexity of Data Reviewed Radiology: ordered.   89 year old female brought in by daughter from home with concern for right hip pain after fall a week ago.  Daughter is concerned that  her hip bone is sticking out.  Patient is on Eliquis , the fall that she had was as result of transferring, went to sit in her wheelchair and missed, striking her right hip area on the armrest of the wheelchair.  She did not actually fall to the ground. On exam, she has what appears to be a hematoma near the right iliac crest, this is very tender to the touch, no overlying ecchymosis or erythema, the area is firm.  She has normal range of motion of the right hip. Reviewed x-ray from prior ER visit with patient's daughter.  CT pelvis ordered for further evaluation which shows a complex collection in the area of the right iliac crest consistent with patient's exam.  Agree with radiologist interpretation.  Suspect this is a hematoma.  Discussed expected course with patient and daughter.  Can trial alternating warm and cold compresses  although hematomas can take a long time to resolve.  Encouraged to follow-up with primary care provider and return as needed.     Final diagnoses:  Hematoma    ED Discharge Orders     None          Beverley Leita DELENA DEVONNA 08/16/23 1315    Armenta Canning, MD 08/22/23 [phone]

## 2023-08-16 NOTE — ED Notes (Signed)
 Discharge paperwork reviewed entirely with patient, including follow up care. Pain was under control. No prescriptions were called in, but all questions were addressed.  Pt verbalized understanding as well as all parties involved. No questions or concerns voiced at the time of discharge. No acute distress noted. Pt was encouraged to stay adequately hydrated and eat a healthy diet.   Pt was wheeled out to the PVA in a wheelchair without incident.  The patient's guardian will handle all followup on their behalf.   The pt was instructed to set up and/or review MyChart for their results; and was informed their Providers all have access to the information as well.
